# Patient Record
Sex: Female | Born: 1975 | Race: Black or African American | Hispanic: No | Marital: Married | Smoking: Never smoker
Health system: Southern US, Community
[De-identification: ages and names within clinical notes are randomized; demographics above are authoritative.]

## PROBLEM LIST (undated history)

## (undated) ENCOUNTER — Inpatient Hospital Stay (HOSPITAL_COMMUNITY): Payer: Self-pay

## (undated) DIAGNOSIS — G8929 Other chronic pain: Secondary | ICD-10-CM

## (undated) DIAGNOSIS — A6000 Herpesviral infection of urogenital system, unspecified: Secondary | ICD-10-CM

## (undated) DIAGNOSIS — G709 Myoneural disorder, unspecified: Secondary | ICD-10-CM

## (undated) DIAGNOSIS — D649 Anemia, unspecified: Secondary | ICD-10-CM

## (undated) DIAGNOSIS — O24419 Gestational diabetes mellitus in pregnancy, unspecified control: Secondary | ICD-10-CM

## (undated) DIAGNOSIS — E119 Type 2 diabetes mellitus without complications: Secondary | ICD-10-CM

## (undated) DIAGNOSIS — M1990 Unspecified osteoarthritis, unspecified site: Secondary | ICD-10-CM

## (undated) DIAGNOSIS — O1490 Unspecified pre-eclampsia, unspecified trimester: Secondary | ICD-10-CM

## (undated) DIAGNOSIS — G971 Other reaction to spinal and lumbar puncture: Secondary | ICD-10-CM

## (undated) DIAGNOSIS — G629 Polyneuropathy, unspecified: Secondary | ICD-10-CM

## (undated) DIAGNOSIS — O139 Gestational [pregnancy-induced] hypertension without significant proteinuria, unspecified trimester: Secondary | ICD-10-CM

## (undated) DIAGNOSIS — T7840XA Allergy, unspecified, initial encounter: Secondary | ICD-10-CM

## (undated) HISTORY — DX: Type 2 diabetes mellitus without complications: E11.9

## (undated) HISTORY — DX: Myoneural disorder, unspecified: G70.9

## (undated) HISTORY — DX: Unspecified osteoarthritis, unspecified site: M19.90

## (undated) HISTORY — DX: Other reaction to spinal and lumbar puncture: G97.1

## (undated) HISTORY — DX: Allergy, unspecified, initial encounter: T78.40XA

## (undated) HISTORY — PX: DILATION AND CURETTAGE OF UTERUS: SHX78

## (undated) HISTORY — DX: Unspecified pre-eclampsia, unspecified trimester: O14.90

---
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
## 1998-10-20 ENCOUNTER — Emergency Department (HOSPITAL_COMMUNITY): Admission: EM | Admit: 1998-10-20 | Discharge: 1998-10-21 | Payer: Self-pay

## 1999-09-27 ENCOUNTER — Emergency Department (HOSPITAL_COMMUNITY): Admission: EM | Admit: 1999-09-27 | Discharge: 1999-09-27 | Payer: Self-pay | Admitting: Emergency Medicine

## 1999-09-28 ENCOUNTER — Emergency Department (HOSPITAL_COMMUNITY): Admission: EM | Admit: 1999-09-28 | Discharge: 1999-09-28 | Payer: Self-pay | Admitting: Emergency Medicine

## 2005-02-19 ENCOUNTER — Other Ambulatory Visit: Admission: RE | Admit: 2005-02-19 | Discharge: 2005-02-19 | Payer: Self-pay | Admitting: Family Medicine

## 2007-09-06 ENCOUNTER — Emergency Department (HOSPITAL_COMMUNITY): Admission: EM | Admit: 2007-09-06 | Discharge: 2007-09-06 | Payer: Self-pay | Admitting: Emergency Medicine

## 2009-08-12 ENCOUNTER — Emergency Department (HOSPITAL_COMMUNITY): Admission: EM | Admit: 2009-08-12 | Discharge: 2009-08-12 | Payer: Self-pay | Admitting: Emergency Medicine

## 2009-08-31 ENCOUNTER — Emergency Department (HOSPITAL_COMMUNITY): Admission: EM | Admit: 2009-08-31 | Discharge: 2009-08-31 | Payer: Self-pay | Admitting: Emergency Medicine

## 2011-03-09 LAB — URINALYSIS, ROUTINE W REFLEX MICROSCOPIC
Bilirubin Urine: NEGATIVE
Ketones, ur: NEGATIVE mg/dL
Protein, ur: NEGATIVE mg/dL
Urobilinogen, UA: 0.2 mg/dL (ref 0.0–1.0)

## 2011-03-09 LAB — GC/CHLAMYDIA PROBE AMP, GENITAL: GC Probe Amp, Genital: NEGATIVE

## 2011-03-09 LAB — WET PREP, GENITAL
WBC, Wet Prep HPF POC: NONE SEEN
Yeast Wet Prep HPF POC: NONE SEEN

## 2011-09-05 ENCOUNTER — Emergency Department (HOSPITAL_BASED_OUTPATIENT_CLINIC_OR_DEPARTMENT_OTHER)
Admission: EM | Admit: 2011-09-05 | Discharge: 2011-09-05 | Disposition: A | Discharge status: Home / Self Care | Payer: Self-pay | Attending: Emergency Medicine | Admitting: Emergency Medicine

## 2011-09-05 ENCOUNTER — Encounter: Payer: Self-pay | Admitting: *Deleted

## 2011-09-05 DIAGNOSIS — T63481A Toxic effect of venom of other arthropod, accidental (unintentional), initial encounter: Secondary | ICD-10-CM

## 2011-09-05 DIAGNOSIS — T63461A Toxic effect of venom of wasps, accidental (unintentional), initial encounter: Secondary | ICD-10-CM | POA: Insufficient documentation

## 2011-09-05 DIAGNOSIS — T6391XA Toxic effect of contact with unspecified venomous animal, accidental (unintentional), initial encounter: Secondary | ICD-10-CM | POA: Insufficient documentation

## 2011-09-05 MED ORDER — PREDNISONE 10 MG PO TABS: ORAL_TABLET | ORAL | Status: DC

## 2011-09-05 MED ORDER — DIPHENHYDRAMINE HCL 50 MG PO CAPS
50.0000 mg | ORAL_CAPSULE | Freq: Once | ORAL | Status: AC
  Administered 2011-09-05: 50 mg via ORAL
  Filled 2011-09-05: qty 1

## 2011-09-05 MED ORDER — PREDNISONE 50 MG PO TABS
60.0000 mg | ORAL_TABLET | Freq: Every day | ORAL | Status: DC
  Administered 2011-09-05: 60 mg via ORAL

## 2011-09-05 NOTE — ED Provider Notes (Signed)
Medical screening examination/treatment/procedure(s) were performed by non-physician practitioner and as supervising physician I was immediately available for consultation/collaboration.   Glynn Octave, MD 09/05/11 2224

## 2011-09-05 NOTE — ED Provider Notes (Signed)
History     CSN: [id number] Arrival date & time: 09/05/2011  5:56 PM  Chief Complaint  Patient presents with  . Insect Bite    (Consider location/radiation/quality/duration/timing/severity/associated sxs/prior treatment) Patient is a 35 y.o. female presenting with neck injury. The history is provided by the patient. No language interpreter was used.  Neck Injury This is a new problem. The current episode started today. The problem occurs constantly. The problem has been unchanged. Associated symptoms include a rash. The symptoms are aggravated by nothing. She has tried nothing for the symptoms. The treatment provided no relief.  Pt was stung by a bee.  Pt has a knot on her neck.  Pt complains of a swollen area to right neck,  Pt reports she was stung by a bee in the past and had swelling to left knee.  History reviewed. No pertinent past medical history.  History reviewed. No pertinent past surgical history.  History reviewed. No pertinent family history.  History  Substance Use Topics  . Smoking status: Never Smoker   . Smokeless tobacco: Not on file  . Alcohol Use: No    OB History    Grav Para Term Preterm Abortions TAB SAB Ect Mult Living                  Review of Systems  Skin: Positive for rash.  All other systems reviewed and are negative.    Allergies  Penicillins  Home Medications   Current Outpatient Rx  Name Route Sig Dispense Refill  . BIOTIN 5000 MCG PO CAPS Oral Take 1 capsule by mouth daily.        BP 137/82  Temp 98.5 F (36.9 C)  Resp 16  Ht 5\' 7"  (1.702 m)  Wt 223 lb (101.152 kg)  BMI 34.93 kg/m2  SpO2 100%  LMP 09/05/2011  Physical Exam  Nursing note and vitals reviewed. Constitutional: She is oriented to person, place, and time. She appears well-developed and well-nourished.  HENT:  Head: Normocephalic.  Eyes: Pupils are equal, round, and reactive to light.  Neck: Normal range of motion.  Cardiovascular: Normal rate.     Pulmonary/Chest: Effort normal.  Abdominal: Soft.  Musculoskeletal: Normal range of motion.  Neurological: She is alert and oriented to person, place, and time.  Skin:       1 x2 cm area of erythema to right neck,   Psychiatric: She has a normal mood and affect.    ED Course  Procedures (including critical care time)  Labs Reviewed - No data to display No results found.   No diagnosis found.    MDM  Pt given benadryl and prednisone,  Pt observed x 1 hour.  Pt feels well,  No rash, no shortness of breath.  No throat swelling.        Langston Masker, [place] 09/05/11 1925  Langston Masker, [place] 09/05/11 [phone]

## 2011-09-05 NOTE — ED Notes (Signed)
Pt c/o bee sting to neck x 10 mins ago

## 2011-09-13 LAB — URINE MICROSCOPIC-ADD ON

## 2011-09-13 LAB — GC/CHLAMYDIA PROBE AMP, GENITAL
Chlamydia, DNA Probe: POSITIVE — AB
GC Probe Amp, Genital: NEGATIVE

## 2011-09-13 LAB — URINALYSIS, ROUTINE W REFLEX MICROSCOPIC
Glucose, UA: NEGATIVE
Specific Gravity, Urine: 1.029
pH: 6

## 2011-09-13 LAB — WET PREP, GENITAL
Clue Cells Wet Prep HPF POC: NONE SEEN
Trich, Wet Prep: NONE SEEN
Yeast Wet Prep HPF POC: NONE SEEN

## 2011-09-13 LAB — RPR: RPR Ser Ql: NONREACTIVE

## 2012-01-23 ENCOUNTER — Encounter (HOSPITAL_BASED_OUTPATIENT_CLINIC_OR_DEPARTMENT_OTHER): Payer: Self-pay | Admitting: Emergency Medicine

## 2012-01-23 ENCOUNTER — Emergency Department (HOSPITAL_BASED_OUTPATIENT_CLINIC_OR_DEPARTMENT_OTHER)
Admission: EM | Admit: 2012-01-23 | Discharge: 2012-01-23 | Disposition: A | Discharge status: Home Health | Payer: Self-pay | Attending: Emergency Medicine | Admitting: Emergency Medicine

## 2012-01-23 DIAGNOSIS — B9789 Other viral agents as the cause of diseases classified elsewhere: Secondary | ICD-10-CM | POA: Insufficient documentation

## 2012-01-23 DIAGNOSIS — B349 Viral infection, unspecified: Secondary | ICD-10-CM

## 2012-01-23 DIAGNOSIS — R112 Nausea with vomiting, unspecified: Secondary | ICD-10-CM | POA: Insufficient documentation

## 2012-01-23 LAB — RAPID STREP SCREEN (MED CTR MEBANE ONLY): Streptococcus, Group A Screen (Direct): NEGATIVE

## 2012-01-23 MED ORDER — ONDANSETRON 8 MG PO TBDP: 8.0000 mg | ORAL_TABLET | Freq: Three times a day (TID) | ORAL | Status: AC | PRN

## 2012-01-23 MED ORDER — ONDANSETRON 8 MG PO TBDP
8.0000 mg | ORAL_TABLET | Freq: Once | ORAL | Status: AC
  Administered 2012-01-23: 8 mg via ORAL
  Filled 2012-01-23: qty 1

## 2012-01-23 NOTE — ED Notes (Signed)
Generalized body aches, sore throat, lightheadedness, fever, N/V/D.  Nasal congestion, dry cough.  Poor appetite, can keep most fluids down.

## 2012-01-23 NOTE — ED Provider Notes (Signed)
History     CSN: [id number]  Arrival date & time 01/23/12  1241   First MD Initiated Contact with Patient 01/23/12 1301      Chief Complaint  Patient presents with  . Influenza    (Consider location/radiation/quality/duration/timing/severity/associated sxs/prior treatment) HPI Patient states that she has had generalized body aches, sore throat, lightheadedness, fever, nausea, vomiting, diarrhea, nasal congestion, and cough for 2 days. She has had difficulty keeping down solid foods but has been able to keep down fluids. She has not had any known fever but has had some chills.     History reviewed. No pertinent past medical history.  History reviewed. No pertinent past surgical history.  History reviewed. No pertinent family history.  History  Substance Use Topics  . Smoking status: Never Smoker   . Smokeless tobacco: Not on file  . Alcohol Use: No    OB History    Grav Para Term Preterm Abortions TAB SAB Ect Mult Living                  Review of Systems  All other systems reviewed and are negative.    Allergies  Penicillins  Home Medications   Current Outpatient Rx  Name Route Sig Dispense Refill  . BIOTIN 5000 MCG PO CAPS Oral Take 1 capsule by mouth daily.      Marland Kitchen PREDNISONE 10 MG PO TABS  6 tablets a day x 3 days. 15 tablet 0    BP 124/84  Pulse 73  Temp(Src) 98.3 F (36.8 C) (Oral)  Resp 16  Ht 5\' 7"  (1.702 m)  Wt 230 lb (104.327 kg)  BMI 36.02 kg/m2  SpO2 99%  LMP 01/19/2012  Physical Exam  Nursing note and vitals reviewed. Constitutional: She is oriented to person, place, and time. She appears well-developed and well-nourished.  HENT:  Head: Normocephalic and atraumatic.  Right Ear: External ear normal.  Left Ear: External ear normal.  Nose: Nose normal.       Bilateral pleural tonsillar swelling with some erythema  Eyes: Conjunctivae and EOM are normal. Pupils are equal, round, and reactive to light.  Neck: Normal range of motion.  Neck supple.  Cardiovascular: Normal rate and regular rhythm.   Pulmonary/Chest: Effort normal and breath sounds normal.  Abdominal: Soft.  Musculoskeletal: Normal range of motion.  Neurological: She is alert and oriented to person, place, and time.  Skin: Skin is warm and dry.  Psychiatric: She has a normal mood and affect.    ED Course  Procedures (including critical care time)   Labs Reviewed  RAPID STREP SCREEN   No results found.   No diagnosis found.    MDM  Patient was negative strep. The be treated for viral syndrome with symptomatic treatment.        Hilario Quarry, MD 01/23/12 1350

## 2012-01-23 NOTE — Discharge Instructions (Signed)
Antibiotic Nonuse  Your caregiver felt that the infection or problem was not one that would be helped with an antibiotic. Infections may be caused by viruses or bacteria. Only a caregiver can tell which one of these is the likely cause of an illness. A cold is the most common cause of infection in both adults and children. A cold is a virus. Antibiotic treatment will have no effect on a viral infection. Viruses can lead to many lost days of work caring for sick children and many missed days of school. Children may catch as many as 10 "colds" or "flus" per year during which they can be tearful, cranky, and uncomfortable. The goal of treating a virus is aimed at keeping the ill person comfortable. Antibiotics are medications used to help the body fight bacterial infections. There are relatively few types of bacteria that cause infections but there are hundreds of viruses. While both viruses and bacteria cause infection they are very different types of germs. A viral infection will typically go away by itself within 7 to 10 days. Bacterial infections may spread or get worse without antibiotic treatment. Examples of bacterial infections are:  Sore throats (like strep throat or tonsillitis).   Infection in the lung (pneumonia).   Ear and skin infections.  Examples of viral infections are:  Colds or flus.   Most coughs and bronchitis.   Sore throats not caused by Strep.   Runny noses.  It is often best not to take an antibiotic when a viral infection is the cause of the problem. Antibiotics can kill off the helpful bacteria that we have inside our body and allow harmful bacteria to start growing. Antibiotics can cause side effects such as allergies, nausea, and diarrhea without helping to improve the symptoms of the viral infection. Additionally, repeated uses of antibiotics can cause bacteria inside of our body to become resistant. That resistance can be passed onto harmful bacterial. The next time  you have an infection it may be harder to treat if antibiotics are used when they are not needed. Not treating with antibiotics allows our own immune system to develop and take care of infections more efficiently. Also, antibiotics will work better for us when they are prescribed for bacterial infections. Treatments for a child that is ill may include:  Give extra fluids throughout the day to stay hydrated.   Get plenty of rest.   Only give your child over-the-counter or prescription medicines for pain, discomfort, or fever as directed by your caregiver.   The use of a cool mist humidifier may help stuffy noses.   Cold medications if suggested by your caregiver.  Your caregiver may decide to start you on an antibiotic if:  The problem you were seen for today continues for a longer length of time than expected.   You develop a secondary bacterial infection.  SEEK MEDICAL CARE IF:  Fever lasts longer than 5 days.   Symptoms continue to get worse after 5 to 7 days or become severe.   Difficulty in breathing develops.   Signs of dehydration develop (poor drinking, rare urinating, dark colored urine).   Changes in behavior or worsening tiredness (listlessness or lethargy).  Document Released: 01/28/2002 Document Revised: 08/01/2011 Document Reviewed: 07/27/2009 [employer] Patient Information 2012 [employer].Viral Infections A viral infection can be caused by different types of viruses.Most viral infections are not serious and resolve on their own. However, some infections may cause severe symptoms and may lead to further complications. SYMPTOMS Viruses   can frequently cause:  Minor sore throat.   Aches and pains.   Headaches.   Runny nose.   Different types of rashes.   Watery eyes.   Tiredness.   Cough.   Loss of appetite.   Gastrointestinal infections, resulting in nausea, vomiting, and diarrhea.  These symptoms do not respond to antibiotics because the infection  is not caused by bacteria. However, you might catch a bacterial infection following the viral infection. This is sometimes called a "superinfection." Symptoms of such a bacterial infection may include:  Worsening sore throat with pus and difficulty swallowing.   Swollen neck glands.   Chills and a high or persistent fever.   Severe headache.   Tenderness over the sinuses.   Persistent overall ill feeling (malaise), muscle aches, and tiredness (fatigue).   Persistent cough.   Yellow, green, or brown mucus production with coughing.  HOME CARE INSTRUCTIONS   Only take over-the-counter or prescription medicines for pain, discomfort, diarrhea, or fever as directed by your caregiver.   Drink enough water and fluids to keep your urine clear or pale yellow. Sports drinks can provide valuable electrolytes, sugars, and hydration.   Get plenty of rest and maintain proper nutrition. Soups and broths with crackers or rice are fine.  SEEK IMMEDIATE MEDICAL CARE IF:   You have severe headaches, shortness of breath, chest pain, neck pain, or an unusual rash.   You have uncontrolled vomiting, diarrhea, or you are unable to keep down fluids.   You or your child has an oral temperature above 102 F (38.9 C), not controlled by medicine.   Your baby is older than 3 months with a rectal temperature of 102 F (38.9 C) or higher.   Your baby is 3 months old or younger with a rectal temperature of 100.4 F (38 C) or higher.  MAKE SURE YOU:   Understand these instructions.   Will watch your condition.   Will get help right away if you are not doing well or get worse.  Document Released: 08/29/2005 Document Revised: 08/01/2011 Document Reviewed: 03/26/2011 [employer] Patient Information 2012 [employer]. 

## 2012-03-06 ENCOUNTER — Emergency Department (HOSPITAL_BASED_OUTPATIENT_CLINIC_OR_DEPARTMENT_OTHER)
Admission: EM | Admit: 2012-03-06 | Discharge: 2012-03-06 | Disposition: A | Discharge status: Home / Self Care | Payer: Self-pay | Attending: Emergency Medicine | Admitting: Emergency Medicine

## 2012-03-06 ENCOUNTER — Encounter (HOSPITAL_BASED_OUTPATIENT_CLINIC_OR_DEPARTMENT_OTHER): Payer: Self-pay

## 2012-03-06 DIAGNOSIS — J329 Chronic sinusitis, unspecified: Secondary | ICD-10-CM | POA: Insufficient documentation

## 2012-03-06 MED ORDER — SULFAMETHOXAZOLE-TRIMETHOPRIM 800-160 MG PO TABS: 1.0000 | ORAL_TABLET | Freq: Two times a day (BID) | ORAL | Status: AC

## 2012-03-06 NOTE — Discharge Instructions (Signed)

## 2012-03-06 NOTE — ED Notes (Signed)
Patient states that she has had head congestion with sinus pressure, runny nose, headache. Nasal discharge orange in color with streaks of blood. Patient has tried tussin with mucus and cough relief with no relief. Patient states that she has shortness of breath all the time.

## 2012-03-06 NOTE — ED Provider Notes (Signed)
History     CSN: [id number]  Arrival date & time 03/06/12  1055   First MD Initiated Contact with Patient 03/06/12 1158      Chief Complaint  Patient presents with  . Nasal Congestion    (Consider location/radiation/quality/duration/timing/severity/associated sxs/prior treatment) HPI Comments: Pt states that she feels sob but it is in her throat:pt states that has tried otc medications without relief  Patient is a 36 y.o. female presenting with URI. The history is provided by the patient and the spouse. No language interpreter was used.  URI The primary symptoms include headaches and cough. Primary symptoms do not include fever. The current episode started 6 to 7 days ago. This is a new problem. The problem has not changed since onset. Symptoms associated with the illness include facial pain, sinus pressure and congestion.    History reviewed. No pertinent past medical history.  History reviewed. No pertinent past surgical history.  History reviewed. No pertinent family history.  History  Substance Use Topics  . Smoking status: Never Smoker   . Smokeless tobacco: Not on file  . Alcohol Use: Yes     socially    OB History    Grav Para Term Preterm Abortions TAB SAB Ect Mult Living                  Review of Systems  Constitutional: Negative.  Negative for fever.  HENT: Positive for congestion and sinus pressure.   Eyes: Negative.   Respiratory: Positive for cough.   Cardiovascular: Negative.   Neurological: Positive for headaches.    Allergies  Penicillins  Home Medications   Current Outpatient Rx  Name Route Sig Dispense Refill  . BIOTIN 5000 MCG PO CAPS Oral Take 1 capsule by mouth daily.      Marland Kitchen PREDNISONE 10 MG PO TABS  6 tablets a day x 3 days. 15 tablet 0  . SULFAMETHOXAZOLE-TRIMETHOPRIM 800-160 MG PO TABS Oral Take 1 tablet by mouth every 12 (twelve) hours. 20 tablet 0    BP 116/81  Pulse 78  Temp(Src) 97.5 F (36.4 C) (Oral)  Resp 20  Ht 5\' 8"   (1.727 m)  Wt 235 lb (106.595 kg)  BMI 35.73 kg/m2  SpO2 100%  LMP 03/04/2012  Physical Exam  Nursing note and vitals reviewed. Constitutional: She appears well-developed and well-nourished.  HENT:  Right Ear: External ear normal.  Left Ear: External ear normal.  Nose: Mucosal edema present. Left sinus exhibits frontal sinus tenderness.  Mouth/Throat: Posterior oropharyngeal erythema present.  Eyes: EOM are normal.  Neck: Neck supple.  Cardiovascular: Normal rate and regular rhythm.   Pulmonary/Chest: Effort normal and breath sounds normal.    ED Course  Procedures (including critical care time)  Labs Reviewed - No data to display No results found.   1. Sinusitis       MDM  Will treat for sinusitis       Teressa Lower, NP 03/06/12 1230

## 2012-03-06 NOTE — ED Provider Notes (Signed)
Medical screening examination/treatment/procedure(s) were performed by non-physician practitioner and as supervising physician I was immediately available for consultation/collaboration.   Arneta Mahmood, MD 03/06/12 1626 

## 2012-06-19 ENCOUNTER — Emergency Department (HOSPITAL_BASED_OUTPATIENT_CLINIC_OR_DEPARTMENT_OTHER)
Admission: EM | Admit: 2012-06-19 | Discharge: 2012-06-19 | Disposition: A | Discharge status: Home / Self Care | Payer: Self-pay | Attending: Emergency Medicine | Admitting: Emergency Medicine

## 2012-06-19 ENCOUNTER — Encounter (HOSPITAL_BASED_OUTPATIENT_CLINIC_OR_DEPARTMENT_OTHER): Payer: Self-pay | Admitting: Emergency Medicine

## 2012-06-19 DIAGNOSIS — M545 Low back pain, unspecified: Secondary | ICD-10-CM | POA: Insufficient documentation

## 2012-06-19 DIAGNOSIS — N390 Urinary tract infection, site not specified: Secondary | ICD-10-CM

## 2012-06-19 DIAGNOSIS — M549 Dorsalgia, unspecified: Secondary | ICD-10-CM

## 2012-06-19 LAB — URINALYSIS, ROUTINE W REFLEX MICROSCOPIC
Bilirubin Urine: NEGATIVE
Nitrite: NEGATIVE
Protein, ur: NEGATIVE mg/dL
Specific Gravity, Urine: 1.026 (ref 1.005–1.030)
Urobilinogen, UA: 0.2 mg/dL (ref 0.0–1.0)

## 2012-06-19 LAB — PREGNANCY, URINE: Preg Test, Ur: NEGATIVE

## 2012-06-19 LAB — URINE MICROSCOPIC-ADD ON

## 2012-06-19 MED ORDER — OXYCODONE-ACETAMINOPHEN 5-325 MG PO TABS: 1.0000 | ORAL_TABLET | ORAL | Status: AC | PRN

## 2012-06-19 MED ORDER — CEPHALEXIN 500 MG PO CAPS: 500.0000 mg | ORAL_CAPSULE | Freq: Four times a day (QID) | ORAL | Status: AC

## 2012-06-19 NOTE — ED Provider Notes (Signed)
History     CSN: [id number]  Arrival date & time 06/19/12  1545   First MD Initiated Contact with Patient 06/19/12 1633    room 6 [facility]  Chief Complaint  Patient presents with  . Back Pain    (Consider location/radiation/quality/duration/timing/severity/associated sxs/prior treatment) HPI  Patient with sharp low back pain bilateral back for two days.  Occurs daily, last hours,  Sharp with some radiation.  No change with movement, no injury.  Denies uti symptoms, no fever, chills, or vomiting.  Patient had normal menstrual period last week.   History reviewed. No pertinent past medical history.  History reviewed. No pertinent past surgical history.  No family history on file.  History  Substance Use Topics  . Smoking status: Never Smoker   . Smokeless tobacco: Not on file  . Alcohol Use: Yes     socially    OB History    Grav Para Term Preterm Abortions TAB SAB Ect Mult Living                  Review of Systems  All other systems reviewed and are negative.    Allergies  Penicillins  Home Medications   Current Outpatient Rx  Name Route Sig Dispense Refill  . BIOTIN 5000 MCG PO CAPS Oral Take 1 capsule by mouth daily.      Marland Kitchen PREDNISONE 10 MG PO TABS  6 tablets a day x 3 days. 15 tablet 0    BP 119/68  Pulse 83  Temp 97.8 F (36.6 C) (Oral)  Resp 16  SpO2 100%  LMP 06/11/2012  Physical Exam  Nursing note and vitals reviewed. Constitutional: She appears well-developed and well-nourished.  HENT:  Head: Normocephalic and atraumatic.  Eyes: Conjunctivae and EOM are normal. Pupils are equal, round, and reactive to light.  Neck: Normal range of motion. Neck supple.  Cardiovascular: Normal rate, regular rhythm, normal heart sounds and intact distal pulses.   Pulmonary/Chest: Effort normal and breath sounds normal.  Abdominal: Soft. Bowel sounds are normal.  Musculoskeletal: Normal range of motion.  Neurological: She is alert.  Skin: Skin is warm and dry.   Psychiatric: She has a normal mood and affect. Thought content normal.    ED Course  Procedures (including critical care time)  Labs Reviewed  URINALYSIS, ROUTINE W REFLEX MICROSCOPIC - Abnormal; Notable for the following:    APPearance CLOUDY (*)     Hgb urine dipstick SMALL (*)     Leukocytes, UA MODERATE (*)     All other components within normal limits  URINE MICROSCOPIC-ADD ON - Abnormal; Notable for the following:    Squamous Epithelial / LPF FEW (*)     Bacteria, UA MANY (*)     All other components within normal limits  PREGNANCY, URINE   No results found.   No diagnosis found.    MDM  Patient with possible uti, plan keflex and pain meds prn.         Hilario Quarry, MD 06/19/12 [phone]

## 2012-06-19 NOTE — ED Notes (Signed)
Pt c/o sharp shooting pain from lower back, radiating to neck at times since last pm

## 2012-10-11 ENCOUNTER — Encounter (HOSPITAL_BASED_OUTPATIENT_CLINIC_OR_DEPARTMENT_OTHER): Payer: Self-pay | Admitting: *Deleted

## 2012-10-11 ENCOUNTER — Emergency Department (HOSPITAL_BASED_OUTPATIENT_CLINIC_OR_DEPARTMENT_OTHER)
Admission: EM | Admit: 2012-10-11 | Discharge: 2012-10-11 | Disposition: A | Discharge status: Home / Self Care | Payer: Self-pay | Attending: Emergency Medicine | Admitting: Emergency Medicine

## 2012-10-11 DIAGNOSIS — M25549 Pain in joints of unspecified hand: Secondary | ICD-10-CM | POA: Insufficient documentation

## 2012-10-11 DIAGNOSIS — M79643 Pain in unspecified hand: Secondary | ICD-10-CM

## 2012-10-11 DIAGNOSIS — Z79899 Other long term (current) drug therapy: Secondary | ICD-10-CM | POA: Insufficient documentation

## 2012-10-11 LAB — GLUCOSE, CAPILLARY: Glucose-Capillary: 91 mg/dL (ref 70–99)

## 2012-10-11 MED ORDER — IBUPROFEN 800 MG PO TABS
800.0000 mg | ORAL_TABLET | Freq: Once | ORAL | Status: AC
  Administered 2012-10-11: 800 mg via ORAL
  Filled 2012-10-11: qty 1

## 2012-10-11 MED ORDER — IBUPROFEN 800 MG PO TABS: 800.0000 mg | ORAL_TABLET | Freq: Three times a day (TID) | ORAL | Status: AC

## 2012-10-11 NOTE — ED Provider Notes (Signed)
History     CSN: [id number]  Arrival date & time 10/11/12  8295   First MD Initiated Contact with Patient 10/11/12 0914      Chief Complaint  Patient presents with  . Hand Pain    (Consider location/radiation/quality/duration/timing/severity/associated sxs/prior treatment) HPI The patient presents with concerns of bilateral thumb dysesthesia.  She states that her symptoms began several days ago, insidiously.  Since onset symptoms of the more pronounced on the right than the left.  She notes that on the palmar aspect of the thumb as she feels off.  The sensation radiates proximally more on the right than the left.  There is no pain sensation and there is no inability to graft or other range of motion limitation.  There is no concurrent fever, chills, other notable complaints. 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  She has had no relief with home OTC medication use.  She states that apply a wrist wrap made her symptoms worse. History reviewed. No pertinent past medical history.  History reviewed. No pertinent past surgical history.  History reviewed. No pertinent family history.  History  Substance Use Topics  . Smoking status: Never Smoker   . Smokeless tobacco: Not on file  . Alcohol Use: Yes     Comment: socially    OB History    Grav Para Term Preterm Abortions TAB SAB Ect Mult Living                  Review of Systems  All other systems reviewed and are negative.    Allergies  Penicillins  Home Medications   Current Outpatient Rx  Name  Route  Sig  Dispense  Refill  . BIOTIN 5000 MCG PO CAPS   Oral   Take 1 capsule by mouth daily.           Marland Kitchen PREDNISONE 10 MG PO TABS      6 tablets a day x 3 days.   15 tablet   0     BP 116/77  Pulse 60  Temp 98.4 F (36.9 C)  Resp 18  SpO2 100%  Physical Exam  Nursing note and vitals reviewed. Constitutional: She appears well-developed and well-nourished. No distress.    HENT:  Head: Normocephalic and atraumatic.  Eyes: Conjunctivae normal are normal. Right eye exhibits no discharge. Left eye exhibits no discharge.  Neck: No tracheal deviation present.  Cardiovascular: Normal rate, regular rhythm and intact distal pulses.   No murmur heard. Pulmonary/Chest: Effort normal and breath sounds normal. No stridor. No respiratory distress.  Musculoskeletal:       Right wrist: She exhibits tenderness.       Arms:      Feet:       Range of motion and strength is appropriate and both wrists and all digits bilaterally.  There is no appreciable deformity, erythema, edema throughout.  Skin: She is not diaphoretic.    ED Course  Procedures (including critical care time)  Labs Reviewed - No data to display No results found.   No diagnosis found.    MDM  This generally well-appearing female presents with days of bilateral thumb and wrist discomfort.  On exam she is in no distress.  The patient's vital signs are unremarkable.  Given some suspicion of neuropathy, a point-of-care glucose test was performed, this was unremarkable.  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  I discussed the findings with the patient.  She was discharged in stable condition on a course of anti-inflammatories, instructions to use cryotherapy.  She was provided resources to assist with primary care followup, for further evaluation and management of her thumb and wrist discomfort.    Gerhard Munch, MD 10/11/12 [phone]

## 2012-10-11 NOTE — ED Notes (Signed)
The patient's CBG was 91.

## 2012-10-11 NOTE — ED Notes (Signed)
Pt presents to ED today with left thumb and hand pain and numbness extending into wrist.  Pt also c/o decreasaed ROM with right wrist  Pt reports no injury bilat

## 2012-11-18 ENCOUNTER — Encounter (HOSPITAL_BASED_OUTPATIENT_CLINIC_OR_DEPARTMENT_OTHER): Payer: Self-pay | Admitting: *Deleted

## 2012-11-18 ENCOUNTER — Emergency Department (HOSPITAL_BASED_OUTPATIENT_CLINIC_OR_DEPARTMENT_OTHER): Payer: Self-pay

## 2012-11-18 ENCOUNTER — Emergency Department (HOSPITAL_BASED_OUTPATIENT_CLINIC_OR_DEPARTMENT_OTHER)
Admission: EM | Admit: 2012-11-18 | Discharge: 2012-11-18 | Disposition: A | Discharge status: Home / Self Care | Payer: Self-pay | Attending: Emergency Medicine | Admitting: Emergency Medicine

## 2012-11-18 DIAGNOSIS — R6889 Other general symptoms and signs: Secondary | ICD-10-CM | POA: Insufficient documentation

## 2012-11-18 DIAGNOSIS — R509 Fever, unspecified: Secondary | ICD-10-CM | POA: Insufficient documentation

## 2012-11-18 DIAGNOSIS — J029 Acute pharyngitis, unspecified: Secondary | ICD-10-CM | POA: Insufficient documentation

## 2012-11-18 DIAGNOSIS — J111 Influenza due to unidentified influenza virus with other respiratory manifestations: Secondary | ICD-10-CM | POA: Insufficient documentation

## 2012-11-18 DIAGNOSIS — A599 Trichomoniasis, unspecified: Secondary | ICD-10-CM | POA: Insufficient documentation

## 2012-11-18 DIAGNOSIS — R110 Nausea: Secondary | ICD-10-CM | POA: Insufficient documentation

## 2012-11-18 LAB — URINALYSIS, ROUTINE W REFLEX MICROSCOPIC
Glucose, UA: NEGATIVE mg/dL
Nitrite: NEGATIVE
Protein, ur: 30 mg/dL — AB

## 2012-11-18 LAB — URINE MICROSCOPIC-ADD ON

## 2012-11-18 MED ORDER — ACETAMINOPHEN 325 MG PO TABS
650.0000 mg | ORAL_TABLET | Freq: Once | ORAL | Status: AC
  Administered 2012-11-18: 650 mg via ORAL

## 2012-11-18 MED ORDER — OSELTAMIVIR PHOSPHATE 75 MG PO CAPS: 75.0000 mg | ORAL_CAPSULE | Freq: Two times a day (BID) | ORAL | Status: DC

## 2012-11-18 MED ORDER — ACETAMINOPHEN 325 MG PO TABS
ORAL_TABLET | ORAL | Status: AC
  Administered 2012-11-18: 650 mg via ORAL
  Filled 2012-11-18: qty 2

## 2012-11-18 MED ORDER — METRONIDAZOLE 500 MG PO TABS: 500.0000 mg | ORAL_TABLET | Freq: Two times a day (BID) | ORAL | Status: DC

## 2012-11-18 NOTE — ED Notes (Signed)
Body aches with naus

## 2012-11-18 NOTE — ED Notes (Signed)
MD at bedside. 

## 2012-11-18 NOTE — ED Notes (Signed)
Patient transported to X-ray 

## 2012-11-18 NOTE — ED Provider Notes (Signed)
History     CSN: [id number]  Arrival date & time 11/18/12  1435   First MD Initiated Contact with Patient 11/18/12 1526      Chief Complaint  Patient presents with  . Generalized Body Aches    (Consider location/radiation/quality/duration/timing/severity/associated sxs/prior treatment) Patient is a 36 y.o. female presenting with flu symptoms. The history is provided by the patient.  Influenza This is a new problem. The current episode started 6 to 12 hours ago. The problem occurs constantly. The problem has been gradually worsening. Pertinent negatives include no chest pain, no abdominal pain and no shortness of breath. Associated symptoms comments: Fever, cough, sore throat, nausea.  No SOB, HA, rash, chest pain or abd pain. Nothing aggravates the symptoms. Nothing relieves the symptoms. She has tried nothing for the symptoms. The treatment provided no relief.    History reviewed. No pertinent past medical history.  History reviewed. No pertinent past surgical history.  History reviewed. No pertinent family history.  History  Substance Use Topics  . Smoking status: Never Smoker   . Smokeless tobacco: Not on file  . Alcohol Use: Yes     Comment: socially    OB History    Grav Para Term Preterm Abortions TAB SAB Ect Mult Living                  Review of Systems  Constitutional: Positive for fever, chills and appetite change.  HENT: Positive for sore throat and sneezing. Negative for congestion, rhinorrhea and trouble swallowing.   Respiratory: Negative for shortness of breath.   Cardiovascular: Negative for chest pain.  Gastrointestinal: Positive for nausea. Negative for vomiting, abdominal pain and diarrhea.  Genitourinary: Negative for dysuria and hematuria.  Skin: Negative for rash.  All other systems reviewed and are negative.    Allergies  Penicillins  Home Medications   Current Outpatient Rx  Name  Route  Sig  Dispense  Refill  . BIOTIN 5000 MCG PO  CAPS   Oral   Take 1 capsule by mouth daily.             BP 112/73  Pulse 117  Temp 102.4 F (39.1 C) (Oral)  Resp 24  Ht 5\' 7"  (1.702 m)  Wt 237 lb (107.502 kg)  BMI 37.12 kg/m2  SpO2 99%  Physical Exam  Nursing note and vitals reviewed. Constitutional: She is oriented to person, place, and time. She appears well-developed and well-nourished. No distress.  HENT:  Head: Normocephalic and atraumatic.  Mouth/Throat: Oropharynx is clear and moist.  Eyes: Conjunctivae normal and EOM are normal. Pupils are equal, round, and reactive to light.  Neck: Normal range of motion. Neck supple.  Cardiovascular: Regular rhythm and intact distal pulses.  Tachycardia present.   No murmur heard. Pulmonary/Chest: Effort normal and breath sounds normal. No respiratory distress. She has no wheezes. She has no rales.  Abdominal: Soft. She exhibits no distension. There is no tenderness. There is no rebound and no guarding.  Musculoskeletal: Normal range of motion. She exhibits no edema and no tenderness.  Neurological: She is alert and oriented to person, place, and time.  Skin: Skin is warm and dry. No rash noted. No erythema.  Psychiatric: She has a normal mood and affect. Her behavior is normal.    ED Course  Procedures (including critical care time)  Labs Reviewed  URINALYSIS, ROUTINE W REFLEX MICROSCOPIC - Abnormal; Notable for the following:    Color, Urine AMBER (*)  BIOCHEMICALS MAY BE AFFECTED  BY COLOR   APPearance CLOUDY (*)     Hgb urine dipstick MODERATE (*)     Bilirubin Urine SMALL (*)     Ketones, ur 15 (*)     Protein, ur 30 (*)     Leukocytes, UA MODERATE (*)     All other components within normal limits  URINE MICROSCOPIC-ADD ON - Abnormal; Notable for the following:    Squamous Epithelial / LPF FEW (*)     Bacteria, UA FEW (*)     All other components within normal limits  URINE CULTURE   Dg Chest 2 View  11/18/2012  *RADIOLOGY REPORT*  Clinical Data: Cough and  fever.  Runny nose.  CHEST - 2 VIEW  Comparison: None.  Findings: Cardiomediastinal silhouette is within normal limits. The lungs are free of focal consolidations and pleural effusions. No edema. Visualized osseous structures have a normal appearance.  IMPRESSION: Negative exam.   Original Report Authenticated By: Norva Pavlov, M.D.      No diagnosis found.    MDM   Pt with symptoms consistent with influenza.  Normal exam here but is febrile.  No signs of breathing difficulty  No signs of strep pharyngitis, otitis or abnormal abdominal findings.   CXR wnl.  Will continue antipyretica and rest and fluids and return for any further problems.  4:08 PM UA also shows trich.  Denies dysuria.  Will treat with flagyl       Gwyneth Sprout, MD 11/18/12 [phone]

## 2012-11-19 LAB — URINE CULTURE: Colony Count: 80000

## 2014-01-18 ENCOUNTER — Emergency Department (HOSPITAL_BASED_OUTPATIENT_CLINIC_OR_DEPARTMENT_OTHER)
Admission: EM | Admit: 2014-01-18 | Discharge: 2014-01-18 | Disposition: A | Discharge status: Home / Self Care | Payer: Self-pay | Attending: Emergency Medicine | Admitting: Emergency Medicine

## 2014-01-18 ENCOUNTER — Emergency Department (HOSPITAL_BASED_OUTPATIENT_CLINIC_OR_DEPARTMENT_OTHER): Payer: Self-pay

## 2014-01-18 ENCOUNTER — Encounter (HOSPITAL_BASED_OUTPATIENT_CLINIC_OR_DEPARTMENT_OTHER): Payer: Self-pay | Admitting: Emergency Medicine

## 2014-01-18 DIAGNOSIS — Z880 Allergy status to penicillin: Secondary | ICD-10-CM | POA: Insufficient documentation

## 2014-01-18 DIAGNOSIS — Z862 Personal history of diseases of the blood and blood-forming organs and certain disorders involving the immune mechanism: Secondary | ICD-10-CM | POA: Insufficient documentation

## 2014-01-18 DIAGNOSIS — Z79899 Other long term (current) drug therapy: Secondary | ICD-10-CM | POA: Insufficient documentation

## 2014-01-18 DIAGNOSIS — R0789 Other chest pain: Secondary | ICD-10-CM | POA: Insufficient documentation

## 2014-01-18 HISTORY — DX: Anemia, unspecified: D64.9

## 2014-01-18 LAB — CBC WITH DIFFERENTIAL/PLATELET
Basophils Absolute: 0 10*3/uL (ref 0.0–0.1)
Basophils Relative: 0 % (ref 0–1)
EOS ABS: 0.2 10*3/uL (ref 0.0–0.7)
EOS PCT: 3 % (ref 0–5)
HEMATOCRIT: 36.7 % (ref 36.0–46.0)
Hemoglobin: 12.1 g/dL (ref 12.0–15.0)
LYMPHS ABS: 1.8 10*3/uL (ref 0.7–4.0)
LYMPHS PCT: 27 % (ref 12–46)
MCH: 30.4 pg (ref 26.0–34.0)
MCHC: 33 g/dL (ref 30.0–36.0)
MCV: 92.2 fL (ref 78.0–100.0)
MONO ABS: 0.8 10*3/uL (ref 0.1–1.0)
Monocytes Relative: 12 % (ref 3–12)
Neutro Abs: 3.9 10*3/uL (ref 1.7–7.7)
Neutrophils Relative %: 58 % (ref 43–77)
Platelets: 342 10*3/uL (ref 150–400)
RBC: 3.98 MIL/uL (ref 3.87–5.11)
RDW: 13.2 % (ref 11.5–15.5)
WBC: 6.6 10*3/uL (ref 4.0–10.5)

## 2014-01-18 LAB — COMPREHENSIVE METABOLIC PANEL
ALT: 18 U/L (ref 0–35)
AST: 20 U/L (ref 0–37)
Albumin: 3.9 g/dL (ref 3.5–5.2)
Alkaline Phosphatase: 62 U/L (ref 39–117)
BUN: 10 mg/dL (ref 6–23)
CALCIUM: 9.8 mg/dL (ref 8.4–10.5)
CO2: 25 meq/L (ref 19–32)
CREATININE: 0.7 mg/dL (ref 0.50–1.10)
Chloride: 102 mEq/L (ref 96–112)
GLUCOSE: 108 mg/dL — AB (ref 70–99)
Potassium: 4.3 mEq/L (ref 3.7–5.3)
Sodium: 139 mEq/L (ref 137–147)
Total Bilirubin: 0.5 mg/dL (ref 0.3–1.2)
Total Protein: 8.5 g/dL — ABNORMAL HIGH (ref 6.0–8.3)

## 2014-01-18 LAB — POCT I-STAT TROPONIN I: TROPONIN I, POC: 0.02 ng/mL (ref 0.00–0.08)

## 2014-01-18 NOTE — ED Provider Notes (Signed)
CSN: [id number]     Arrival date & time 01/18/14  0831 History   First MD Initiated Contact with Patient 01/18/14 [phone]     Chief Complaint  Patient presents with  . Shortness of Breath     (Consider location/radiation/quality/duration/timing/severity/associated sxs/prior Treatment) HPI Comments: Patient is a 38 year-old female otherwise healthy who presents with complaints of intermittent chest discomfort and shortness of breath for the past several months. She denies any injury or trauma. She denies any nausea, diaphoresis, radiation to the arm or jaw. She denies any exertional symptoms and has no cardiac risk factors. She does not smoke. She denies any aggravating or alleviating factors.  Patient is a 38 y.o. female presenting with shortness of breath. The history is provided by the patient.  Shortness of Breath Severity:  Moderate Onset quality:  Gradual Duration:  2 months Timing:  Intermittent Progression:  Worsening Chronicity:  New Context: not activity, not occupational exposure and not URI   Relieved by:  Nothing Worsened by:  Nothing tried Ineffective treatments:  None tried Associated symptoms: no abdominal pain, no cough, no fever and no sputum production   Risk factors: no hx of PE/DVT     Past Medical History  Diagnosis Date  . Anemia    History reviewed. No pertinent past surgical history. No family history on file. History  Substance Use Topics  . Smoking status: Never Smoker   . Smokeless tobacco: Not on file  . Alcohol Use: Yes     Comment: socially   OB History   Grav Para Term Preterm Abortions TAB SAB Ect Mult Living                 Review of Systems  Constitutional: Negative for fever.  Respiratory: Positive for shortness of breath. Negative for cough and sputum production.   Gastrointestinal: Negative for abdominal pain.  All other systems reviewed and are negative.      Allergies  Penicillins  Home Medications   Current Outpatient  Rx  Name  Route  Sig  Dispense  Refill  . calcium carbonate (OS-CAL) 600 MG TABS tablet   Oral   Take 600 mg by mouth 2 (two) times daily with a meal.         . Multiple Vitamins-Minerals (WOMENS MULTIVITAMIN PLUS PO)   Oral   Take by mouth.         . Biotin 5000 MCG CAPS   Oral   Take 1 capsule by mouth daily.           . metroNIDAZOLE (FLAGYL) 500 MG tablet   Oral   Take 1 tablet (500 mg total) by mouth 2 (two) times daily.   14 tablet   0   . oseltamivir (TAMIFLU) 75 MG capsule   Oral   Take 1 capsule (75 mg total) by mouth every 12 (twelve) hours.   10 capsule   0    BP 151/95  Pulse 98  Temp(Src) 97.9 F (36.6 C) (Oral)  Resp 16  Ht 5\' 7"  (1.702 m)  Wt 220 lb (99.791 kg)  BMI 34.45 kg/m2  SpO2 100%  LMP 01/17/2014 Physical Exam  Nursing note and vitals reviewed. Constitutional: She is oriented to person, place, and time. She appears well-developed and well-nourished. No distress.  HENT:  Head: Normocephalic and atraumatic.  Mouth/Throat: Oropharynx is clear and moist.  Neck: Normal range of motion. Neck supple.  Cardiovascular: Normal rate and regular rhythm.  Exam reveals no gallop and no  friction rub.   No murmur heard. Pulmonary/Chest: Effort normal and breath sounds normal. No respiratory distress. She has no wheezes. She has no rales. She exhibits tenderness.  There is mild tenderness to palpation of the anterior chest wall and sternum. This seems to reproduce her symptoms.  Abdominal: Soft. Bowel sounds are normal. She exhibits no distension and no mass. There is no tenderness. There is no rebound and no guarding.  Musculoskeletal: Normal range of motion. She exhibits no edema.  Lymphadenopathy:    She has no cervical adenopathy.  Neurological: She is alert and oriented to person, place, and time.  Skin: Skin is warm and dry. She is not diaphoretic.    ED Course  Procedures (including critical care time) Labs Review Labs Reviewed  CBC WITH  DIFFERENTIAL  COMPREHENSIVE METABOLIC PANEL   Imaging Review No results found.  EKG Interpretation    Date/Time:  Monday January 18 2014 08:59:42 EST Ventricular Rate:  63 PR Interval:  150 QRS Duration: 84 QT Interval:  394 QTC Calculation: 403 R Axis:   24 Text Interpretation:  Normal sinus rhythm with sinus arrhythmia Normal ECG Confirmed by DELOS  MD, Benedicta Sultan ([id number]) on 01/18/2014 9:18:11 AM            MDM   Final diagnoses:  None    Patient is a 38 year old female who presents with complaints of intermittent chest pains and shortness of breath for the past several months. Her symptoms come and go. Workup today reveals a normal EKG, laboratory studies, and chest x-ray. Her symptoms sound musculoskeletal in nature and I doubt a cardiac etiology. There is no hypoxia, tachycardia, or tachypnea to suggest pulmonary embolism. At this point I do not feel as though there is an emergent disease process and feel as though she is stable for discharge with ibuprofen and when necessary followup.    Geoffery Lyonsouglas Lynnann Knudsen, MD 01/18/14 1006

## 2014-01-18 NOTE — ED Notes (Signed)
Pt c/o intermittent shortness of breath and chest pain since "October at least". Pt reports shob is worse in the morning but gets better during the day. Pt speaking in complete sentences, nad noted. Pt denies chest pain at present. sts pain usually lasts about 15 mins when present.

## 2014-01-18 NOTE — Discharge Instructions (Signed)
Ibuprofen 600 mg every 6 hours as needed for pain.  Followup with your primary Dr. if not improving in the next week.   Chest Pain (Nonspecific) It is often hard to give a specific diagnosis for the cause of chest pain. There is always a chance that your pain could be related to something serious, such as a heart attack or a blood clot in the lungs. You need to follow up with your caregiver for further evaluation. CAUSES   Heartburn.  Pneumonia or bronchitis.  Anxiety or stress.  Inflammation around your heart (pericarditis) or lung (pleuritis or pleurisy).  A blood clot in the lung.  A collapsed lung (pneumothorax). It can develop suddenly on its own (spontaneous pneumothorax) or from injury (trauma) to the chest.  Shingles infection (herpes zoster virus). The chest wall is composed of bones, muscles, and cartilage. Any of these can be the source of the pain.  The bones can be bruised by injury.  The muscles or cartilage can be strained by coughing or overwork.  The cartilage can be affected by inflammation and become sore (costochondritis). DIAGNOSIS  Lab tests or other studies, such as X-rays, electrocardiography, stress testing, or cardiac imaging, may be needed to find the cause of your pain.  TREATMENT   Treatment depends on what may be causing your chest pain. Treatment may include:  Acid blockers for heartburn.  Anti-inflammatory medicine.  Pain medicine for inflammatory conditions.  Antibiotics if an infection is present.  You may be advised to change lifestyle habits. This includes stopping smoking and avoiding alcohol, caffeine, and chocolate.  You may be advised to keep your head raised (elevated) when sleeping. This reduces the chance of acid going backward from your stomach into your esophagus.  Most of the time, nonspecific chest pain will improve within 2 to 3 days with rest and mild pain medicine. HOME CARE INSTRUCTIONS   If antibiotics were  prescribed, take your antibiotics as directed. Finish them even if you start to feel better.  For the next few days, avoid physical activities that bring on chest pain. Continue physical activities as directed.  Do not smoke.  Avoid drinking alcohol.  Only take over-the-counter or prescription medicine for pain, discomfort, or fever as directed by your caregiver.  Follow your caregiver's suggestions for further testing if your chest pain does not go away.  Keep any follow-up appointments you made. If you do not go to an appointment, you could develop lasting (chronic) problems with pain. If there is any problem keeping an appointment, you must call to reschedule. SEEK MEDICAL CARE IF:   You think you are having problems from the medicine you are taking. Read your medicine instructions carefully.  Your chest pain does not go away, even after treatment.  You develop a rash with blisters on your chest. SEEK IMMEDIATE MEDICAL CARE IF:   You have increased chest pain or pain that spreads to your arm, neck, jaw, back, or abdomen.  You develop shortness of breath, an increasing cough, or you are coughing up blood.  You have severe back or abdominal pain, feel nauseous, or vomit.  You develop severe weakness, fainting, or chills.  You have a fever. THIS IS AN EMERGENCY. Do not wait to see if the pain will go away. Get medical help at once. Call your local emergency services (911 in [place] Do not drive yourself to the hospital. MAKE SURE YOU:   Understand these instructions.  Will watch your condition.  Will get help  right away if you are not doing well or get worse. Document Released: 08/29/2005 Document Revised: 02/11/2012 Document Reviewed: 06/24/2008 [facility] Patient Information 2014 [place].

## 2014-01-20 ENCOUNTER — Encounter (HOSPITAL_BASED_OUTPATIENT_CLINIC_OR_DEPARTMENT_OTHER): Payer: Self-pay | Admitting: Emergency Medicine

## 2014-01-20 ENCOUNTER — Emergency Department (HOSPITAL_BASED_OUTPATIENT_CLINIC_OR_DEPARTMENT_OTHER)
Admission: EM | Admit: 2014-01-20 | Discharge: 2014-01-20 | Disposition: A | Discharge status: Home / Self Care | Payer: Self-pay | Attending: Emergency Medicine | Admitting: Emergency Medicine

## 2014-01-20 ENCOUNTER — Emergency Department (HOSPITAL_BASED_OUTPATIENT_CLINIC_OR_DEPARTMENT_OTHER): Payer: Self-pay

## 2014-01-20 DIAGNOSIS — Z880 Allergy status to penicillin: Secondary | ICD-10-CM | POA: Insufficient documentation

## 2014-01-20 DIAGNOSIS — D649 Anemia, unspecified: Secondary | ICD-10-CM | POA: Insufficient documentation

## 2014-01-20 DIAGNOSIS — E669 Obesity, unspecified: Secondary | ICD-10-CM | POA: Insufficient documentation

## 2014-01-20 DIAGNOSIS — R0789 Other chest pain: Secondary | ICD-10-CM

## 2014-01-20 DIAGNOSIS — Z79899 Other long term (current) drug therapy: Secondary | ICD-10-CM | POA: Insufficient documentation

## 2014-01-20 DIAGNOSIS — R072 Precordial pain: Secondary | ICD-10-CM | POA: Insufficient documentation

## 2014-01-20 LAB — TROPONIN I: Troponin I: <0.3 ng/mL

## 2014-01-20 NOTE — ED Notes (Signed)
Pt c/o chest pain and shob intermittent since October and was seen and treated here for same on Monday. Pt sts pain is now in lower back and left side.

## 2014-01-20 NOTE — ED Notes (Signed)
Pt speaking in complete sentences, nad noted.

## 2014-01-20 NOTE — ED Provider Notes (Signed)
CSN: [id number]     Arrival date & time 01/20/14  1246 History   First MD Initiated Contact with Patient 01/20/14 1503     Chief Complaint  Patient presents with  . Shortness of Breath     (Consider location/radiation/quality/duration/timing/severity/associated sxs/prior Treatment) HPI  This a 38 yo female with no significant past medical history who presents with chest pain shortness of breath. Patient was seen and evaluated on Monday for the same. She reports intermittent chest pain and shortness of breath since October.  On Monday, patient's workup is reassuring and she was instructed to use ibuprofen 600 every 6 hours which she states she's been doing. She states that she's not had improvement of pain. She reports the pain is pressure-like. It starts when she gets up the morning. It is not exertional. It starts in her sternum and goes to under her right breast. It is not associated with food. She denies any pleuritic nature to the pain. Patient also reports shortness of breath mostly in the morning. She has no history of blood clots, birth control use, lower extremity swelling or hospitalization or surgery.  She denies any fevers, congestion.  Past Medical History  Diagnosis Date  . Anemia    History reviewed. No pertinent past surgical history. No family history on file. History  Substance Use Topics  . Smoking status: Never Smoker   . Smokeless tobacco: Not on file  . Alcohol Use: Yes     Comment: socially   OB History   Grav Para Term Preterm Abortions TAB SAB Ect Mult Living                 Review of Systems  Constitutional: Negative for fever.  Respiratory: Positive for chest tightness and shortness of breath. Negative for cough.   Cardiovascular: Positive for chest pain. Negative for palpitations and leg swelling.  Gastrointestinal: Negative for nausea, vomiting and abdominal pain.  Genitourinary: Negative for dysuria.  Musculoskeletal: Negative for back pain.  Skin:  Negative for rash.  Neurological: Negative for headaches.  Psychiatric/Behavioral: Negative for confusion.  All other systems reviewed and are negative.      Allergies  Penicillins  Home Medications   Current Outpatient Rx  Name  Route  Sig  Dispense  Refill  . Biotin 5000 MCG CAPS   Oral   Take 1 capsule by mouth daily.           . calcium carbonate (OS-CAL) 600 MG TABS tablet   Oral   Take 600 mg by mouth 2 (two) times daily with a meal.         . metroNIDAZOLE (FLAGYL) 500 MG tablet   Oral   Take 1 tablet (500 mg total) by mouth 2 (two) times daily.   14 tablet   0   . Multiple Vitamins-Minerals (WOMENS MULTIVITAMIN PLUS PO)   Oral   Take by mouth.         . oseltamivir (TAMIFLU) 75 MG capsule   Oral   Take 1 capsule (75 mg total) by mouth every 12 (twelve) hours.   10 capsule   0    BP 122/102  Pulse 94  Temp(Src) 98.3 F (36.8 C) (Oral)  Resp 18  SpO2 100%  LMP 01/18/2014 Physical Exam  Nursing note and vitals reviewed. Constitutional: She is oriented to person, place, and time. No distress.  Obese  HENT:  Head: Normocephalic and atraumatic.  Mouth/Throat: Oropharynx is clear and moist.  Neck: Neck supple.  Cardiovascular:  Normal rate, regular rhythm and normal heart sounds.   Pulmonary/Chest: Effort normal and breath sounds normal. No respiratory distress. She has no wheezes. She exhibits tenderness.  Tenderness to palpation over the sternum and right breast, no mass  Abdominal: Soft. Bowel sounds are normal. There is no tenderness.  Musculoskeletal: She exhibits edema.  Neurological: She is alert and oriented to person, place, and time.  Skin: Skin is warm and dry.  Psychiatric: She has a normal mood and affect.    ED Course  Procedures (including critical care time) Labs Review Labs Reviewed  TROPONIN I   Imaging Review Dg Chest 2 View  01/20/2014   CLINICAL DATA:  Shortness of Breath  EXAM: CHEST  2 VIEW  COMPARISON:  January 18, 2014  FINDINGS: Lungs are clear. Heart size and pulmonary vascularity are normal. No adenopathy. No bone lesions.  IMPRESSION: No abnormality noted.   Electronically Signed   By: Bretta BangWilliam  Woodruff M.D.   On: 01/20/2014 14:28    EKG Interpretation    Date/Time:  Wednesday January 20 2014 13:07:28 EST Ventricular Rate:  85 PR Interval:  144 QRS Duration: 88 QT Interval:  370 QTC Calculation: 440 R Axis:   29 Text Interpretation:  Normal sinus rhythm Normal ECG No significant change was found Confirmed by Manus GunningANCOUR  MD, STEPHEN ([id number]) on 01/20/2014 1:23:47 PM            MDM   Final diagnoses:  Chest wall pain   Patient presents with shortness of breath and chest pain that has been intermittent since October. It is nonexertional and is reproducible on exam. She has no risk factors for heart disease in this young. Low suspicion for ACS. EKG is normal, chest x-ray and troponin are negative. She is PERC negative.  Patient was encouraged to continue ibuprofen for anti-inflammatory effects. She is only taking ibuprofen for 2 days. [facility] as [facility] if symptoms persist.  After history, exam, and medical workup I feel the patient has been appropriately medically screened and is safe for discharge home. Pertinent diagnoses were discussed with the patient. Patient was given return precautions.     Shon Batonourtney F Logan Vegh, MD 01/20/14 [phone]

## 2014-01-20 NOTE — Discharge Instructions (Signed)
Chest Wall Pain Chest wall pain is pain in or around the bones and muscles of your chest. It may take up to 6 weeks to get better. It may take longer if you must stay physically active in your work and activities.  You were seen today for chest pain. Your pain appears to be musculoskeletal. He need to continue to take ibuprofen for the next 1-2 weeks for full anti-inflammatory effect. Your relatively low risk for heart disease will be referred to cardiology for symptoms persist. See discharge instructions. He should return if he had any new or worsening symptoms.  CAUSES  Chest wall pain may happen on its own. However, it may be caused by:  A viral illness like the flu.  Injury.  Coughing.  Exercise.  Arthritis.  Fibromyalgia.  Shingles. HOME CARE INSTRUCTIONS   Avoid overtiring physical activity. Try not to strain or perform activities that cause pain. This includes any activities using your chest or your abdominal and side muscles, especially if heavy weights are used.  Put ice on the sore area.  Put ice in a plastic bag.  Place a towel between your skin and the bag.  Leave the ice on for 15-20 minutes per hour while awake for the first 2 days.  Only take over-the-counter or prescription medicines for pain, discomfort, or fever as directed by your caregiver. SEEK IMMEDIATE MEDICAL CARE IF:   Your pain increases, or you are very uncomfortable.  You have a fever.  Your chest pain becomes worse.  You have new, unexplained symptoms.  You have nausea or vomiting.  You feel sweaty or lightheaded.  You have a cough with phlegm (sputum), or you cough up blood. MAKE SURE YOU:   Understand these instructions.  Will watch your condition.  Will get help right away if you are not doing well or get worse. Document Released: 11/19/2005 Document Revised: 02/11/2012 Document Reviewed: 07/16/2011 [facility] Patient Information 2014 [place].

## 2014-05-10 ENCOUNTER — Emergency Department (HOSPITAL_BASED_OUTPATIENT_CLINIC_OR_DEPARTMENT_OTHER)
Admission: EM | Admit: 2014-05-10 | Discharge: 2014-05-10 | Disposition: A | Discharge status: Home / Self Care | Payer: Self-pay | Attending: Emergency Medicine | Admitting: Emergency Medicine

## 2014-05-10 ENCOUNTER — Encounter (HOSPITAL_BASED_OUTPATIENT_CLINIC_OR_DEPARTMENT_OTHER): Payer: Self-pay | Admitting: Emergency Medicine

## 2014-05-10 DIAGNOSIS — L509 Urticaria, unspecified: Secondary | ICD-10-CM

## 2014-05-10 DIAGNOSIS — Z79899 Other long term (current) drug therapy: Secondary | ICD-10-CM | POA: Insufficient documentation

## 2014-05-10 DIAGNOSIS — Z792 Long term (current) use of antibiotics: Secondary | ICD-10-CM | POA: Insufficient documentation

## 2014-05-10 DIAGNOSIS — L500 Allergic urticaria: Secondary | ICD-10-CM | POA: Insufficient documentation

## 2014-05-10 DIAGNOSIS — R1310 Dysphagia, unspecified: Secondary | ICD-10-CM | POA: Insufficient documentation

## 2014-05-10 DIAGNOSIS — Z862 Personal history of diseases of the blood and blood-forming organs and certain disorders involving the immune mechanism: Secondary | ICD-10-CM | POA: Insufficient documentation

## 2014-05-10 DIAGNOSIS — Z880 Allergy status to penicillin: Secondary | ICD-10-CM | POA: Insufficient documentation

## 2014-05-10 MED ORDER — PREDNISONE 20 MG PO TABS: 60.0000 mg | ORAL_TABLET | Freq: Every day | ORAL | Status: DC

## 2014-05-10 MED ORDER — FAMOTIDINE 20 MG PO TABS
40.0000 mg | ORAL_TABLET | Freq: Once | ORAL | Status: AC
  Administered 2014-05-10: 40 mg via ORAL
  Filled 2014-05-10: qty 2

## 2014-05-10 MED ORDER — DIPHENHYDRAMINE HCL 25 MG PO CAPS
50.0000 mg | ORAL_CAPSULE | Freq: Once | ORAL | Status: AC
  Administered 2014-05-10: 50 mg via ORAL
  Filled 2014-05-10: qty 2

## 2014-05-10 MED ORDER — EPINEPHRINE 0.3 MG/0.3ML IJ SOAJ: 0.3000 mg | Freq: Once | INTRAMUSCULAR | Status: DC

## 2014-05-10 MED ORDER — PREDNISONE 50 MG PO TABS
60.0000 mg | ORAL_TABLET | Freq: Once | ORAL | Status: AC
  Administered 2014-05-10: 60 mg via ORAL
  Filled 2014-05-10 (×2): qty 1

## 2014-05-10 NOTE — ED Notes (Signed)
Pt. On cell phone talking and watching talk show on cell phone.

## 2014-05-10 NOTE — ED Notes (Signed)
Pt. Has no cough and no resp. Distress noted.  No closing of the tonsil area and no swelling of the epiglottis.  Pt. Is speaking full clear sentences with no drooling noted.  Pt. Has no swelling in the face and no swelling in the lips or the arms or the neck.

## 2014-05-10 NOTE — ED Provider Notes (Signed)
This chart was scribed for Tiffany Number, DO by Tiffany Reid, ED Scribe. The patient was seen in room MH12/MH12. Patient's care was started at 7:51 PM.  TIME SEEN: 7:51 PM   CHIEF COMPLAINT: allergic reaction  HPI:  Tiffany Reid is a 38 y.o. female, with a h/o anemia, who presents to the Emergency Department complaining of hives noted on bilateral arms last night. Pt states that she was awakened from her sleep from itching and "needles" sensation in her arms. Pt took Benadryl and went back to sleep. She reports associated hives on the back of her neck onset today at the end of the work day. She also states that her throat is tight and "feels like someone is choking her" since been triaged in the emergency department. She reports difficulty swallowing but denies difficulty speaking or breathing. Pt also denies fever, insect bite, change in medication, detergent or soap. Her hives have resolved. No wheezing. No lip or tongue swelling.  ROS: See HPI Constitutional: no fever  Eyes: no drainage  ENT: difficulty swallowing, no runny nose   Cardiovascular:  no chest pain  Resp: no SOB  GI: no vomiting GU: no dysuria Integumentary: no rash  Allergy: no hives  Musculoskeletal: no leg swelling  Neurological: no slurred speech Skin: hives ROS otherwise negative  PAST MEDICAL HISTORY/PAST SURGICAL HISTORY:  Past Medical History  Diagnosis Date  . Anemia     MEDICATIONS:  Prior to Admission medications   Medication Sig Start Date End Date Taking? Authorizing Provider  Biotin 5000 MCG CAPS Take 1 capsule by mouth daily.      Historical Provider, MD  calcium carbonate (OS-CAL) 600 MG TABS tablet Take 600 mg by mouth 2 (two) times daily with a meal.    Historical Provider, MD  metroNIDAZOLE (FLAGYL) 500 MG tablet Take 1 tablet (500 mg total) by mouth 2 (two) times daily. 11/18/12   Gwyneth Sprout, MD  Multiple Vitamins-Minerals (WOMENS MULTIVITAMIN PLUS PO) Take by mouth.    Historical  Provider, MD  oseltamivir (TAMIFLU) 75 MG capsule Take 1 capsule (75 mg total) by mouth every 12 (twelve) hours. 11/18/12   Gwyneth Sprout, MD    ALLERGIES:  Allergies  Allergen Reactions  . Penicillins     Throat itches and hand and neck swell    SOCIAL HISTORY:  History  Substance Use Topics  . Smoking status: Never Smoker   . Smokeless tobacco: Not on file  . Alcohol Use: Yes     Comment: socially    FAMILY HISTORY: No family history on file.  EXAM: Triage Vitals: BP 121/73  Pulse 91  Temp(Src) 97.4 F (36.3 C) (Oral)  Resp 20  Ht 5\' 9"  (1.753 m)  Wt 220 lb (99.791 kg)  BMI 32.47 kg/m2  SpO2 100%  LMP 04/12/2014 CONSTITUTIONAL: Alert and oriented and responds appropriately to questions. Well-appearing; well-nourished HEAD: Normocephalic EYES: Conjunctivae clear, PERRL ENT: normal nose; no rhinorrhea; moist mucous membranes; pharynx without lesions noted, no trismus or drooling, no voice changes, airway patent NECK: Supple, no meningismus, no LAD  CARD: RRR; S1 and S2 appreciated; no murmurs, no clicks, no rubs, no gallops RESP: Normal chest excursion without splinting or tachypnea; breath sounds clear and equal bilaterally; no wheezes, no rhonchi, no rales, no respiratory distress or hypoxia, airway is patent ABD/GI: Normal bowel sounds; non-distended; soft, non-tender, no rebound, no guarding BACK:  The back appears normal and is non-tender to palpation, there is no CVA tenderness EXT: Normal ROM in  all joints; non-tender to palpation; no edema; normal capillary refill; no cyanosis    SKIN: Normal color for age and race; warm, no hives, no rash appreciated, no mucosal involvement, no rash on her palms or soles NEURO: Moves all extremities equally PSYCH: The patient's mood and manner are appropriate. Grooming and personal hygiene are appropriate.  DIAGNOSTIC STUDIES: Oxygen Saturation is 100% on RA, normal by my interpretation.    COORDINATION OF CARE: 7:54 PM  Discussed treatment plan with pt at bedside and pt agreed to plan.  MEDICAL DECISION MAKING: Patient here with possible allergic reaction. She states she's noticed hives which have now resolved. She states that while she was being triaged she felt that she was having some throat tightening. No chest pain shortness of breath. No difficulty breathing or swallowing. No hypoxia or respiratory distress. We'll give Benadryl, Pepcid and prednisone and reassess. I do not feel she needs an EpiPen at this time. I have some suspicion that patient may have embellished her symptoms and triaged in order to be seen faster as the waiting room was quite full. She is very well-appearing and in no apparent distress. Anticipate that she will be able to be discharged home.  ED PROGRESS: Patient reports she is feeling much better. We'll discharge with prescription for EpiPen and prednisone. Discussed strict return precautions and supportive care instructions. Patient verbalizes understanding is comfortable plan. Discussed with patient that she will need to follow up with PCP for referral to allergy/immunology.  I personally performed the services described in this documentation, which was scribed in my presence. The recorded information has been reviewed and is accurate.    Tiffany MawKristen N Deitrick Ferreri, DO 05/10/14 2057

## 2014-05-10 NOTE — Discharge Instructions (Signed)

## 2014-05-10 NOTE — ED Notes (Signed)
Itching last night to her arms and the back of her neck. Today at work she had hives and felt like her throat was tight.

## 2014-11-20 ENCOUNTER — Emergency Department (HOSPITAL_BASED_OUTPATIENT_CLINIC_OR_DEPARTMENT_OTHER)
Admission: EM | Admit: 2014-11-20 | Discharge: 2014-11-20 | Disposition: A | Discharge status: Home / Self Care | Payer: Medicaid Other | Attending: Emergency Medicine | Admitting: Emergency Medicine

## 2014-11-20 ENCOUNTER — Encounter (HOSPITAL_BASED_OUTPATIENT_CLINIC_OR_DEPARTMENT_OTHER): Payer: Self-pay | Admitting: *Deleted

## 2014-11-20 ENCOUNTER — Emergency Department (HOSPITAL_BASED_OUTPATIENT_CLINIC_OR_DEPARTMENT_OTHER): Payer: Medicaid Other

## 2014-11-20 DIAGNOSIS — O2341 Unspecified infection of urinary tract in pregnancy, first trimester: Secondary | ICD-10-CM | POA: Diagnosis not present

## 2014-11-20 DIAGNOSIS — O9989 Other specified diseases and conditions complicating pregnancy, childbirth and the puerperium: Secondary | ICD-10-CM | POA: Diagnosis not present

## 2014-11-20 DIAGNOSIS — Z862 Personal history of diseases of the blood and blood-forming organs and certain disorders involving the immune mechanism: Secondary | ICD-10-CM | POA: Diagnosis not present

## 2014-11-20 DIAGNOSIS — E669 Obesity, unspecified: Secondary | ICD-10-CM | POA: Insufficient documentation

## 2014-11-20 DIAGNOSIS — R109 Unspecified abdominal pain: Secondary | ICD-10-CM

## 2014-11-20 DIAGNOSIS — Z3490 Encounter for supervision of normal pregnancy, unspecified, unspecified trimester: Secondary | ICD-10-CM

## 2014-11-20 DIAGNOSIS — R1084 Generalized abdominal pain: Secondary | ICD-10-CM | POA: Insufficient documentation

## 2014-11-20 DIAGNOSIS — Z3A01 Less than 8 weeks gestation of pregnancy: Secondary | ICD-10-CM | POA: Insufficient documentation

## 2014-11-20 DIAGNOSIS — Z880 Allergy status to penicillin: Secondary | ICD-10-CM | POA: Diagnosis not present

## 2014-11-20 DIAGNOSIS — Z79899 Other long term (current) drug therapy: Secondary | ICD-10-CM | POA: Diagnosis not present

## 2014-11-20 LAB — WET PREP, GENITAL
TRICH WET PREP: NONE SEEN
Yeast Wet Prep HPF POC: NONE SEEN

## 2014-11-20 LAB — URINALYSIS, ROUTINE W REFLEX MICROSCOPIC
BILIRUBIN URINE: NEGATIVE
Glucose, UA: NEGATIVE mg/dL
KETONES UR: NEGATIVE mg/dL
NITRITE: NEGATIVE
PH: 7 (ref 5.0–8.0)
PROTEIN: NEGATIVE mg/dL
Specific Gravity, Urine: 1.018 (ref 1.005–1.030)
UROBILINOGEN UA: 1 mg/dL (ref 0.0–1.0)

## 2014-11-20 LAB — URINE MICROSCOPIC-ADD ON

## 2014-11-20 LAB — HIV ANTIBODY (ROUTINE TESTING W REFLEX): HIV 1&2 Ab, 4th Generation: NONREACTIVE

## 2014-11-20 LAB — PREGNANCY, URINE: Preg Test, Ur: POSITIVE — AB

## 2014-11-20 LAB — ABO/RH: ABO/RH(D): A POS

## 2014-11-20 LAB — RPR

## 2014-11-20 LAB — HCG, QUANTITATIVE, PREGNANCY: HCG, BETA CHAIN, QUANT, S: 791 m[IU]/mL — AB (ref <5)

## 2014-11-20 MED ORDER — CEFTRIAXONE SODIUM 1 G IJ SOLR
INTRAMUSCULAR | Status: AC
  Filled 2014-11-20: qty 10

## 2014-11-20 MED ORDER — SODIUM CHLORIDE 0.9 % IV BOLUS (SEPSIS)
1000.0000 mL | Freq: Once | INTRAVENOUS | Status: AC
  Administered 2014-11-20: 1000 mL via INTRAVENOUS

## 2014-11-20 MED ORDER — DEXTROSE 5 % IV SOLN
1.0000 g | Freq: Once | INTRAVENOUS | Status: AC
  Administered 2014-11-20: 1 g via INTRAVENOUS

## 2014-11-20 MED ORDER — AZITHROMYCIN 250 MG PO TABS: 1000.0000 mg | ORAL_TABLET | Freq: Once | ORAL | Status: DC

## 2014-11-20 MED ORDER — NITROFURANTOIN MONOHYD MACRO 100 MG PO CAPS: 100.0000 mg | ORAL_CAPSULE | Freq: Two times a day (BID) | ORAL | Status: DC

## 2014-11-20 NOTE — ED Notes (Signed)
Pt reports lower abdominal cramping and urinary frequency x 2 days.  Denies vaginal discharge.

## 2014-11-20 NOTE — ED Provider Notes (Signed)
CSN: [id number]     Arrival date & time 11/20/14  1221 History   First MD Initiated Contact with Patient 11/20/14 1400     Chief Complaint  Patient presents with  . Abdominal Cramping     (Consider location/radiation/quality/duration/timing/severity/associated sxs/prior Treatment) HPI  38 year old female presents with complaints of low abdominal cramping and dysuria. Patient reports for the past 2 days she has had abdominal cramping with associated urinary frequency, urgency but no significant burning on urination. Abdominal cramping is intermittent and mild in intensity. She has no associated fever, headache, chest pain, shortness of breath, productive cough, nausea vomiting diarrhea, hematuria, vaginal bleeding, vaginal discharge, hematochezia or melena. She has normal appetite. She is a G4 P2 and her last menstrual period was 10-20-2014. She is sexually active with her husband, not using protection. Denies any prior history of STD. She has no other complaint.  Past Medical History  Diagnosis Date  . Anemia    History reviewed. No pertinent past surgical history. History reviewed. No pertinent family history. History  Substance Use Topics  . Smoking status: Never Smoker   . Smokeless tobacco: Not on file  . Alcohol Use: Yes     Comment: socially   OB History    No data available     Review of Systems  All other systems reviewed and are negative.     Allergies  Penicillins  Home Medications   Prior to Admission medications   Medication Sig Start Date End Date Taking? Authorizing Provider  Biotin 5000 MCG CAPS Take 1 capsule by mouth daily.      Historical Provider, MD  calcium carbonate (OS-CAL) 600 MG TABS tablet Take 600 mg by mouth 2 (two) times daily with a meal.    Historical Provider, MD  Multiple Vitamins-Minerals (WOMENS MULTIVITAMIN PLUS PO) Take by mouth.    Historical Provider, MD   BP 136/79 mmHg  Pulse 64  Temp(Src) 97.8 F (36.6 C) (Oral)  Ht 5\' 8"   (1.727 m)  Wt 230 lb (104.327 kg)  BMI 34.98 kg/m2  SpO2 100%  LMP 10/20/2014 Physical Exam  Constitutional: She appears well-developed and well-nourished. No distress.  Obese African-American female patient in no acute distress, nontoxic in appearance.  HENT:  Head: Atraumatic.  Eyes: Conjunctivae are normal.  Neck: Neck supple.  Cardiovascular: Normal rate and regular rhythm.   Pulmonary/Chest: Effort normal and breath sounds normal.  Abdominal: Soft. Bowel sounds are normal. There is tenderness (Mild epigastric tenderness without guarding rebound tenderness. Mild suprapubic tenderness without guarding or rebound tenderness.).  Genitourinary:  Chaperone present: No inguinal lymphadenopathy, normal external genitalia, no significant discomfort with speculum insertion. Normal vaginal vault with mild functional white discharge. Cervical os is visualized and closed without any significant rash or lesion. On bimanual examination, and no adnexal tenderness and no cervical motion tenderness.  Neurological: She is alert.  Skin: No rash noted.  Psychiatric: She has a normal mood and affect.  Nursing note and vitals reviewed.   ED Course  Procedures (including critical care time)  3:31 PM Patient presents with complaints of dysuria and lower abdominal cramping. Urination demonstrate evidence of a urinary tract infection. Pregnancy test is positive. Urine culture sent, patient will be given Rocephin via IV. A pelvic examination was performed without evidence to suggest PID. A transvaginal ultrasound was performed demonstrating a probably early intrauterine gestational sac without yolk sac, fetal pole, or cardiac activity. It is recommended for patient to have a follow-up ultrasound within the next 2 weeks,  patient aware and agrees.  4:54 PM Wet prep with few clue cells and may WBC. This could be a contaminant from a urinary tract infection but because patient is pregnant she has received  Rocephin 1 g and I will also give Zithromax 1000 mg as coverage for possible STD. Culture sent. Patient made aware to follow-up with [facility] in 1-2 weeks for a repeat transvaginal ultrasound for further management of her pregnancy. Her pregnancy is estimated at 5 weeks and 1 day.  Labs Review Labs Reviewed  WET PREP, GENITAL - Abnormal; Notable for the following:    Clue Cells Wet Prep HPF POC FEW (*)    WBC, Wet Prep HPF POC MANY (*)    All other components within normal limits  URINALYSIS, ROUTINE W REFLEX MICROSCOPIC - Abnormal; Notable for the following:    APPearance CLOUDY (*)    Hgb urine dipstick SMALL (*)    Leukocytes, UA LARGE (*)    All other components within normal limits  PREGNANCY, URINE - Abnormal; Notable for the following:    Preg Test, Ur POSITIVE (*)    All other components within normal limits  URINE MICROSCOPIC-ADD ON - Abnormal; Notable for the following:    Squamous Epithelial / LPF MANY (*)    Bacteria, UA MANY (*)    All other components within normal limits  HCG, QUANTITATIVE, PREGNANCY - Abnormal; Notable for the following:    hCG, Beta Chain, Quant, S 791 (*)    All other components within normal limits  GC/CHLAMYDIA PROBE AMP  URINE CULTURE  HIV ANTIBODY (ROUTINE TESTING)  RPR  ABO/RH    Imaging Review US Ob Comp Less 14 Wks  11/20/2014   CLINICAL DATA:  Lower or midline pelvic pain for 1 day.  UTI.  EXAM: OBSTETRIC <14 WK [place] AND TRANSVAGINAL OB US  TECHNIQUE: Both transabdominal and transvaginal ultrasound examinations were performed for complete evaluation of the gestation as well as the maternal uterus, adnexal regions, and pelvic cul-de-sac. Transvaginal technique was performed to assess early pregnancy.  COMPARISON:  None.  FINDINGS: Intrauterine gestational sac: Visualized/normal in shape.  Yolk sac:  Not visualize  Embryo:  Not visualize  MSD: 3.8 mm   5 w   1 d    [place] EDC: 07/22/2015  Maternal uterus/adnexae: No subchorionic hemorrhage.  Left ovary is normal in size and echogenicity. Right ovary is normal in size. There is a small heterogeneously hypoechoic area in the right ovary measuring 1.9 x 1.7 x 1.1 cm with peripheral flow likely representing a corpus luteum cyst. There is no pelvic free fluid.  IMPRESSION: Probable early intrauterine gestational sac, but no yolk sac, fetal pole, or cardiac activity yet visualized. Recommend follow-up quantitative B-HCG levels and follow-up US in 14 days to confirm and assess viability. This recommendation follows SRU consensus guidelines: Diagnostic Criteria for Nonviable Pregnancy Early in the First Trimester. Malva Limes Med 2013; 161:0960-45.   Electronically Signed   By: Elige Ko   On: 11/20/2014 14:57   US Ob Transvaginal  11/20/2014   CLINICAL DATA:  Lower or midline pelvic pain for 1 day.  UTI.  EXAM: OBSTETRIC <14 WK [place] AND TRANSVAGINAL OB US  TECHNIQUE: Both transabdominal and transvaginal ultrasound examinations were performed for complete evaluation of the gestation as well as the maternal uterus, adnexal regions, and pelvic cul-de-sac. Transvaginal technique was performed to assess early pregnancy.  COMPARISON:  None.  FINDINGS: Intrauterine gestational sac: Visualized/normal in shape.  Yolk sac:  Not visualize  Embryo:  Not visualize  MSD: 3.8 mm   5 w   1 d    [place] EDC: 07/22/2015  Maternal uterus/adnexae: No subchorionic hemorrhage. Left ovary is normal in size and echogenicity. Right ovary is normal in size. There is a small heterogeneously hypoechoic area in the right ovary measuring 1.9 x 1.7 x 1.1 cm with peripheral flow likely representing a corpus luteum cyst. There is no pelvic free fluid.  IMPRESSION: Probable early intrauterine gestational sac, but no yolk sac, fetal pole, or cardiac activity yet visualized. Recommend follow-up quantitative B-HCG levels and follow-up US in 14 days to confirm and assess viability. This recommendation follows SRU consensus guidelines: Diagnostic  Criteria for Nonviable Pregnancy Early in the First Trimester. Malva Limes Engl J Med 2013; 960:4540-98; 369:1443-51.   Electronically Signed   By: Elige KoHetal  Patel   On: 11/20/2014 14:57     EKG Interpretation None      MDM   Final diagnoses:  Pregnancy  Abdominal pain  UTI (urinary tract infection) during pregnancy, first trimester    BP 126/76 mmHg  Pulse 79  Temp(Src) 97.8 F (36.6 C) (Oral)  Resp 18  Ht 5\' 8"  (1.727 m)  Wt 230 lb (104.327 kg)  BMI 34.98 kg/m2  SpO2 100%  LMP 10/20/2014  I have reviewed nursing notes and vital signs. I personally reviewed the imaging tests through PACS system  I reviewed available ER/hospitalization records thought the EMR      Fayrene HelperBowie Numa Schroeter, PA-C 11/20/14 1702  Hilario Quarryanielle S Ray, MD 11/21/14 [phone]

## 2014-11-20 NOTE — Discharge Instructions (Signed)
You are pregnant, estimated to be 5 weeks and 1 days.  You are also diagnosed with a urinary tract infection.  Take antibiotic for the full duration.  Follow up with [facility] in the next 1-2 weeks for a recheck of your pregnancy with ultrasound.  Avoid alcohol, smoking, or any medications that can be harmful to pregnancy.  Return if your condition worsen or if you have other concerns.     First Trimester of Pregnancy The first trimester of pregnancy is from week 1 until the end of week 12 (months 1 through 3). During this time, your baby will begin to develop inside you. At 6-8 weeks, the eyes and face are formed, and the heartbeat can be seen on ultrasound. At the end of 12 weeks, all the baby's organs are formed. Prenatal care is all the medical care you receive before the birth of your baby. Make sure you get good prenatal care and follow all of your doctor's instructions. HOME CARE  Medicines  Take medicine only as told by your doctor. Some medicines are safe and some are not during pregnancy.  Take your prenatal vitamins as told by your doctor.  Take medicine that helps you poop (stool softener) as needed if your doctor says it is okay. Diet  Eat regular, healthy meals.  Your doctor will tell you the amount of weight gain that is right for you.  Avoid raw meat and uncooked cheese.  If you feel sick to your stomach (nauseous) or throw up (vomit):  Eat 4 or 5 small meals a day instead of 3 large meals.  Try eating a few soda crackers.  Drink liquids between meals instead of during meals.  If you have a hard time pooping (constipation):  Eat high-fiber foods like fresh vegetables, fruit, and whole grains.  Drink enough fluids to keep your pee (urine) clear or pale yellow. Activity and Exercise  Exercise only as told by your doctor. Stop exercising if you have cramps or pain in your lower belly (abdomen) or low back.  Try to avoid standing for long periods of time. Move  your legs often if you must stand in one place for a long time.  Avoid heavy lifting.  Wear low-heeled shoes. Sit and stand up straight.  You can have sex unless your doctor tells you not to. Relief of Pain or Discomfort  Wear a good support bra if your breasts are sore.  Take warm water baths (sitz baths) to soothe pain or discomfort caused by hemorrhoids. Use hemorrhoid cream if your doctor says it is okay.  Rest with your legs raised if you have leg cramps or low back pain.  Wear support hose if you have puffy, bulging veins (varicose veins) in your legs. Raise (elevate) your feet for 15 minutes, 3-4 times a day. Limit salt in your diet. Prenatal Care  Schedule your prenatal visits by the twelfth week of pregnancy.  Write down your questions. Take them to your prenatal visits.  Keep all your prenatal visits as told by your doctor. Safety  Wear your seat belt at all times when driving.  Make a list of emergency phone numbers. The list should include numbers for family, friends, the hospital, and police and fire departments. General Tips  Ask your doctor for a referral to a local prenatal class. Begin classes no later than at the start of month 6 of your pregnancy.  Ask for help if you need counseling or help with nutrition. Your doctor  can give you advice or tell you where to go for help.  Do not use hot tubs, steam rooms, or saunas.  Do not douche or use tampons or scented sanitary pads.  Do not cross your legs for long periods of time.  Avoid litter boxes and soil used by cats.  Avoid all smoking, herbs, and alcohol. Avoid drugs not approved by your doctor.  Visit your dentist. At home, brush your teeth with a soft toothbrush. Be gentle when you floss. GET HELP IF:  You are dizzy.  You have mild cramps or pressure in your lower belly.  You have a nagging pain in your belly area.  You continue to feel sick to your stomach, throw up, or have watery poop  (diarrhea).  You have a bad smelling fluid coming from your vagina.  You have pain with peeing (urination).  You have increased puffiness (swelling) in your face, hands, legs, or ankles. GET HELP RIGHT AWAY IF:   You have a fever.  You are leaking fluid from your vagina.  You have spotting or bleeding from your vagina.  You have very bad belly cramping or pain.  You gain or lose weight rapidly.  You throw up blood. It may look like coffee grounds.  You are around people who have [place] measles, fifth disease, or chickenpox.  You have a very bad headache.  You have shortness of breath.  You have any kind of trauma, such as from a fall or a car accident. Document Released: 05/07/2008 Document Revised: 04/05/2014 Document Reviewed: 09/29/2013 [facility] Patient Information 2015 [place], [place]. This information is not intended to replace advice given to you by your health care provider. Make sure you discuss any questions you have with your health care provider.  Pregnancy and Urinary Tract Infection A urinary tract infection (UTI) is a bacterial infection of the urinary tract. Infection of the urinary tract can include the ureters, kidneys (pyelonephritis), bladder (cystitis), and urethra (urethritis). All pregnant women should be screened for bacteria in the urinary tract. Identifying and treating a UTI will decrease the risk of preterm labor and developing more serious infections in both the mother and baby. CAUSES Bacteria germs cause almost all UTIs.  RISK FACTORS Many factors can increase your chances of getting a UTI during pregnancy. These include:  Having a short urethra.  Poor toilet and hygiene habits.  Sexual intercourse.  Blockage of urine along the urinary tract.  Problems with the pelvic muscles or nerves.  Diabetes.  Obesity.  Bladder problems after having several children.  Previous history of UTI. SIGNS AND SYMPTOMS   Pain, burning, or a stinging  feeling when urinating.  Suddenly feeling the need to urinate right away (urgency).  Loss of bladder control (urinary incontinence).  Frequent urination, more than is common with pregnancy.  Lower abdominal or back discomfort.  Cloudy urine.  Blood in the urine (hematuria).  Fever. When the kidneys are infected, the symptoms may be:  Back pain.  Flank pain on the right side more so than the left.  Fever.  Chills.  Nausea.  Vomiting. DIAGNOSIS  A urinary tract infection is usually diagnosed through urine tests. Additional tests and procedures are sometimes done. These may include:  Ultrasound exam of the kidneys, ureters, bladder, and urethra.  Looking in the bladder with a lighted tube (cystoscopy). TREATMENT Typically, UTIs can be treated with antibiotic medicines.  HOME CARE INSTRUCTIONS   Only take over-the-counter or prescription medicines as directed by your health care  provider. If you were prescribed antibiotics, take them as directed. Finish them even if you start to feel better.  Drink enough fluids to keep your urine clear or pale yellow.  Do not have sexual intercourse until the infection is gone and your health care provider says it is okay.  Make sure you are tested for UTIs throughout your pregnancy. These infections often come back. Preventing a UTI in the Future  Practice good toilet habits. Always wipe from front to back. Use the tissue only once.  Do not hold your urine. Empty your bladder as soon as possible when the urge comes.  Do not douche or use deodorant sprays.  Wash with soap and warm water around the genital area and the anus.  Empty your bladder before and after sexual intercourse.  Wear underwear with a cotton crotch.  Avoid caffeine and carbonated drinks. They can irritate the bladder.  Drink cranberry juice or take cranberry pills. This may decrease the risk of getting a UTI.  Do not drink alcohol.  Keep all your  appointments and tests as scheduled. SEEK MEDICAL CARE IF:   Your symptoms get worse.  You are still having fevers 2 or more days after treatment begins.  You have a rash.  You feel that you are having problems with medicines prescribed.  You have abnormal vaginal discharge. SEEK IMMEDIATE MEDICAL CARE IF:   You have back or flank pain.  You have chills.  You have blood in your urine.  You have nausea and vomiting.  You have contractions of your uterus.  You have a gush of fluid from the vagina. MAKE SURE YOU:  Understand these instructions.   Will watch your condition.   Will get help right away if you are not doing well or get worse.  Document Released: 03/16/2011 Document Revised: 09/09/2013 Document Reviewed: 06/18/2013 [facility] CenterExit[facility] Patient Information 2015 [place]Exit[place], [place]LL[place]. This information is not intended to replace advice given to you by your health care provider. Make sure you discuss any questions you have with your health care provider.

## 2014-11-20 NOTE — ED Notes (Signed)
Patient in Ultrasound.

## 2014-11-20 NOTE — ED Notes (Signed)
Pt left prior to receiving zithromax doseGreta Doom- Reid, EDPA notified- states okay to wait for cultures to result and re-order at that time if necessary

## 2014-11-21 LAB — URINE CULTURE: Special Requests: NORMAL

## 2014-11-22 LAB — GC/CHLAMYDIA PROBE AMP
CT Probe RNA: NEGATIVE
GC PROBE AMP APTIMA: NEGATIVE

## 2014-12-01 ENCOUNTER — Emergency Department (HOSPITAL_BASED_OUTPATIENT_CLINIC_OR_DEPARTMENT_OTHER): Payer: Medicaid Other

## 2014-12-01 ENCOUNTER — Emergency Department (HOSPITAL_BASED_OUTPATIENT_CLINIC_OR_DEPARTMENT_OTHER)
Admission: EM | Admit: 2014-12-01 | Discharge: 2014-12-01 | Disposition: A | Discharge status: Home / Self Care | Payer: Medicaid Other | Attending: Emergency Medicine | Admitting: Emergency Medicine

## 2014-12-01 ENCOUNTER — Encounter (HOSPITAL_BASED_OUTPATIENT_CLINIC_OR_DEPARTMENT_OTHER): Payer: Self-pay | Admitting: *Deleted

## 2014-12-01 DIAGNOSIS — O368910 Maternal care for other specified fetal problems, first trimester, not applicable or unspecified: Secondary | ICD-10-CM | POA: Insufficient documentation

## 2014-12-01 DIAGNOSIS — O2341 Unspecified infection of urinary tract in pregnancy, first trimester: Secondary | ICD-10-CM | POA: Insufficient documentation

## 2014-12-01 DIAGNOSIS — Z3490 Encounter for supervision of normal pregnancy, unspecified, unspecified trimester: Secondary | ICD-10-CM

## 2014-12-01 DIAGNOSIS — Z79899 Other long term (current) drug therapy: Secondary | ICD-10-CM | POA: Diagnosis not present

## 2014-12-01 DIAGNOSIS — Z880 Allergy status to penicillin: Secondary | ICD-10-CM | POA: Diagnosis not present

## 2014-12-01 DIAGNOSIS — O9989 Other specified diseases and conditions complicating pregnancy, childbirth and the puerperium: Secondary | ICD-10-CM | POA: Diagnosis present

## 2014-12-01 DIAGNOSIS — Z3A01 Less than 8 weeks gestation of pregnancy: Secondary | ICD-10-CM | POA: Insufficient documentation

## 2014-12-01 DIAGNOSIS — O23511 Infections of cervix in pregnancy, first trimester: Secondary | ICD-10-CM | POA: Diagnosis not present

## 2014-12-01 DIAGNOSIS — O209 Hemorrhage in early pregnancy, unspecified: Secondary | ICD-10-CM | POA: Insufficient documentation

## 2014-12-01 DIAGNOSIS — O418X10 Other specified disorders of amniotic fluid and membranes, first trimester, not applicable or unspecified: Secondary | ICD-10-CM

## 2014-12-01 DIAGNOSIS — N72 Inflammatory disease of cervix uteri: Secondary | ICD-10-CM

## 2014-12-01 DIAGNOSIS — N939 Abnormal uterine and vaginal bleeding, unspecified: Secondary | ICD-10-CM

## 2014-12-01 DIAGNOSIS — O99011 Anemia complicating pregnancy, first trimester: Secondary | ICD-10-CM | POA: Insufficient documentation

## 2014-12-01 DIAGNOSIS — N390 Urinary tract infection, site not specified: Secondary | ICD-10-CM

## 2014-12-01 DIAGNOSIS — D649 Anemia, unspecified: Secondary | ICD-10-CM | POA: Insufficient documentation

## 2014-12-01 DIAGNOSIS — O468X1 Other antepartum hemorrhage, first trimester: Secondary | ICD-10-CM

## 2014-12-01 LAB — WET PREP, GENITAL
Clue Cells Wet Prep HPF POC: NONE SEEN
TRICH WET PREP: NONE SEEN
Yeast Wet Prep HPF POC: NONE SEEN

## 2014-12-01 LAB — URINALYSIS, ROUTINE W REFLEX MICROSCOPIC
BILIRUBIN URINE: NEGATIVE
Glucose, UA: NEGATIVE mg/dL
Ketones, ur: NEGATIVE mg/dL
Nitrite: NEGATIVE
PH: 6.5 (ref 5.0–8.0)
Protein, ur: NEGATIVE mg/dL
SPECIFIC GRAVITY, URINE: 1.023 (ref 1.005–1.030)
UROBILINOGEN UA: 1 mg/dL (ref 0.0–1.0)

## 2014-12-01 LAB — URINE MICROSCOPIC-ADD ON

## 2014-12-01 LAB — PREGNANCY, URINE: Preg Test, Ur: POSITIVE — AB

## 2014-12-01 LAB — ABO/RH: ABO/RH(D): A POS

## 2014-12-01 LAB — HCG, QUANTITATIVE, PREGNANCY: hCG, Beta Chain, Quant, S: 3224 m[IU]/mL — ABNORMAL HIGH (ref <5)

## 2014-12-01 MED ORDER — CEPHALEXIN 500 MG PO CAPS: 500.0000 mg | ORAL_CAPSULE | Freq: Two times a day (BID) | ORAL | Status: DC

## 2014-12-01 MED ORDER — CEFTRIAXONE SODIUM 250 MG IJ SOLR
250.0000 mg | Freq: Once | INTRAMUSCULAR | Status: AC
  Administered 2014-12-01: 250 mg via INTRAMUSCULAR
  Filled 2014-12-01: qty 250

## 2014-12-01 MED ORDER — AZITHROMYCIN 250 MG PO TABS
1000.0000 mg | ORAL_TABLET | Freq: Once | ORAL | Status: AC
  Administered 2014-12-01: 1000 mg via ORAL
  Filled 2014-12-01: qty 4

## 2014-12-01 NOTE — ED Notes (Signed)
Lower abd pain and "spotting" last evening- pt reports she is 6wk preg

## 2014-12-01 NOTE — Discharge Instructions (Signed)
Your tests for gonorrhea, chlamydia, syphilis and HIV on 11/20/14 were negative.  Cervicitis Cervicitis is a soreness and swelling (inflammation) of the cervix. Your cervix is located at the bottom of your uterus. It opens up to the vagina. CAUSES   Sexually transmitted infections (STIs).   Allergic reaction.   Medicines or birth control devices that are put in the vagina.   Injury to the cervix.   Bacterial infections.  RISK FACTORS You are at greater risk if you:  Have unprotected sexual intercourse.  Have sexual intercourse with many partners.  Began sexual intercourse at an early age.  Have a history of STIs. SYMPTOMS  There may be no symptoms. If symptoms occur, they may include:   Gray, white, yellow, or bad-smelling vaginal discharge.   Pain or itching of the area outside the vagina.   Painful sexual intercourse.   Lower abdominal or lower back pain, especially during intercourse.   Frequent urination.   Abnormal vaginal bleeding between periods, after sexual intercourse, or after menopause.   Pressure or a heavy feeling in the pelvis.  DIAGNOSIS  Diagnosis is made after a pelvic exam. Other tests may include:   Examination of any discharge under a microscope (wet prep).   A Pap test.  TREATMENT  Treatment will depend on the cause of cervicitis. If it is caused by an STI, both you and your partner will need to be treated. Antibiotic medicines will be given.  HOME CARE INSTRUCTIONS   Do not have sexual intercourse until your health care provider says it is okay.   Do not have sexual intercourse until your partner has been treated, if your cervicitis is caused by an STI.   Take your antibiotics as directed. Finish them even if you start to feel better.  SEEK MEDICAL CARE IF:  Your symptoms come back.   You have a fever.  MAKE SURE YOU:   Understand these instructions.  Will watch your condition.  Will get help right away if  you are not doing well or get worse. Document Released: 11/19/2005 Document Revised: 11/24/2013 Document Reviewed: 05/13/2013 [facility] Patient Information 2015 [place], [place]. This information is not intended to replace advice given to you by your health care provider. Make sure you discuss any questions you have with your health care provider.  First Trimester of Pregnancy The first trimester of pregnancy is from week 1 until the end of week 12 (months 1 through 3). A week after a sperm fertilizes an egg, the egg will implant on the wall of the uterus. This embryo will begin to develop into a baby. Genes from you and your partner are forming the baby. The female genes determine whether the baby is a boy or a girl. At 6-8 weeks, the eyes and face are formed, and the heartbeat can be seen on ultrasound. At the end of 12 weeks, all the baby's organs are formed.  Now that you are pregnant, you will want to do everything you can to have a healthy baby. Two of the most important things are to get good prenatal care and to follow your health care provider's instructions. Prenatal care is all the medical care you receive before the baby's birth. This care will help prevent, find, and treat any problems during the pregnancy and childbirth. BODY CHANGES Your body goes through many changes during pregnancy. The changes vary from woman to woman.   You may gain or lose a couple of pounds at first.  You may feel  sick to your stomach (nauseous) and throw up (vomit). If the vomiting is uncontrollable, call your health care provider.  You may tire easily.  You may develop headaches that can be relieved by medicines approved by your health care provider.  You may urinate more often. Painful urination may mean you have a bladder infection.  You may develop heartburn as a result of your pregnancy.  You may develop constipation because certain hormones are causing the muscles that push waste through your intestines  to slow down.  You may develop hemorrhoids or swollen, bulging veins (varicose veins).  Your breasts may begin to grow larger and become tender. Your nipples may stick out more, and the tissue that surrounds them (areola) may become darker.  Your gums may bleed and may be sensitive to brushing and flossing.  Dark spots or blotches (chloasma, mask of pregnancy) may develop on your face. This will likely fade after the baby is born.  Your menstrual periods will stop.  You may have a loss of appetite.  You may develop cravings for certain kinds of food.  You may have changes in your emotions from day to day, such as being excited to be pregnant or being concerned that something may go wrong with the pregnancy and baby.  You may have more vivid and strange dreams.  You may have changes in your hair. These can include thickening of your hair, rapid growth, and changes in texture. Some women also have hair loss during or after pregnancy, or hair that feels dry or thin. Your hair will most likely return to normal after your baby is born. WHAT TO EXPECT AT YOUR PRENATAL VISITS During a routine prenatal visit:  You will be weighed to make sure you and the baby are growing normally.  Your blood pressure will be taken.  Your abdomen will be measured to track your baby's growth.  The fetal heartbeat will be listened to starting around week 10 or 12 of your pregnancy.  Test results from any previous visits will be discussed. Your health care provider may ask you:  How you are feeling.  If you are feeling the baby move.  If you have had any abnormal symptoms, such as leaking fluid, bleeding, severe headaches, or abdominal cramping.  If you have any questions. Other tests that may be performed during your first trimester include:  Blood tests to find your blood type and to check for the presence of any previous infections. They will also be used to check for low iron levels (anemia) and  Rh antibodies. Later in the pregnancy, blood tests for diabetes will be done along with other tests if problems develop.  Urine tests to check for infections, diabetes, or protein in the urine.  An ultrasound to confirm the proper growth and development of the baby.  An amniocentesis to check for possible genetic problems.  Fetal screens for spina bifida and Down syndrome.  You may need other tests to make sure you and the baby are doing well. HOME CARE INSTRUCTIONS  Medicines  Follow your health care provider's instructions regarding medicine use. Specific medicines may be either safe or unsafe to take during pregnancy.  Take your prenatal vitamins as directed.  If you develop constipation, try taking a stool softener if your health care provider approves. Diet  Eat regular, well-balanced meals. Choose a variety of foods, such as meat or vegetable-based protein, fish, milk and low-fat dairy products, vegetables, fruits, and whole grain breads and cereals. Your  health care provider will help you determine the amount of weight gain that is right for you.  Avoid raw meat and uncooked cheese. These carry germs that can cause birth defects in the baby.  Eating four or five small meals rather than three large meals a day may help relieve nausea and vomiting. If you start to feel nauseous, eating a few soda crackers can be helpful. Drinking liquids between meals instead of during meals also seems to help nausea and vomiting.  If you develop constipation, eat more high-fiber foods, such as fresh vegetables or fruit and whole grains. Drink enough fluids to keep your urine clear or pale yellow. Activity and Exercise  Exercise only as directed by your health care provider. Exercising will help you:  Control your weight.  Stay in shape.  Be prepared for labor and delivery.  Experiencing pain or cramping in the lower abdomen or low back is a good sign that you should stop exercising. Check  with your health care provider before continuing normal exercises.  Try to avoid standing for long periods of time. Move your legs often if you must stand in one place for a long time.  Avoid heavy lifting.  Wear low-heeled shoes, and practice good posture.  You may continue to have sex unless your health care provider directs you otherwise. Relief of Pain or Discomfort  Wear a good support bra for breast tenderness.   Take warm sitz baths to soothe any pain or discomfort caused by hemorrhoids. Use hemorrhoid cream if your health care provider approves.   Rest with your legs elevated if you have leg cramps or low back pain.  If you develop varicose veins in your legs, wear support hose. Elevate your feet for 15 minutes, 3-4 times a day. Limit salt in your diet. Prenatal Care  Schedule your prenatal visits by the twelfth week of pregnancy. They are usually scheduled monthly at first, then more often in the last 2 months before delivery.  Write down your questions. Take them to your prenatal visits.  Keep all your prenatal visits as directed by your health care provider. Safety  Wear your seat belt at all times when driving.  Make a list of emergency phone numbers, including numbers for family, friends, the hospital, and police and fire departments. General Tips  Ask your health care provider for a referral to a local prenatal education class. Begin classes no later than at the beginning of month 6 of your pregnancy.  Ask for help if you have counseling or nutritional needs during pregnancy. Your health care provider can offer advice or refer you to specialists for help with various needs.  Do not use hot tubs, steam rooms, or saunas.  Do not douche or use tampons or scented sanitary pads.  Do not cross your legs for long periods of time.  Avoid cat litter boxes and soil used by cats. These carry germs that can cause birth defects in the baby and possibly loss of the fetus  by miscarriage or stillbirth.  Avoid all smoking, herbs, alcohol, and medicines not prescribed by your health care provider. Chemicals in these affect the formation and growth of the baby.  Schedule a dentist appointment. At home, brush your teeth with a soft toothbrush and be gentle when you floss. SEEK MEDICAL CARE IF:   You have dizziness.  You have mild pelvic cramps, pelvic pressure, or nagging pain in the abdominal area.  You have persistent nausea, vomiting, or diarrhea.  You  have a bad smelling vaginal discharge.  You have pain with urination.  You notice increased swelling in your face, hands, legs, or ankles. SEEK IMMEDIATE MEDICAL CARE IF:   You have a fever.  You are leaking fluid from your vagina.  You have spotting or bleeding from your vagina.  You have severe abdominal cramping or pain.  You have rapid weight gain or loss.  You vomit blood or material that looks like coffee grounds.  You are exposed to [place] measles and have never had them.  You are exposed to fifth disease or chickenpox.  You develop a severe headache.  You have shortness of breath.  You have any kind of trauma, such as from a fall or a car accident. Document Released: 11/13/2001 Document Revised: 04/05/2014 Document Reviewed: 09/29/2013 [facility] Patient Information 2015 [place]. This information is not intended to replace advice given to you by your health care provider. Make sure you discuss any questions you have with your health care provider.  Urinary Tract Infection Urinary tract infections (UTIs) can develop anywhere along your urinary tract. Your urinary tract is your body's drainage system for removing wastes and extra water. Your urinary tract includes two kidneys, two ureters, a bladder, and a urethra. Your kidneys are a pair of bean-shaped organs. Each kidney is about the size of your fist. They are located below your ribs, one on each side of your  spine. CAUSES Infections are caused by microbes, which are microscopic organisms, including fungi, viruses, and bacteria. These organisms are so small that they can only be seen through a microscope. Bacteria are the microbes that most commonly cause UTIs. SYMPTOMS  Symptoms of UTIs may vary by age and gender of the patient and by the location of the infection. Symptoms in young women typically include a frequent and intense urge to urinate and a painful, burning feeling in the bladder or urethra during urination. Older women and men are more likely to be tired, shaky, and weak and have muscle aches and abdominal pain. A fever may mean the infection is in your kidneys. Other symptoms of a kidney infection include pain in your back or sides below the ribs, nausea, and vomiting. DIAGNOSIS To diagnose a UTI, your caregiver will ask you about your symptoms. Your caregiver also will ask to provide a urine sample. The urine sample will be tested for bacteria and white blood cells. White blood cells are made by your body to help fight infection. TREATMENT  Typically, UTIs can be treated with medication. Because most UTIs are caused by a bacterial infection, they usually can be treated with the use of antibiotics. The choice of antibiotic and length of treatment depend on your symptoms and the type of bacteria causing your infection. HOME CARE INSTRUCTIONS  If you were prescribed antibiotics, take them exactly as your caregiver instructs you. Finish the medication even if you feel better after you have only taken some of the medication.  Drink enough water and fluids to keep your urine clear or pale yellow.  Avoid caffeine, tea, and carbonated beverages. They tend to irritate your bladder.  Empty your bladder often. Avoid holding urine for long periods of time.  Empty your bladder before and after sexual intercourse.  After a bowel movement, women should cleanse from front to back. Use each tissue only  once. SEEK MEDICAL CARE IF:   You have back pain.  You develop a fever.  Your symptoms do not begin to resolve within 3 days.  SEEK IMMEDIATE MEDICAL CARE IF:   You have severe back pain or lower abdominal pain.  You develop chills.  You have nausea or vomiting.  You have continued burning or discomfort with urination. MAKE SURE YOU:   Understand these instructions.  Will watch your condition.  Will get help right away if you are not doing well or get worse. Document Released: 08/29/2005 Document Revised: 05/20/2012 Document Reviewed: 12/28/2011 [facility] Patient Information 2015 [place], [place]. This information is not intended to replace advice given to you by your health care provider. Make sure you discuss any questions you have with your health care provider.  Vaginal Bleeding During Pregnancy, First Trimester A small amount of bleeding (spotting) from the vagina is relatively common in early pregnancy. It usually stops on its own. Various things may cause bleeding or spotting in early pregnancy. Some bleeding may be related to the pregnancy, and some may not. In most cases, the bleeding is normal and is not a problem. However, bleeding can also be a sign of something serious. Be sure to tell your health care provider about any vaginal bleeding right away. Some possible causes of vaginal bleeding during the first trimester include:  Infection or inflammation of the cervix.  Growths (polyps) on the cervix.  Miscarriage or threatened miscarriage.  Pregnancy tissue has developed outside of the uterus and in a fallopian tube (tubal pregnancy).  Tiny cysts have developed in the uterus instead of pregnancy tissue (molar pregnancy). HOME CARE INSTRUCTIONS  Watch your condition for any changes. The following actions may help to lessen any discomfort you are feeling:  Follow your health care provider's instructions for limiting your activity. If your health care provider orders  bed rest, you may need to stay in bed and only get up to use the bathroom. However, your health care provider may allow you to continue light activity.  If needed, make plans for someone to help with your regular activities and responsibilities while you are on bed rest.  Keep track of the number of pads you use each day, how often you change pads, and how soaked (saturated) they are. Write this down.  Do not use tampons. Do not douche.  Do not have sexual intercourse or orgasms until approved by your health care provider.  If you pass any tissue from your vagina, save the tissue so you can show it to your health care provider.  Only take over-the-counter or prescription medicines as directed by your health care provider.  Do not take aspirin because it can make you bleed.  Keep all follow-up appointments as directed by your health care provider. SEEK MEDICAL CARE IF:  You have any vaginal bleeding during any part of your pregnancy.  You have cramps or labor pains.  You have a fever, not controlled by medicine. SEEK IMMEDIATE MEDICAL CARE IF:   You have severe cramps in your back or belly (abdomen).  You pass large clots or tissue from your vagina.  Your bleeding increases.  You feel light-headed or weak, or you have fainting episodes.  You have chills.  You are leaking fluid or have a gush of fluid from your vagina.  You pass out while having a bowel movement. MAKE SURE YOU:  Understand these instructions.  Will watch your condition.  Will get help right away if you are not doing well or get worse. Document Released: 08/29/2005 Document Revised: 11/24/2013 Document Reviewed: 07/27/2013 [facility] Patient Information 2015 [place], [place]. This information is not intended to replace  advice given to you by your health care provider. Make sure you discuss any questions you have with your health care provider.    [facility]  [email or web address] [address] [place], [place] [phone]    [facility] [email or web address] [address] [place], [place] [phone]    [facility] [address] [place], [place] [phone]   [email or web address] [address] [place], [place] [phone]   [facility] [email or web address] [address] [place], [place] [phone]   [email or web address] [address] [place], [place] [phone]

## 2014-12-01 NOTE — ED Provider Notes (Signed)
TIME SEEN: 1:50 PM  CHIEF COMPLAINT: Abdominal pain, vaginal bleeding  HPI: Patient is a 38 year old G5 P2 A2 whose last menstrual period was 10/20/14 who is currently pregnant who presents emergency department with lower abdominal pressure, spotting that started last night. She is not sure where the bleeding is coming from she has only noticed it with wiping and in her underwear. No dysuria. No vaginal discharge. Patient had a transvaginal ultrasound on 11/20/14 which showed a possible intrauterine gestational sac. At that time showed an hCG of 791 and was advised to follow-up with an OB/GYN to have this rechecked but states she has not done that yet. Denies any fevers, chills, nausea, vomiting or diarrhea. Has had Chlamydia in the past.  ROS: See HPI Constitutional: no fever  Eyes: no drainage  ENT: no runny nose   Cardiovascular:  no chest pain  Resp: no SOB  GI: no vomiting GU: no dysuria Integumentary: no rash  Allergy: no hives  Musculoskeletal: no leg swelling  Neurological: no slurred speech ROS otherwise negative  PAST MEDICAL HISTORY/PAST SURGICAL HISTORY:  Past Medical History  Diagnosis Date  . Anemia   . Pregnancy     MEDICATIONS:  Prior to Admission medications   Medication Sig Start Date End Date Taking? Authorizing Provider  Prenatal Vit-Fe Fumarate-FA (PRENATAL MULTIVITAMIN) TABS tablet Take 1 tablet by mouth daily at 12 noon.   Yes Historical Provider, MD  Biotin 5000 MCG CAPS Take 1 capsule by mouth daily.      Historical Provider, MD  calcium carbonate (OS-CAL) 600 MG TABS tablet Take 600 mg by mouth 2 (two) times daily with a meal.    Historical Provider, MD  Multiple Vitamins-Minerals (WOMENS MULTIVITAMIN PLUS PO) Take by mouth.    Historical Provider, MD  nitrofurantoin, macrocrystal-monohydrate, (MACROBID) 100 MG capsule Take 1 capsule (100 mg total) by mouth 2 (two) times daily. 11/20/14   Fayrene HelperBowie Tran, PA-C    ALLERGIES:  Allergies  Allergen Reactions   . Penicillins     Throat itches and hand and neck swell    SOCIAL HISTORY:  History  Substance Use Topics  . Smoking status: Never Smoker   . Smokeless tobacco: Not on file  . Alcohol Use: Yes     Comment: socially    FAMILY HISTORY: No family history on file.  EXAM: BP 121/76 mmHg  Pulse 85  Temp(Src) 99.3 F (37.4 C) (Oral)  Resp 18  Ht 5\' 8"  (1.727 m)  Wt 225 lb (102.059 kg)  BMI 34.22 kg/m2  SpO2 100%  LMP 10/20/2014 CONSTITUTIONAL: Alert and oriented and responds appropriately to questions. Well-appearing; well-nourished HEAD: Normocephalic EYES: Conjunctivae clear, PERRL ENT: normal nose; no rhinorrhea; moist mucous membranes; pharynx without lesions noted NECK: Supple, no meningismus, no LAD  CARD: RRR; S1 and S2 appreciated; no murmurs, no clicks, no rubs, no gallops RESP: Normal chest excursion without splinting or tachypnea; breath sounds clear and equal bilaterally; no wheezes, no rhonchi, no rales,  ABD/GI: Normal bowel sounds; non-distended; soft, non-tender, no rebound, no guarding, obese, no peritoneal signs GU:  Normal external genitalia; patient has a moderate amount of yellow vaginal discharge with some blood, cervical os is thick and closed and high, her cervix is mildly friable and slightly tender, no adnexal tenderness or fullness BACK:  The back appears normal and is non-tender to palpation, there is no CVA tenderness EXT: Normal ROM in all joints; non-tender to palpation; no edema; normal capillary refill; no cyanosis    SKIN: Normal  color for age and race; warm NEURO: Moves all extremities equally PSYCH: The patient's mood and manner are appropriate. Grooming and personal hygiene are appropriate.  MEDICAL DECISION MAKING: Patient here with vaginal bleeding and pregnancy. We'll obtain Rh status, hCG, urinalysis, pelvic cultures, transvaginal ultrasound.  ED PROGRESS: Patient's hCG has risen appropriately and is now 3224. She is A positive and does  not need RhoGAM. Wet prep shows too numerous to count WBCs but no other sign of infection. Given her copious amounts of vaginal discharge and minimal cervical motion tenderness treated her for possible cervicitis with azithromycin and ceftriaxone.  Urine does show small hemoglobin, leukocytes and many bacteria. Will treat with Keflex for possible UTI. Culture pending.  Transvaginal ultrasound shows a 6 week intrauterine pregnancy with fetal heart rate between 96-100 bpm. She has a small subchorionic hemorrhage. Will give her outpatient follow-up information with local OB/GYN's.. Have discussed bleeding return precautions. Patient verbalizes understanding and is comfortable with plan.     Layla MawKristen N Tatem Holsonback, DO 12/01/14 [phone]

## 2014-12-02 LAB — URINE CULTURE: Colony Count: 50000

## 2014-12-02 LAB — GC/CHLAMYDIA PROBE AMP
CT Probe RNA: NEGATIVE
GC Probe RNA: NEGATIVE

## 2014-12-03 NOTE — ED Notes (Signed)
Patient called to state that she did not get her prescription when she was discharged or was her prescription send to the [facility] on her chart.  Explained that the prescription was probably in our OP pharmacy.  Prescription info left on provider call list at [facility].

## 2014-12-07 ENCOUNTER — Inpatient Hospital Stay (HOSPITAL_COMMUNITY)
Admission: AD | Admit: 2014-12-07 | Discharge: 2014-12-08 | Disposition: A | Discharge status: Home / Self Care | Payer: Medicaid Other | Source: Ambulatory Visit | Attending: Obstetrics & Gynecology | Admitting: Obstetrics & Gynecology

## 2014-12-07 ENCOUNTER — Inpatient Hospital Stay (HOSPITAL_COMMUNITY): Payer: Medicaid Other

## 2014-12-07 ENCOUNTER — Encounter (HOSPITAL_COMMUNITY): Payer: Self-pay | Admitting: *Deleted

## 2014-12-07 DIAGNOSIS — Z3A01 Less than 8 weeks gestation of pregnancy: Secondary | ICD-10-CM | POA: Insufficient documentation

## 2014-12-07 DIAGNOSIS — O209 Hemorrhage in early pregnancy, unspecified: Secondary | ICD-10-CM | POA: Diagnosis present

## 2014-12-07 DIAGNOSIS — O039 Complete or unspecified spontaneous abortion without complication: Secondary | ICD-10-CM | POA: Diagnosis not present

## 2014-12-07 LAB — CBC
HEMATOCRIT: 33.2 % — AB (ref 36.0–46.0)
Hemoglobin: 11.1 g/dL — ABNORMAL LOW (ref 12.0–15.0)
MCH: 30.8 pg (ref 26.0–34.0)
MCHC: 33.4 g/dL (ref 30.0–36.0)
MCV: 92.2 fL (ref 78.0–100.0)
PLATELETS: 299 10*3/uL (ref 150–400)
RBC: 3.6 MIL/uL — AB (ref 3.87–5.11)
RDW: 13.8 % (ref 11.5–15.5)
WBC: 9.5 10*3/uL (ref 4.0–10.5)

## 2014-12-07 NOTE — MAU Provider Note (Signed)
History     CSN: [id number]  Arrival date and time: 12/07/14 7829   First Provider Initiated Contact with Patient 12/07/14 2218      No chief complaint on file.  HPI  Tiffany Reid is a 39 y.o. G5P2020 at [redacted]w[redacted]d who presents today with vaginal bleeding. She has had spotting, but today it was like a period. She has had some clots about the size of a pea. She does not feel she has passed any tissue at this time. She denies any pain today. She has not started prenatal care at this time.  Past Medical History  Diagnosis Date  . Anemia   . Pregnancy     No past surgical history on file.  No family history on file.  History  Substance Use Topics  . Smoking status: Never Smoker   . Smokeless tobacco: Not on file  . Alcohol Use: Yes     Comment: socially    Allergies:  Allergies  Allergen Reactions  . Penicillins Itching and Swelling    Throat itches and hand and neck swell    Prescriptions prior to admission  Medication Sig Dispense Refill Last Dose  . cephALEXin (KEFLEX) 500 MG capsule Take 1 capsule (500 mg total) by mouth 2 (two) times daily. 14 capsule 0 12/07/2014 at Unknown time  . Prenatal Vit-Fe Fumarate-FA (PRENATAL MULTIVITAMIN) TABS tablet Take 1 tablet by mouth daily at 12 noon.   12/07/2014 at Unknown time  . nitrofurantoin, macrocrystal-monohydrate, (MACROBID) 100 MG capsule Take 1 capsule (100 mg total) by mouth 2 (two) times daily. (Patient not taking: Reported on 12/07/2014) 10 capsule 0 Completed Course at Unknown time    ROS Physical Exam   Blood pressure 127/76, pulse 77, temperature 98.6 F (37 C), temperature source Oral, resp. rate 20, height  (1.727 m), weight 103.08 kg (227 lb 4 oz), last menstrual period 10/20/2014.  Physical Exam  Nursing note and vitals reviewed. Constitutional: She is oriented to person, place, and time. She appears well-developed and well-nourished. No distress.  Cardiovascular: Normal rate.   Respiratory: Effort  normal.  GI: Soft. There is no tenderness. There is no rebound.  Neurological: She is alert and oriented to person, place, and time.  Skin: Skin is warm and dry.  Psychiatric: She has a normal mood and affect.    MAU Course  Procedures  Results for orders placed or performed during the hospital encounter of 12/07/14 (from the past 24 hour(s))  CBC     Status: Abnormal   Collection Time: 12/07/14 11:18 PM  Result Value Ref Range   WBC 9.5 4.0 - 10.5 K/uL   RBC 3.60 (L) 3.87 - 5.11 MIL/uL   Hemoglobin 11.1 (L) 12.0 - 15.0 g/dL   HCT 56.2 (L) 13.0 - 86.5 %   MCV 92.2 78.0 - 100.0 fL   MCH 30.8 26.0 - 34.0 pg   MCHC 33.4 30.0 - 36.0 g/dL   RDW 78.4 69.6 - 29.5 %   Platelets 299 150 - 400 K/uL   US Ob Transvaginal  12/07/2014   CLINICAL DATA:  Acute onset of vaginal bleeding.  Initial encounter.  EXAM: TRANSVAGINAL OB ULTRASOUND  TECHNIQUE: Transvaginal ultrasound was performed for complete evaluation of the gestation as well as the maternal uterus, adnexal regions, and pelvic cul-de-sac.  COMPARISON:  Pelvic ultrasound performed 12/01/2014  FINDINGS: Intrauterine gestational sac: None seen.  Yolk sac:  N/A  Embryo:  N/A  Maternal uterus/adnexae: The uterus is unremarkable in appearance.  The  ovaries are within normal limits. The right ovary measures 2.9 x 1.5 x 1.1 cm, while the left ovary measures 3.1 x 1.8 x 1.7 cm. No suspicious adnexal masses are seen; there is no evidence for ovarian torsion.  No free fluid is seen within the pelvic cul-de-sac.  IMPRESSION: No intrauterine gestational sac is now seen. This is compatible with missed spontaneous abortion.   Electronically Signed   By: Roanna RaiderJeffery  Chang M.D.   On: 12/07/2014 23:18     Assessment and Plan   1. SAB (spontaneous abortion)   2. Vaginal bleeding in pregnancy, first trimester    Bleeding precautions  Return to MAU as needed  Follow-up Information    [facility]   Specialty:  Obstetrics and  Gynecology   Why:  December 23 2014 between 8am-11am    Contact information:   [address] [place] [id number] [phone]       Tawnya CrookHogan, Heather Donovan 12/07/2014, 10:19 PM

## 2014-12-07 NOTE — MAU Note (Signed)
PT SAYS SHE WENT  [facility] ON 70  ON 11-20-2014-  TOLD  HER PREG  AND   UTI-   GAVE MEDS   AND DID U/S.     THEN WENT  BACK  ON 12-30-  FOR LOWER ABD PAIN AND LIGHT BLEEDING  WHEN  WIPING.  SO  DID PELVIC-  TOLD CERVIX  INFLAMMED  - STILL  HAVE UTI-  SO GAVE  MORE MEDS AND DID  ANOTHER U/S-  NO SAB.          WHEN SHE STARTED ANTX -  BLEEDING BECAME HEAVIER. X1 WEEK WHEN  WIPED-        THEN TODAY   HAD  TO PUT ON PAD    . NO CRAMPING.    IN REIAGE -   SMALL  AMT LIGHT  PINK- RED.     PLANS TO GO TO [place]   FOR PNC.

## 2014-12-08 ENCOUNTER — Encounter (HOSPITAL_COMMUNITY): Payer: Self-pay | Admitting: *Deleted

## 2014-12-08 DIAGNOSIS — O039 Complete or unspecified spontaneous abortion without complication: Secondary | ICD-10-CM

## 2014-12-08 NOTE — Discharge Instructions (Signed)
Miscarriage A miscarriage is the sudden loss of an unborn baby (fetus) before the 20th week of pregnancy. Most miscarriages happen in the first 3 months of pregnancy. Sometimes, it happens before a woman even knows she is pregnant. A miscarriage is also called a "spontaneous miscarriage" or "early pregnancy loss." Having a miscarriage can be an emotional experience. Talk with your caregiver about any questions you may have about miscarrying, the grieving process, and your future pregnancy plans. CAUSES   Problems with the fetal chromosomes that make it impossible for the baby to develop normally. Problems with the baby's genes or chromosomes are most often the result of errors that occur, by chance, as the embryo divides and grows. The problems are not inherited from the parents.  Infection of the cervix or uterus.   Hormone problems.   Problems with the cervix, such as having an incompetent cervix. This is when the tissue in the cervix is not strong enough to hold the pregnancy.   Problems with the uterus, such as an abnormally shaped uterus, uterine fibroids, or congenital abnormalities.   Certain medical conditions.   Smoking, drinking alcohol, or taking illegal drugs.   Trauma.  Often, the cause of a miscarriage is unknown.  SYMPTOMS   Vaginal bleeding or spotting, with or without cramps or pain.  Pain or cramping in the abdomen or lower back.  Passing fluid, tissue, or blood clots from the vagina. DIAGNOSIS  Your caregiver will perform a physical exam. You may also have an ultrasound to confirm the miscarriage. Blood or urine tests may also be ordered. TREATMENT   Sometimes, treatment is not necessary if you naturally pass all the fetal tissue that was in the uterus. If some of the fetus or placenta remains in the body (incomplete miscarriage), tissue left behind may become infected and must be removed. Usually, a dilation and curettage (D and C) procedure is performed.  During a D and C procedure, the cervix is widened (dilated) and any remaining fetal or placental tissue is gently removed from the uterus.  Antibiotic medicines are prescribed if there is an infection. Other medicines may be given to reduce the size of the uterus (contract) if there is a lot of bleeding.  If you have Rh negative blood and your baby was Rh positive, you will need a Rh immunoglobulin shot. This shot will protect any future baby from having Rh blood problems in future pregnancies. HOME CARE INSTRUCTIONS   Your caregiver may order bed rest or may allow you to continue light activity. Resume activity as directed by your caregiver.  Have someone help with home and family responsibilities during this time.   Keep track of the number of sanitary pads you use each day and how soaked (saturated) they are. Write down this information.   Do not use tampons. Do not douche or have sexual intercourse until approved by your caregiver.   Only take over-the-counter or prescription medicines for pain or discomfort as directed by your caregiver.   Do not take aspirin. Aspirin can cause bleeding.   Keep all follow-up appointments with your caregiver.   If you or your partner have problems with grieving, talk to your caregiver or seek counseling to help cope with the pregnancy loss. Allow enough time to grieve before trying to get pregnant again.  SEEK IMMEDIATE MEDICAL CARE IF:   You have severe cramps or pain in your back or abdomen.  You have a fever.  You pass large blood clots (walnut-sized   or larger) ortissue from your vagina. Save any tissue for your caregiver to inspect.   Your bleeding increases.   You have a thick, bad-smelling vaginal discharge.  You become lightheaded, weak, or you faint.   You have chills.  MAKE SURE YOU:  Understand these instructions.  Will watch your condition.  Will get help right away if you are not doing well or get  worse. Document Released: 05/15/2001 Document Revised: 03/16/2013 Document Reviewed: 01/08/2012 ExitCare Patient Information 2015 [employer]. This information is not intended to replace advice given to you by your health care provider. Make sure you discuss any questions you have with your health care provider.  

## 2014-12-23 ENCOUNTER — Other Ambulatory Visit: Payer: Medicaid Other

## 2014-12-23 DIAGNOSIS — O039 Complete or unspecified spontaneous abortion without complication: Secondary | ICD-10-CM

## 2014-12-23 NOTE — Progress Notes (Signed)
Pt was put on sheet for stat lab. I reviewed chart with Dr. Loreta AveAcosta and patient does not need stat lab as we are following an SAB. Pt had iup on 12/30 ultrasound, and then on 1/5 started bleeding and had no iup on ultrasound.

## 2014-12-24 LAB — HCG, QUANTITATIVE, PREGNANCY: hCG, Beta Chain, Quant, S: <2 m[IU]/mL

## 2014-12-27 ENCOUNTER — Telehealth: Payer: Self-pay

## 2014-12-27 NOTE — Telephone Encounter (Signed)
Called patient and informed her of results and recommendations. Patient states this pregnancy was not planned and she would like to discuss options for contraception management. Informed patient she will receive a call from our front office staff with the next available appointment. Patient verbalized understanding. No questions or concerns. Message sent to admin pool to call patient with appointment for birth control discussion.

## 2014-12-27 NOTE — Telephone Encounter (Signed)
-----   Message from Catalina AntiguaPeggy Constant, MD sent at 12/27/2014  7:58 AM EST ----- Please inform patient of complete resolution of pregnancy. Patient should wait 3 normal cycles before trying to conceive again or come to our office for contraception counseling if this was an unplanned pregnancy  Thanks  Peggy

## 2015-03-05 ENCOUNTER — Emergency Department (HOSPITAL_COMMUNITY)
Admission: EM | Admit: 2015-03-05 | Discharge: 2015-03-06 | Disposition: A | Discharge status: Home / Self Care | Payer: Medicaid Other | Attending: Emergency Medicine | Admitting: Emergency Medicine

## 2015-03-05 ENCOUNTER — Encounter (HOSPITAL_COMMUNITY): Payer: Self-pay | Admitting: Emergency Medicine

## 2015-03-05 ENCOUNTER — Emergency Department (HOSPITAL_COMMUNITY): Payer: Medicaid Other

## 2015-03-05 DIAGNOSIS — Z880 Allergy status to penicillin: Secondary | ICD-10-CM | POA: Insufficient documentation

## 2015-03-05 DIAGNOSIS — R079 Chest pain, unspecified: Secondary | ICD-10-CM

## 2015-03-05 DIAGNOSIS — Z792 Long term (current) use of antibiotics: Secondary | ICD-10-CM | POA: Insufficient documentation

## 2015-03-05 DIAGNOSIS — Z862 Personal history of diseases of the blood and blood-forming organs and certain disorders involving the immune mechanism: Secondary | ICD-10-CM | POA: Diagnosis not present

## 2015-03-05 DIAGNOSIS — Z7982 Long term (current) use of aspirin: Secondary | ICD-10-CM | POA: Insufficient documentation

## 2015-03-05 LAB — CBC
HCT: 34.7 % — ABNORMAL LOW (ref 36.0–46.0)
HEMOGLOBIN: 11.3 g/dL — AB (ref 12.0–15.0)
MCH: 29.7 pg (ref 26.0–34.0)
MCHC: 32.6 g/dL (ref 30.0–36.0)
MCV: 91.3 fL (ref 78.0–100.0)
Platelets: 336 10*3/uL (ref 150–400)
RBC: 3.8 MIL/uL — ABNORMAL LOW (ref 3.87–5.11)
RDW: 13.2 % (ref 11.5–15.5)
WBC: 8.7 10*3/uL (ref 4.0–10.5)

## 2015-03-05 LAB — BASIC METABOLIC PANEL
Anion gap: 8 (ref 5–15)
BUN: 8 mg/dL (ref 6–23)
CO2: 24 mmol/L (ref 19–32)
CREATININE: 0.67 mg/dL (ref 0.50–1.10)
Calcium: 8.7 mg/dL (ref 8.4–10.5)
Chloride: 103 mmol/L (ref 96–112)
GFR calc non Af Amer: >90 mL/min (ref >90)
Glucose, Bld: 111 mg/dL — ABNORMAL HIGH (ref 70–99)
Potassium: 4 mmol/L (ref 3.5–5.1)
Sodium: 135 mmol/L (ref 135–145)

## 2015-03-05 LAB — I-STAT TROPONIN, ED: TROPONIN I, POC: 0 ng/mL (ref 0.00–0.08)

## 2015-03-05 MED ORDER — KETOROLAC TROMETHAMINE 30 MG/ML IJ SOLN
30.0000 mg | Freq: Once | INTRAMUSCULAR | Status: DC
  Filled 2015-03-05: qty 1

## 2015-03-05 NOTE — ED Provider Notes (Signed)
CSN: [id number]     Arrival date & time 03/05/15  2129 History   First MD Initiated Contact with Patient 03/05/15 2151     Chief Complaint  Patient presents with  . Chest Pain     (Consider location/radiation/quality/duration/timing/severity/associated sxs/prior Treatment) HPI Comments: Patient is a 39 year old female past medical history significant for anemia presenting to the emergency department for evaluation of central chest pain with radiation under her left breast. She states the pain began around 11 AM this morning and has been getting progressively worse. She describes it initially as a sore achy feeling in his progress to a sharp stabbing feeling. She states she began to get bilateral hand and feet tingling, lightheadedness, nauseous and began to breathe quickly. Denies any shortness of breath. She states her pain was relieved with nitroglycerin. Denies any pleuritic nature of her chest pain. PERC negative.    Past Medical History  Diagnosis Date  . Anemia   . Pregnancy    History reviewed. No pertinent past surgical history. History reviewed. No pertinent family history. History  Substance Use Topics  . Smoking status: Never Smoker   . Smokeless tobacco: Not on file  . Alcohol Use: Yes     Comment: socially   OB History    Gravida Para Term Preterm AB TAB SAB Ectopic Multiple Living   Review of Systems  Cardiovascular: Positive for chest pain.  All other systems reviewed and are negative.     Allergies  Penicillins  Home Medications   Prior to Admission medications   Medication Sig Start Date End Date Taking? Authorizing Provider  aspirin 81 MG chewable tablet Chew 324 mg by mouth once.   Yes Historical Provider, MD  nitroGLYCERIN (NITROSTAT) 0.4 MG SL tablet Place 0.4 mg under the tongue once.   Yes Historical Provider, MD  cephALEXin (KEFLEX) 500 MG capsule Take 1 capsule (500 mg total) by mouth 2 (two) times daily. 12/01/14   Kristen N  Ward, DO  meloxicam (MOBIC) 7.5 MG tablet Take 2 tablets (15 mg total) by mouth daily. 03/06/15   Maretta Overdorf, PA-C   BP 103/73 mmHg  Pulse 74  Temp(Src) 98.2 F (36.8 C) (Oral)  Resp 15  Ht  (1.753 m)  Wt 225 lb (102.059 kg)  BMI 33.21 kg/m2  SpO2 100%  LMP 10/20/2014 Physical Exam  Constitutional: She is oriented to person, place, and time. She appears well-developed and well-nourished. No distress.  HENT:  Head: Normocephalic and atraumatic.  Right Ear: External ear normal.  Left Ear: External ear normal.  Nose: Nose normal.  Mouth/Throat: Oropharynx is clear and moist.  Eyes: Conjunctivae are normal.  Neck: Normal range of motion. Neck supple.  No nuchal rigidity.   Cardiovascular: Normal rate, regular rhythm and normal heart sounds.   Pulmonary/Chest: Effort normal and breath sounds normal. No respiratory distress. She exhibits tenderness.  Abdominal: Soft. There is no tenderness.  Musculoskeletal: Normal range of motion.  Neurological: She is alert and oriented to person, place, and time.  Skin: Skin is warm and dry. No rash noted. She is not diaphoretic.  Psychiatric: She has a normal mood and affect.  Nursing note and vitals reviewed.   ED Course  Procedures (including critical care time) Medications  HYDROcodone-acetaminophen (HYCET) 7.5-325 mg/15 ml solution 10 mL (10 mLs Oral Given 03/06/15 0030)    Labs Review Labs Reviewed  CBC - Abnormal; Notable for the  following:    RBC 3.80 (*)    Hemoglobin 11.3 (*)    HCT 34.7 (*)    All other components within normal limits  BASIC METABOLIC PANEL - Abnormal; Notable for the following:    Glucose, Bld 111 (*)    All other components within normal limits  I-STAT TROPOININ, ED    Imaging Review Dg Chest 2 View  03/06/2015   CLINICAL DATA:  Acute onset of generalized chest pain and shortness of breath. Initial encounter.  EXAM: CHEST  2 VIEW  COMPARISON:  Chest radiograph performed 01/20/2014  FINDINGS:  The lungs are well-aerated and clear. There is no evidence of focal opacification, pleural effusion or pneumothorax.  The heart is normal in size; the mediastinal contour is within normal limits. No acute osseous abnormalities are seen.  IMPRESSION: No acute cardiopulmonary process seen.   Electronically Signed   By: Roanna RaiderJeffery  Chang M.D.   On: 03/06/2015 00:07     EKG Interpretation None      MDM   Final diagnoses:  Chest pain    Filed Vitals:   03/06/15 0032  BP: 103/73  Pulse: 74  Temp: 98.2 F (36.8 C)  Resp: 15   Afebrile, NAD, non-toxic appearing, AAOx4.  Patient is to be discharged with recommendation to follow up with PCP in regards to today's hospital visit. Chest pain is not likely of cardiac or pulmonary etiology d/t presentation, perc negative, VSS, no tracheal deviation, no JVD or new murmur, RRR, breath sounds equal bilaterally, EKG without acute abnormalities, negative troponin, and negative CXR. Pt has been advised to return to the ED is CP becomes exertional, associated with diaphoresis or nausea, radiates to left jaw/arm, worsens or becomes concerning in any way. Pt appears reliable for follow up and is agreeable to discharge.   Case has been discussed with Dr. Ethelda ChickJacubowitz who agrees with the above plan to discharge.      Francee PiccoloJennifer Idella Lamontagne, PA-C 03/06/15 0540  Doug SouSam Jacubowitz, MD 03/07/15 [phone]

## 2015-03-05 NOTE — ED Notes (Signed)
Per EMS, pt started having chest pressure with radiation to the left back around 1100 this AM while doing laundry. Pt reports dizziness and nausea, rating pain 5/10. Pt received 2 NTG with relief - pain 2/10. Pt also received 324 ASA. CBG - 119

## 2015-03-06 MED ORDER — MELOXICAM 7.5 MG PO TABS: 15.0000 mg | ORAL_TABLET | Freq: Every day | ORAL | Status: DC

## 2015-03-06 MED ORDER — HYDROCODONE-ACETAMINOPHEN 7.5-325 MG/15ML PO SOLN
10.0000 mL | Freq: Once | ORAL | Status: AC
  Administered 2015-03-06: 10 mL via ORAL
  Filled 2015-03-06: qty 15

## 2015-03-06 NOTE — Discharge Instructions (Signed)
Please follow up with your primary care physician in 1-2 days. If you do not have one please call the [facility] number listed above. Please read all discharge instructions and return precautions.  ° ° °Chest Wall Pain °Chest wall pain is pain in or around the bones and muscles of your chest. It may take up to 6 weeks to get better. It may take longer if you must stay physically active in your work and activities.  °CAUSES  °Chest wall pain may happen on its own. However, it may be caused by: °· A viral illness like the flu. °· Injury. °· Coughing. °· Exercise. °· Arthritis. °· Fibromyalgia. °· Shingles. °HOME CARE INSTRUCTIONS  °· Avoid overtiring physical activity. Try not to strain or perform activities that cause pain. This includes any activities using your chest or your abdominal and side muscles, especially if heavy weights are used. °· Put ice on the sore area. °¨ Put ice in a plastic bag. °¨ Place a towel between your skin and the bag. °¨ Leave the ice on for 15-20 minutes per hour while awake for the first 2 days. °· Only take over-the-counter or prescription medicines for pain, discomfort, or fever as directed by your caregiver. °SEEK IMMEDIATE MEDICAL CARE IF:  °· Your pain increases, or you are very uncomfortable. °· You have a fever. °· Your chest pain becomes worse. °· You have new, unexplained symptoms. °· You have nausea or vomiting. °· You feel sweaty or lightheaded. °· You have a cough with phlegm (sputum), or you cough up blood. °MAKE SURE YOU:  °· Understand these instructions. °· Will watch your condition. °· Will get help right away if you are not doing well or get worse. °Document Released: 11/19/2005 Document Revised: 02/11/2012 Document Reviewed: 07/16/2011 °ExitCare® Patient Information ©2015 [employer]. This information is not intended to replace advice given to you by your health care provider. Make sure you discuss any questions you have with your health care  provider. ° °

## 2015-10-12 ENCOUNTER — Encounter (HOSPITAL_COMMUNITY): Payer: Self-pay | Admitting: *Deleted

## 2015-10-24 IMAGING — US US OB TRANSVAGINAL
1 series · 14 of 28 positions shown · non-contrast
Comparison: 11/20/2014

CLINICAL DATA: Pelvic pain, spotting. Six weeks pregnant by last
menstrual period.

EXAM:
OBSTETRIC <14 WK US AND TRANSVAGINAL OB US
TECHNIQUE: Both transabdominal and transvaginal ultrasound examinations were
performed for complete evaluation of the gestation as well as the
maternal uterus, adnexal regions, and pelvic cul-de-sac.
Transvaginal technique was performed to assess early pregnancy.

[Series 1: us ob transvaginal · 0.25mm/px · 30 acquisitions, 14 frames shown]
[im 2/30]
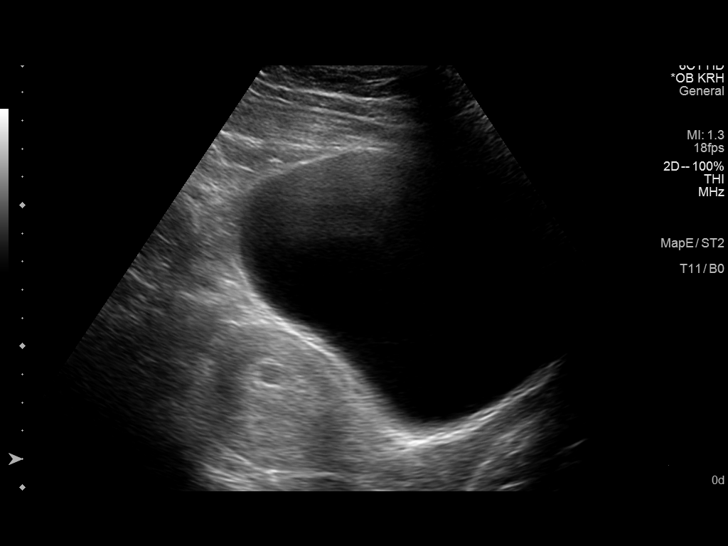
[im 4/30]
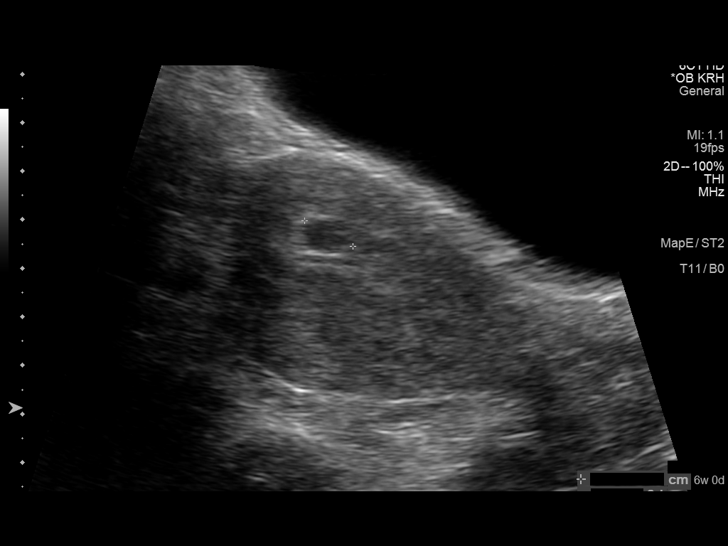
[im 6/30]
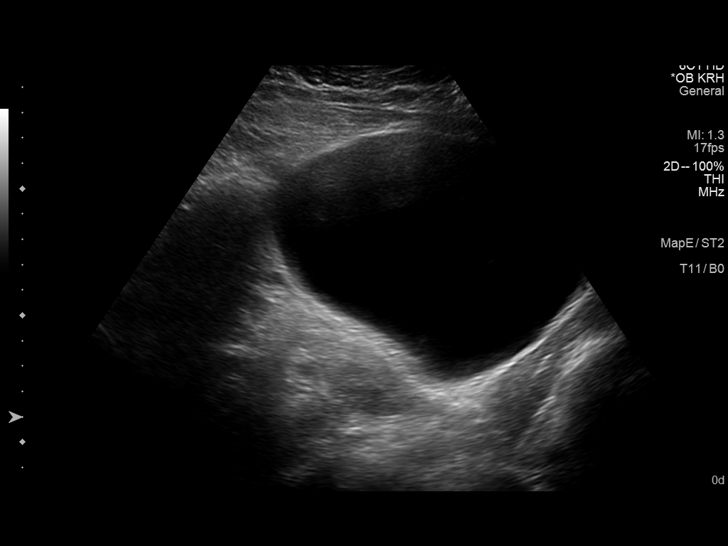
[im 8/30]
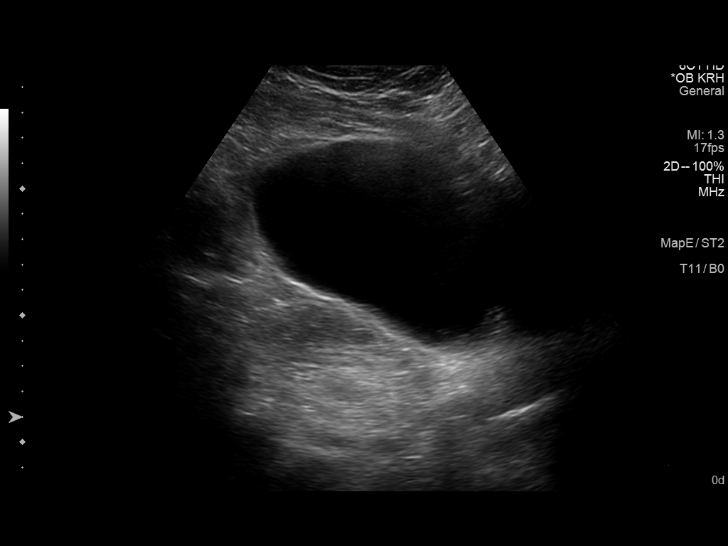
[im 10/30]
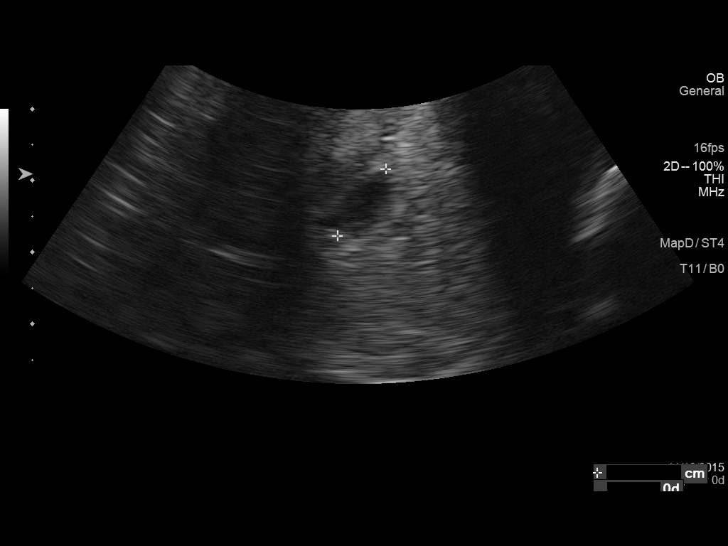
[im 12/30]
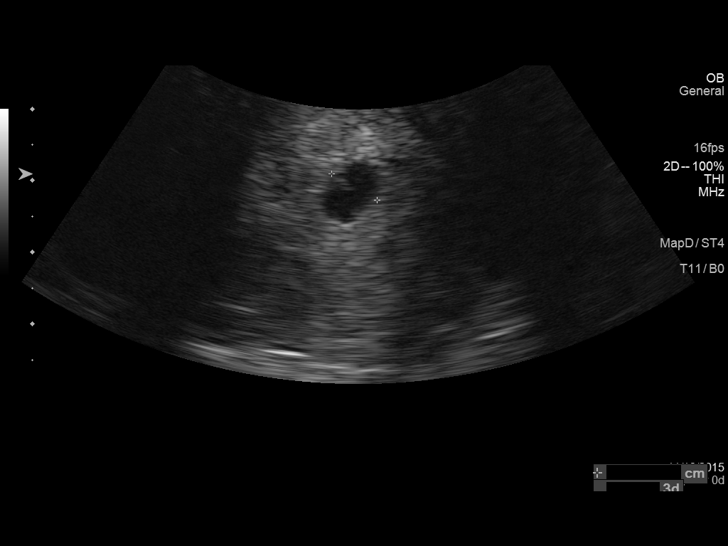
[im 14/30]
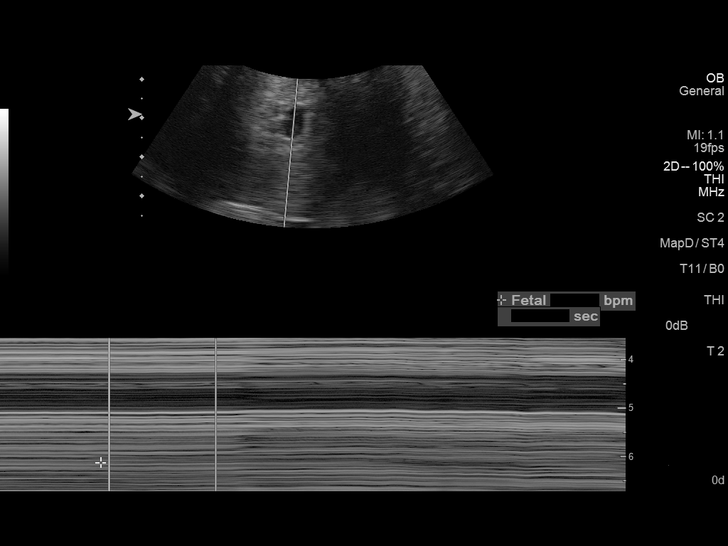
[im 17/30]
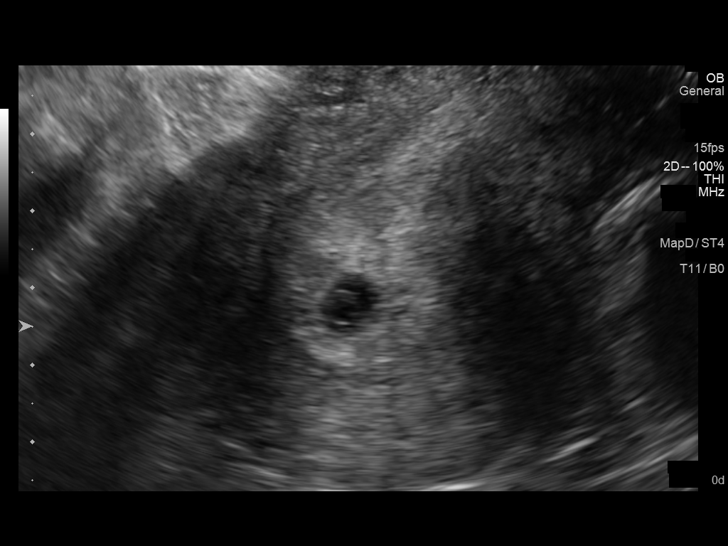
[im 19/30]
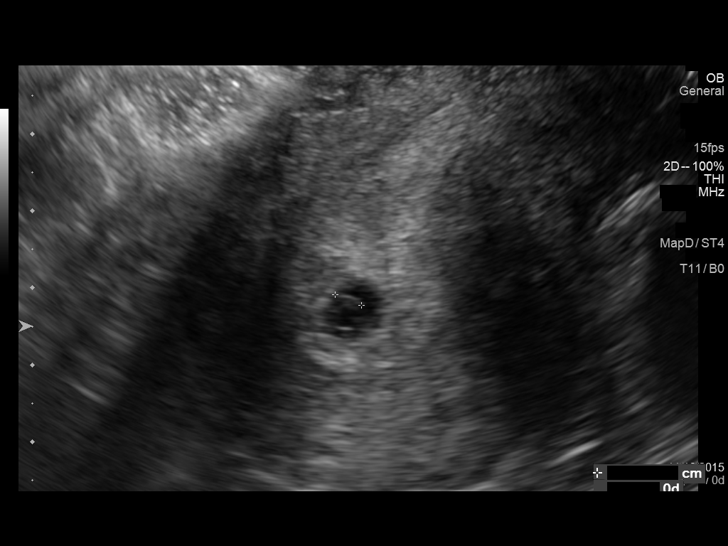
[im 21/30]
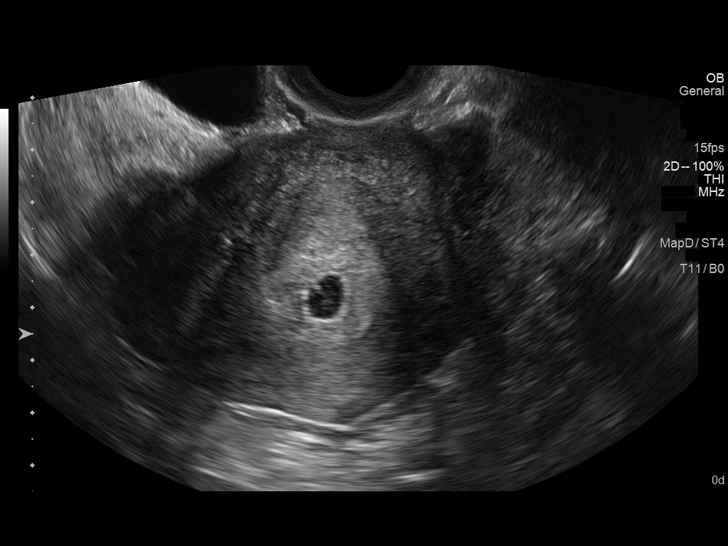
[im 23/30]
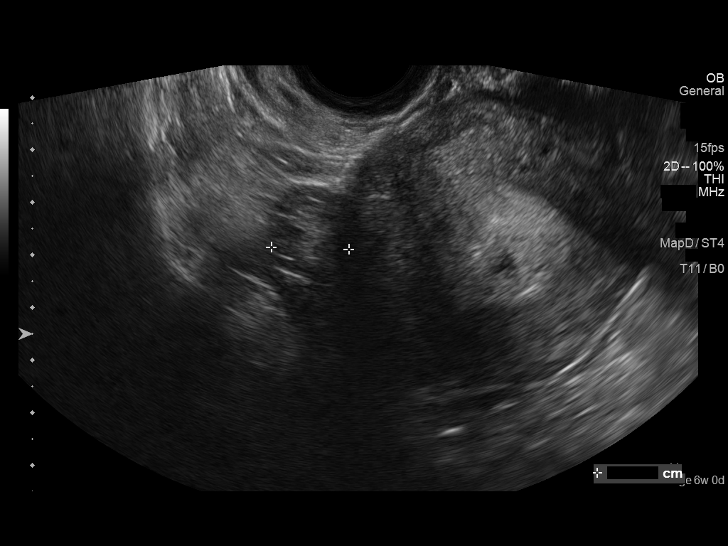
[im 25/30]
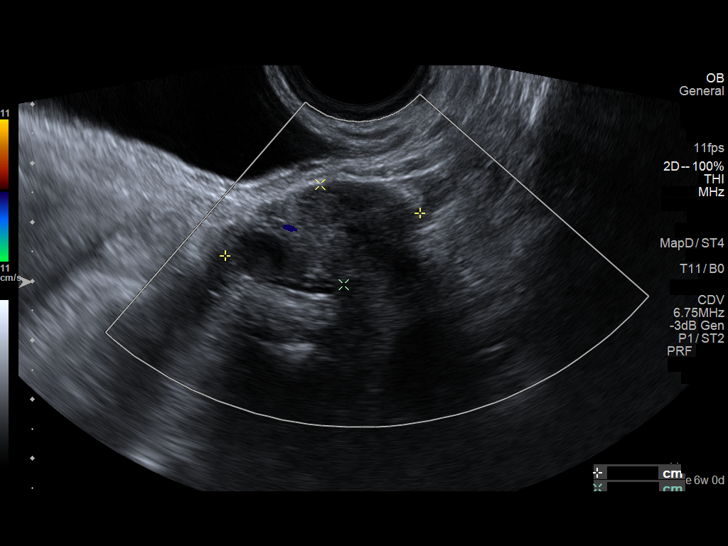
[im 27/30]
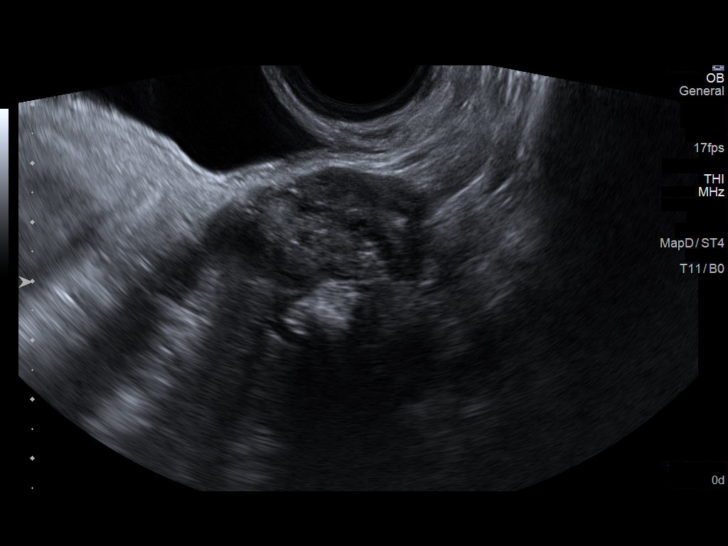
[im 30/30]
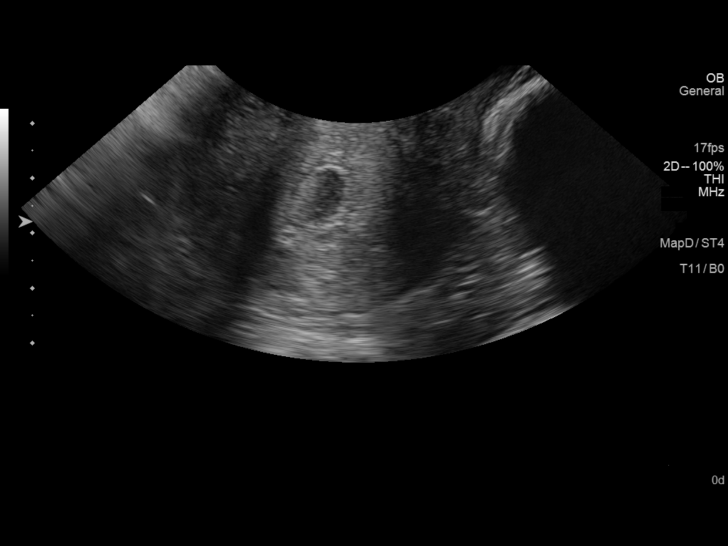

[14 of 28 positions shown; findings below may reference images not displayed]

FINDINGS: Intrauterine gestational sac: Visualized/normal in shape.

Yolk sac:  Visualized

Embryo:  Visualized

Cardiac Activity: Visualized

Heart Rate:  96-100 bpm

MSD:    mm    w     d

CRL:   3.7  mm   6 w 0 d                  US EDC: 07/26/2015

Maternal uterus/adnexae: Small subchorionic hemorrhage. No adnexal
masses or free fluid.
IMPRESSION: Six week intrauterine pregnancy. Fetal heart rate 96 to 100 beats
per min. Small subchorionic hemorrhage.

## 2015-10-30 IMAGING — US US OB TRANSVAGINAL
1 series · 14 of 23 positions shown · non-contrast
Comparison: Pelvic ultrasound performed 12/01/2014

CLINICAL DATA: Acute onset of vaginal bleeding.  Initial encounter.

EXAM:
TRANSVAGINAL OB ULTRASOUND
TECHNIQUE: Transvaginal ultrasound was performed for complete evaluation of the
gestation as well as the maternal uterus, adnexal regions, and
pelvic cul-de-sac.

[Series 1: us ob transvaginal · 14 of 23 slices shown]
[im 1/23]
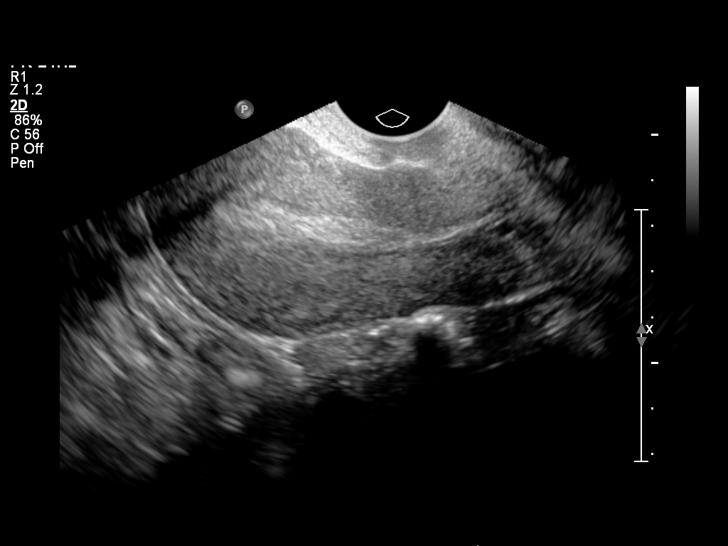
[im 3/23]
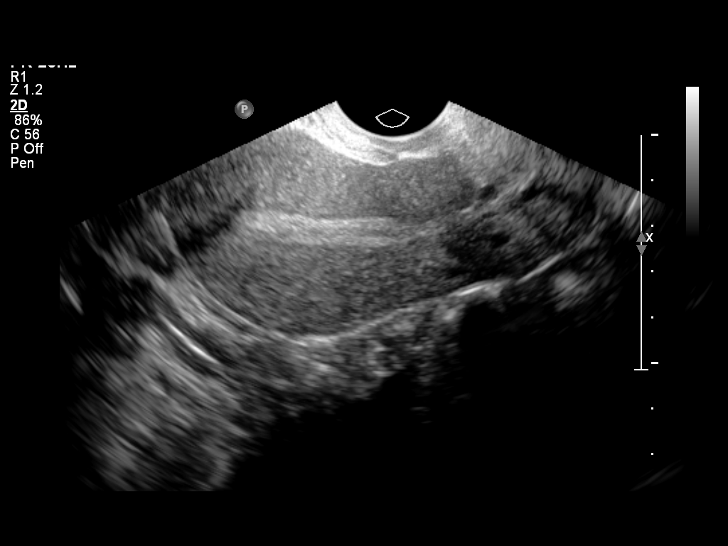
[im 5/23]
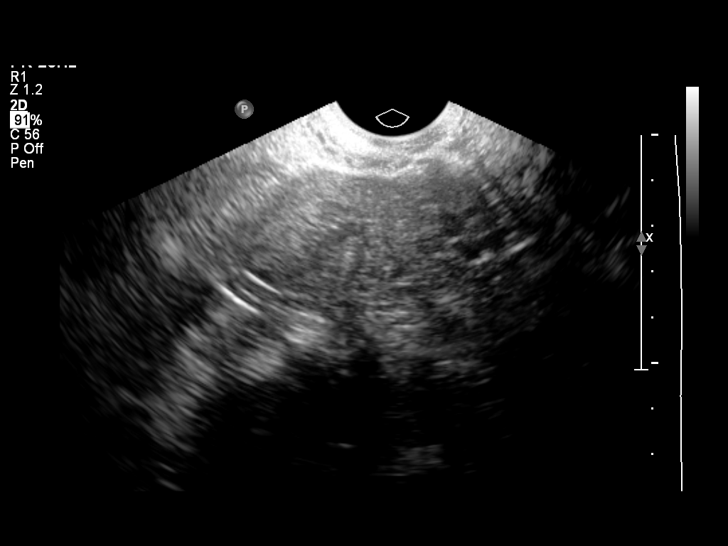
[im 6/23]
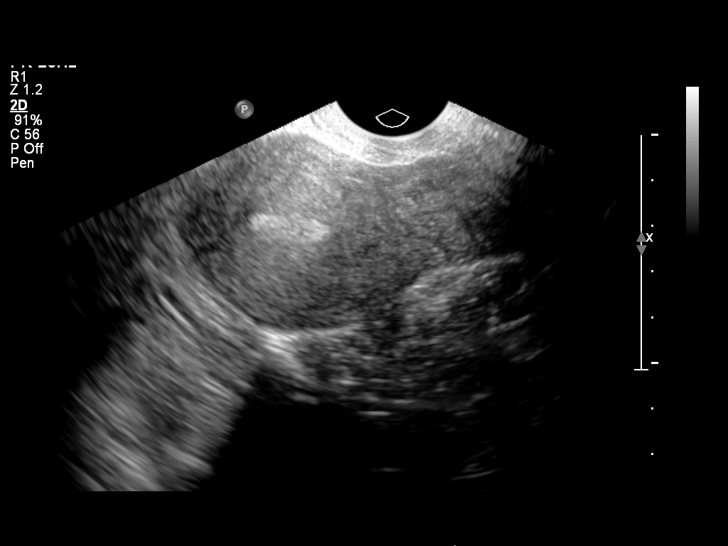
[im 8/23]
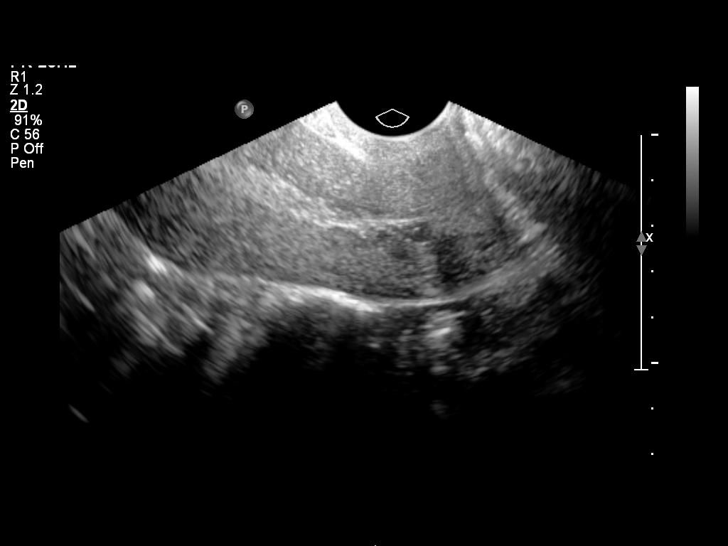
[im 10/23]
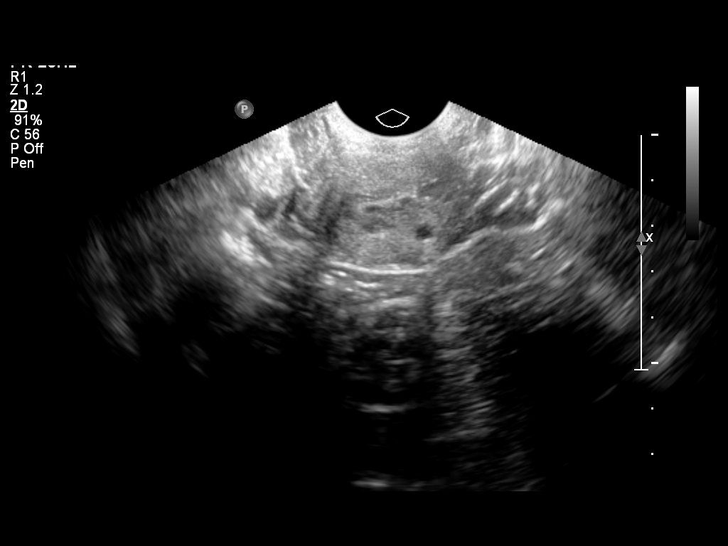
[im 11/23]
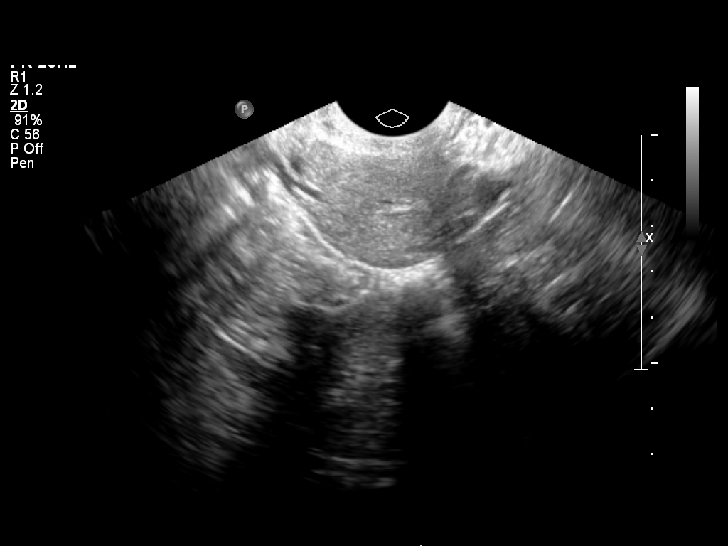
[im 13/23]
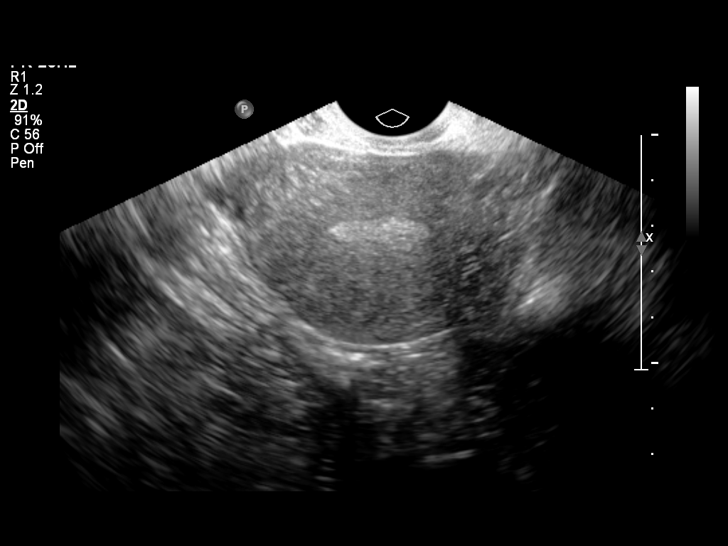
[im 14/23]
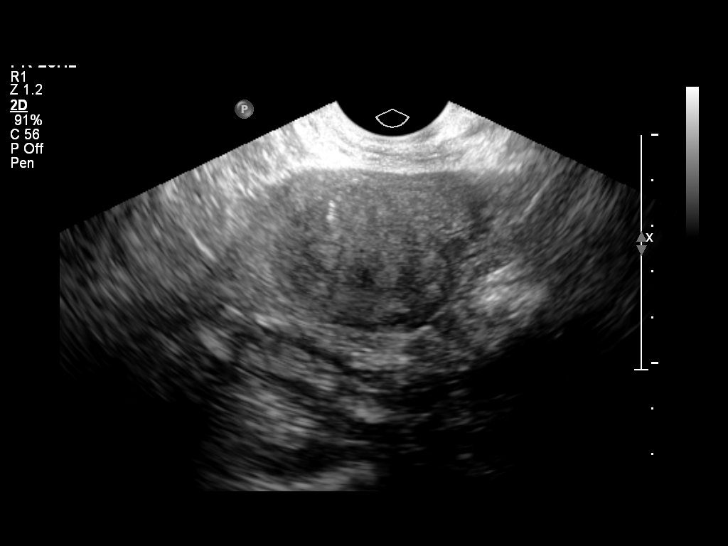
[im 16/23]
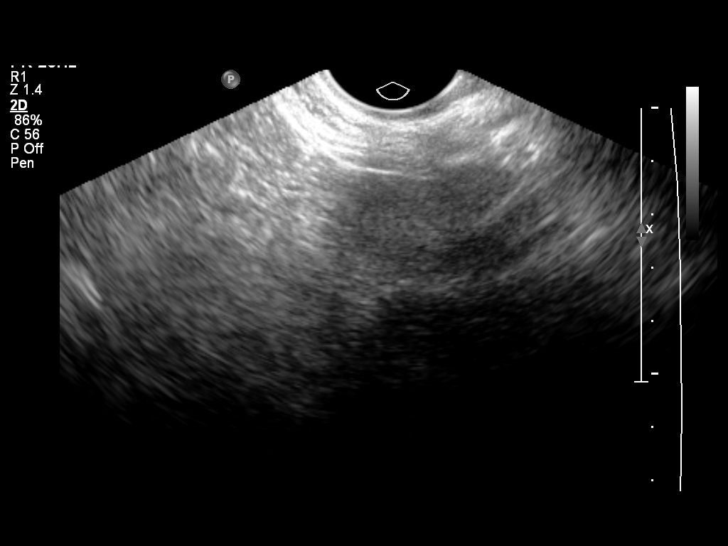
[im 18/23]
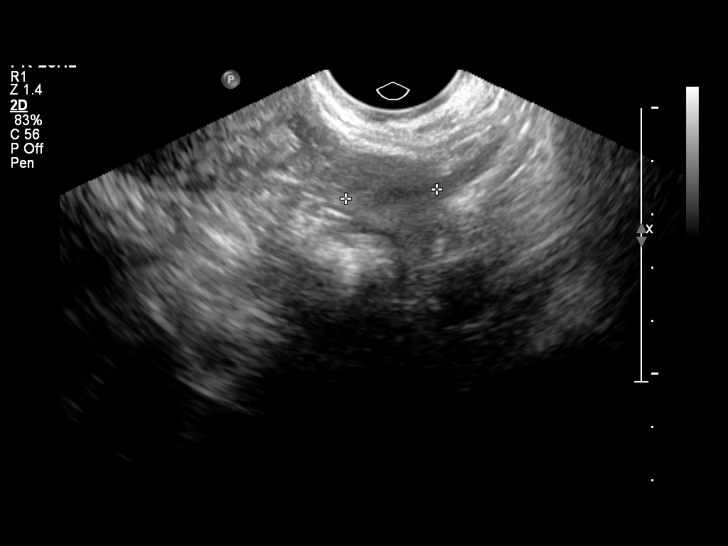
[im 19/23]
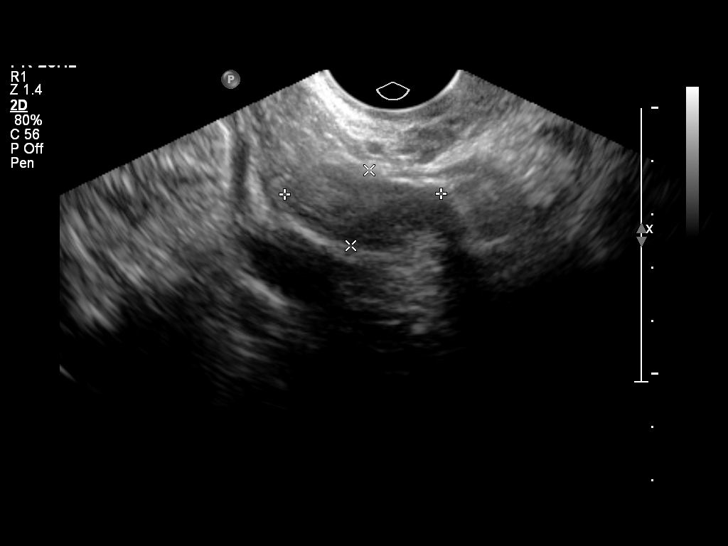
[im 21/23]
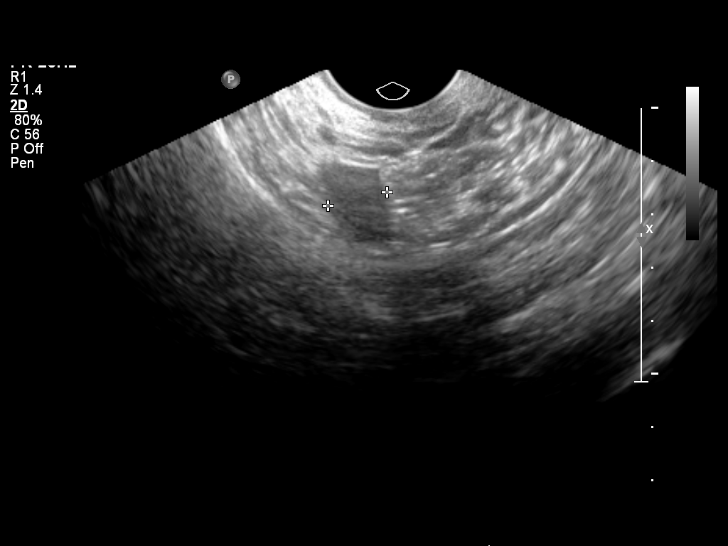
[im 23/23]
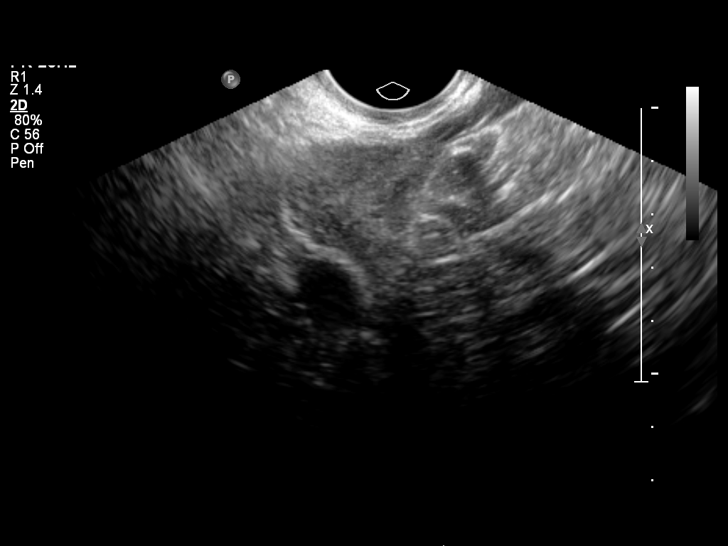

[14 of 23 positions shown; findings below may reference images not displayed]

FINDINGS: Intrauterine gestational sac: None seen.

Yolk sac:  N/A

Embryo:  N/A

Maternal uterus/adnexae: The uterus is unremarkable in appearance.

The ovaries are within normal limits. The right ovary measures 2.9 x
1.5 x 1.1 cm, while the left ovary measures 3.1 x 1.8 x 1.7 cm. No
suspicious adnexal masses are seen; there is no evidence for ovarian
torsion.

No free fluid is seen within the pelvic cul-de-sac.
IMPRESSION: No intrauterine gestational sac is now seen. This is compatible with
missed spontaneous abortion.

## 2017-08-10 ENCOUNTER — Emergency Department (HOSPITAL_BASED_OUTPATIENT_CLINIC_OR_DEPARTMENT_OTHER)
Admission: EM | Admit: 2017-08-10 | Discharge: 2017-08-10 | Disposition: A | Discharge status: Home / Self Care | Payer: 59 | Attending: Emergency Medicine | Admitting: Emergency Medicine

## 2017-08-10 ENCOUNTER — Encounter (HOSPITAL_BASED_OUTPATIENT_CLINIC_OR_DEPARTMENT_OTHER): Payer: Self-pay | Admitting: *Deleted

## 2017-08-10 DIAGNOSIS — S39012A Strain of muscle, fascia and tendon of lower back, initial encounter: Secondary | ICD-10-CM | POA: Diagnosis not present

## 2017-08-10 DIAGNOSIS — Y929 Unspecified place or not applicable: Secondary | ICD-10-CM | POA: Insufficient documentation

## 2017-08-10 DIAGNOSIS — X58XXXA Exposure to other specified factors, initial encounter: Secondary | ICD-10-CM | POA: Diagnosis not present

## 2017-08-10 DIAGNOSIS — Z79899 Other long term (current) drug therapy: Secondary | ICD-10-CM | POA: Insufficient documentation

## 2017-08-10 DIAGNOSIS — Y939 Activity, unspecified: Secondary | ICD-10-CM | POA: Insufficient documentation

## 2017-08-10 DIAGNOSIS — R1032 Left lower quadrant pain: Secondary | ICD-10-CM | POA: Diagnosis present

## 2017-08-10 DIAGNOSIS — Y999 Unspecified external cause status: Secondary | ICD-10-CM | POA: Insufficient documentation

## 2017-08-10 DIAGNOSIS — M545 Low back pain, unspecified: Secondary | ICD-10-CM

## 2017-08-10 DIAGNOSIS — Z7982 Long term (current) use of aspirin: Secondary | ICD-10-CM | POA: Diagnosis not present

## 2017-08-10 HISTORY — DX: Other chronic pain: G89.29

## 2017-08-10 LAB — URINALYSIS, ROUTINE W REFLEX MICROSCOPIC
BILIRUBIN URINE: NEGATIVE
GLUCOSE, UA: NEGATIVE mg/dL
KETONES UR: NEGATIVE mg/dL
NITRITE: NEGATIVE
PH: 6.5 (ref 5.0–8.0)
Protein, ur: NEGATIVE mg/dL
Specific Gravity, Urine: 1.02 (ref 1.005–1.030)

## 2017-08-10 LAB — URINALYSIS, MICROSCOPIC (REFLEX)

## 2017-08-10 LAB — PREGNANCY, URINE: Preg Test, Ur: NEGATIVE

## 2017-08-10 MED ORDER — DEXAMETHASONE SODIUM PHOSPHATE 10 MG/ML IJ SOLN
10.0000 mg | Freq: Once | INTRAMUSCULAR | Status: AC
  Administered 2017-08-10: 10 mg via INTRAMUSCULAR
  Filled 2017-08-10: qty 1

## 2017-08-10 MED ORDER — LIDOCAINE 5 % EX PTCH
1.0000 | MEDICATED_PATCH | CUTANEOUS | Status: DC
  Filled 2017-08-10: qty 1

## 2017-08-10 MED ORDER — LIDOCAINE 5 % EX PTCH: 1.0000 | MEDICATED_PATCH | CUTANEOUS | 0 refills | Status: DC

## 2017-08-10 NOTE — Discharge Instructions (Signed)
Please read and follow all provided instructions.  Your diagnoses today include:  1. Acute left-sided low back pain without sciatica   2. Strain of lumbar region, initial encounter     Tests performed today include: Vital signs - see below for your results today  Medications prescribed:   Take any prescribed medications only as directed.  Home care instructions:  Follow any educational materials contained in this packet Please rest, use ice or heat on your back for the next several days Do not lift, push, pull anything more than 10 pounds for the next week  Follow-up instructions: Please follow-up with your primary care provider in the next 1 week for further evaluation of your symptoms.   Return instructions:  SEEK IMMEDIATE MEDICAL ATTENTION IF YOU HAVE: New numbness, tingling, weakness, or problem with the use of your arms or legs Severe back pain not relieved with medications Loss control of your bowels or bladder Increasing pain in any areas of the body (such as chest or abdominal pain) Shortness of breath, dizziness, or fainting.  Worsening nausea (feeling sick to your stomach), vomiting, fever, or sweats Any other emergent concerns regarding your health   Additional Information:  Your vital signs today were: BP 130/86 (BP Location: Left Arm)    Pulse 88    Temp 98.4 F (36.9 C) (Oral)    Resp 20    Ht 5\' 9"  (1.753 m)    Wt 106.6 kg (235 lb)    LMP 08/02/2017 (Approximate)    SpO2 100%    BMI 34.70 kg/m  If your blood pressure (BP) was elevated above 135/85 this visit, please have this repeated by your doctor within one month. --------------

## 2017-08-10 NOTE — ED Triage Notes (Signed)
Reports flare-up of chronic back (L side) since late August. Has been evaluated x2 for same and received Vicodin -- states no effect. Denies known injury/recent fall.

## 2017-08-10 NOTE — ED Provider Notes (Signed)
[facility] Provider Note   CSN: [id number] Arrival date & time: 08/10/17  1555     History   Chief Complaint Chief Complaint  Patient presents with  . Back Pain    HPI Tiffany Reid is a 41 y.o. female.  HPI  41 y.o. female, presents to the Emergency Department today due to "flare up" of chronic low back pain on left side. This occurred x 2 weeks ago. Notes chronic back pain since August. Pt has been evaluated x 2 for same. Noted visits on 08-01-17, 07-29-17. Pt states that the pain is in the lower part of her back and radiates down to her knees. Pt has been taking narcotic pain medication that was given to her and notes the feeling of the medication working, but does not help with the pain. Pt states that it is difficult to stand up and hurts to sit, turn, and walk. Notes decrease in pain when not moving. States pain 6/10. Cramping sensation. No loss of bowel or bladder function. No saddle anesthesia. No fevers. No headaches. No CP/SOB/ABD pain. No other symptoms noted.     Past Medical History:  Diagnosis Date  . Anemia   . Chronic pain   . Pregnancy     There are no active problems to display for this patient.   History reviewed. No pertinent surgical history.  OB History    Gravida Para Term Preterm AB Living   5 2 2   2      SAB TAB Ectopic Multiple Live Births   2               Home Medications    Prior to Admission medications   Medication Sig Start Date End Date Taking? Authorizing Provider  GABAPENTIN PO Take by mouth.   Yes [provider]  HYDROcodone-acetaminophen (NORCO/VICODIN) 5-325 MG tablet Take 1 tablet by mouth every 6 (six) hours as needed for moderate pain.   Yes [provider]  UNKNOWN TO PATIENT    Yes [provider]  UNKNOWN TO PATIENT    Yes [provider]  ValACYclovir HCl (VALTREX PO) Take by mouth.   Yes [provider]  aspirin 81 MG chewable tablet Chew 324 mg by mouth  once.    [provider]  cephALEXin (KEFLEX) 500 MG capsule Take 1 capsule (500 mg total) by mouth 2 (two) times daily. 12/01/14   Ward, Layla MawKristen N, DO  meloxicam (MOBIC) 7.5 MG tablet Take 2 tablets (15 mg total) by mouth daily. 03/06/15   Piepenbrink, Victorino DikeJennifer, PA-C  nitroGLYCERIN (NITROSTAT) 0.4 MG SL tablet Place 0.4 mg under the tongue once.    [provider]    Family History No family history on file.  Social History Social History  Substance Use Topics  . Smoking status: Never Smoker  . Smokeless tobacco: Never Used  . Alcohol use Yes     Comment: socially     Allergies   Penicillins   Review of Systems Review of Systems ROS reviewed and all are negative for acute change except as noted in the HPI.  Physical Exam Updated Vital Signs BP 130/86 (BP Location: Left Arm)   Pulse 88   Temp 98.4 F (36.9 C) (Oral)   Resp 20   Ht 5\' 9"  (1.753 m)   Wt 106.6 kg (235 lb)   LMP 08/02/2017 (Approximate)   SpO2 100%   BMI 34.70 kg/m   Physical Exam  Constitutional: She is oriented to person,  place, and time. Vital signs are normal. She appears well-developed and well-nourished.  HENT:  Head: Normocephalic and atraumatic.  Right Ear: Hearing normal.  Left Ear: Hearing normal.  Eyes: Pupils are equal, round, and reactive to light. Conjunctivae and EOM are normal.  Neck: Normal range of motion. Neck supple.  Cardiovascular: Normal rate, regular rhythm, normal heart sounds and intact distal pulses.   Pulmonary/Chest: Effort normal and breath sounds normal.  Musculoskeletal: Normal range of motion.  TTP left lower lumbar musculature. No midline tenderness. No palpable or visible deformities.   Neurological: She is alert and oriented to person, place, and time. She has normal strength. No sensory deficit.  BLE NVI. Motor/sensatory exam unremarkable. Strength equal bilaterally. Able to ambulate  Skin: Skin is warm and dry.  Psychiatric: She has a normal  mood and affect. Her speech is normal and behavior is normal. Thought content normal.  Nursing note and vitals reviewed.    ED Treatments / Results  Labs (all labs ordered are listed, but only abnormal results are displayed) Labs Reviewed  URINALYSIS, ROUTINE W REFLEX MICROSCOPIC  PREGNANCY, URINE    EKG  EKG Interpretation None       Radiology No results found.  Procedures Procedures (including critical care time)  Medications Ordered in ED Medications - No data to display   Initial Impression / Assessment and Plan / ED Course  I have reviewed the triage vital signs and the nursing notes.  Pertinent labs & imaging results that were available during my care of the patient were reviewed by me and considered in my medical decision making (see chart for details).  Final Clinical Impressions(s) / ED Diagnoses     {I have reviewed the relevant previous healthcare records.  {I obtained HPI from historian.   ED Course:  Assessment: Patient is a 41 y.o. female presents to the Emergency Department today due to "flare up" of chronic low back pain on left side. This occurred x 2 weeks ago. Notes chronic back pain since August. Pt has been evaluated x 2 for same. Noted visits on 08-01-17, 07-29-17. Pt states that the pain is in the lower part of her back and radiates down to her knees. Pt has been taking narcotic pain medication that was given to her and notes the feeling of the medication working, but does not help with the pain. Pt states that it is difficult to stand up and hurts to sit, turn, and walk. Notes decrease in pain when not moving. States pain 6/10. Cramping sensation. No loss of bowel or bladder function. No saddle anesthesia. No fevers. No headaches. No CP/SOB/ABD pain.No neurological deficits appreciated. Patient is ambulatory. No warning symptoms of back pain including: fecal incontinence, urinary retention or overflow incontinence, night sweats, waking from sleep with  back pain, unexplained fevers or weight loss, h/o cancer, IVDU, recent trauma. No concern for cauda equina, epidural abscess, or other serious cause of back pain. UA obtained and contaminated. Pt without symptoms of dysuria or abdominal pain. Conservative measures such as rest, ice/heat and pain medicine indicated with PCP follow-up if no improvement with conservative management.  Disposition/Plan:  DC Home Additional Verbal discharge instructions given and discussed with patient.  Pt Instructed to f/u with PCP in the next week for evaluation and treatment of symptoms. Return precautions given Pt acknowledges and agrees with plan  Supervising Physician Arby Barrette, MD  Final diagnoses:  Acute left-sided low back pain without sciatica  Strain of lumbar region, initial encounter  New Prescriptions New Prescriptions   No medications on file       Audry Pili, Cordelia Poche 08/10/17 1803    Arby Barrette, MD 08/20/17 1058

## 2017-09-17 DIAGNOSIS — A6000 Herpesviral infection of urogenital system, unspecified: Secondary | ICD-10-CM | POA: Insufficient documentation

## 2017-09-17 DIAGNOSIS — K219 Gastro-esophageal reflux disease without esophagitis: Secondary | ICD-10-CM

## 2017-09-17 HISTORY — DX: Gastro-esophageal reflux disease without esophagitis: K21.9

## 2018-07-29 DIAGNOSIS — O09529 Supervision of elderly multigravida, unspecified trimester: Secondary | ICD-10-CM | POA: Insufficient documentation

## 2018-07-30 ENCOUNTER — Encounter: Payer: Medicaid Other | Admitting: Obstetrics and Gynecology

## 2018-09-08 ENCOUNTER — Other Ambulatory Visit (HOSPITAL_COMMUNITY)
Admission: RE | Admit: 2018-09-08 | Discharge: 2018-09-08 | Disposition: A | Discharge status: Home / Self Care | Payer: Medicaid Other | Source: Ambulatory Visit | Attending: Obstetrics | Admitting: Obstetrics

## 2018-09-08 ENCOUNTER — Ambulatory Visit (INDEPENDENT_AMBULATORY_CARE_PROVIDER_SITE_OTHER): Payer: Medicaid Other | Admitting: Obstetrics

## 2018-09-08 ENCOUNTER — Encounter: Payer: Self-pay | Admitting: Obstetrics

## 2018-09-08 VITALS — BP 121/78 | HR 102 | Wt 250.8 lb

## 2018-09-08 DIAGNOSIS — O09522 Supervision of elderly multigravida, second trimester: Secondary | ICD-10-CM | POA: Insufficient documentation

## 2018-09-08 DIAGNOSIS — O09529 Supervision of elderly multigravida, unspecified trimester: Secondary | ICD-10-CM

## 2018-09-08 NOTE — Progress Notes (Signed)
Patient is in the office for initial ob visit, planned pregnancy, living with FOB. Pt reports feeling fetal flutter movement with occasional ligament pain.

## 2018-09-08 NOTE — Progress Notes (Signed)
Subjective:  Tiffany Reid is a 42 y.o. Z6X0960 at [redacted]w[redacted]d being seen today for ongoing prenatal care.  She is currently monitored for the following issues for this high-risk pregnancy and has Encounter for supervision of high-risk pregnancy with multigravida of advanced maternal age on their problem list.  Patient reports no complaints.  Contractions: Not present. Vag. Bleeding: None.  Movement: Present. Denies leaking of fluid.   The following portions of the patient's history were reviewed and updated as appropriate: allergies, current medications, past family history, past medical history, past social history, past surgical history and problem list. Problem list updated.  Objective:   Vitals:   09/08/18 1456  BP: 121/78  Pulse: (!) 102  Weight: 250 lb 12.8 oz (113.8 kg)    Fetal Status: Fetal Heart Rate (bpm): 140   Movement: Present     General:  Alert, oriented and cooperative. Patient is in no acute distress.  Skin: Skin is warm and dry. No rash noted.   Cardiovascular: Normal heart rate noted  Respiratory: Normal respiratory effort, no problems with respiration noted  Abdomen: Soft, gravid, appropriate for gestational age. Pain/Pressure: Present     Pelvic:  Cervical exam deferred        Extremities: Normal range of motion.  Edema: Trace  Mental Status: Normal mood and affect. Normal behavior. Normal judgment and thought content.   Urinalysis:      Assessment and Plan:  Pregnancy: [id number] at [redacted]w[redacted]d  1. Encounter for supervision of high-risk pregnancy with multigravida of advanced maternal age Rx: - AFP, Serum, Open Spina Bifida - Culture, OB Urine - Genetic Screening - AMB MFM GENETICS REFERRAL - [place] MFM OB DETAIL +14 WK; Future - Cytology - PAP   Preterm labor symptoms and general obstetric precautions including but not limited to vaginal bleeding, contractions, leaking of fluid and fetal movement were reviewed in detail with the patient. Please refer to After Visit  Summary for other counseling recommendations.  Return in about 4 weeks (around 10/06/2018) for ROB.   Brock Bad, MD

## 2018-09-09 ENCOUNTER — Encounter: Payer: Self-pay | Admitting: Obstetrics

## 2018-09-10 LAB — AFP, SERUM, OPEN SPINA BIFIDA
AFP MoM: 0.86
AFP Value: 27.3 ng/mL
Gest. Age on Collection Date: 17.3 weeks
MATERNAL AGE AT EDD: 41 a
OSBR Risk 1 IN: 10000
Test Results:: NEGATIVE
Weight: 251 [lb_av]

## 2018-09-10 LAB — URINE CULTURE, OB REFLEX

## 2018-09-10 LAB — CULTURE, OB URINE

## 2018-09-11 ENCOUNTER — Encounter (HOSPITAL_COMMUNITY): Payer: Self-pay

## 2018-09-11 LAB — CYTOLOGY - PAP
Diagnosis: NEGATIVE
HPV: NOT DETECTED

## 2018-09-16 ENCOUNTER — Encounter (HOSPITAL_COMMUNITY): Payer: Self-pay

## 2018-09-18 ENCOUNTER — Encounter (HOSPITAL_COMMUNITY): Payer: Self-pay

## 2018-09-18 ENCOUNTER — Ambulatory Visit (HOSPITAL_COMMUNITY)
Admission: RE | Admit: 2018-09-18 | Discharge: 2018-09-18 | Disposition: A | Discharge status: Home / Self Care | Payer: Medicaid Other | Source: Ambulatory Visit | Attending: Obstetrics | Admitting: Obstetrics

## 2018-09-18 DIAGNOSIS — O09529 Supervision of elderly multigravida, unspecified trimester: Secondary | ICD-10-CM | POA: Diagnosis present

## 2018-09-18 DIAGNOSIS — Z363 Encounter for antenatal screening for malformations: Secondary | ICD-10-CM | POA: Diagnosis not present

## 2018-09-18 DIAGNOSIS — O09522 Supervision of elderly multigravida, second trimester: Secondary | ICD-10-CM | POA: Diagnosis not present

## 2018-09-18 DIAGNOSIS — O99213 Obesity complicating pregnancy, third trimester: Secondary | ICD-10-CM

## 2018-09-18 DIAGNOSIS — Z3A18 18 weeks gestation of pregnancy: Secondary | ICD-10-CM | POA: Diagnosis not present

## 2018-09-18 HISTORY — DX: Polyneuropathy, unspecified: G62.9

## 2018-09-19 ENCOUNTER — Other Ambulatory Visit (HOSPITAL_COMMUNITY): Payer: Self-pay | Admitting: *Deleted

## 2018-09-19 DIAGNOSIS — O09522 Supervision of elderly multigravida, second trimester: Secondary | ICD-10-CM

## 2018-10-06 ENCOUNTER — Encounter: Payer: Self-pay | Admitting: Obstetrics

## 2018-10-06 ENCOUNTER — Ambulatory Visit (INDEPENDENT_AMBULATORY_CARE_PROVIDER_SITE_OTHER): Payer: Medicaid Other | Admitting: Obstetrics

## 2018-10-06 VITALS — BP 127/79 | HR 98 | Wt 251.2 lb

## 2018-10-06 DIAGNOSIS — O98312 Other infections with a predominantly sexual mode of transmission complicating pregnancy, second trimester: Secondary | ICD-10-CM

## 2018-10-06 DIAGNOSIS — O98319 Other infections with a predominantly sexual mode of transmission complicating pregnancy, unspecified trimester: Secondary | ICD-10-CM

## 2018-10-06 DIAGNOSIS — O09522 Supervision of elderly multigravida, second trimester: Secondary | ICD-10-CM

## 2018-10-06 DIAGNOSIS — O09529 Supervision of elderly multigravida, unspecified trimester: Secondary | ICD-10-CM

## 2018-10-06 DIAGNOSIS — A6009 Herpesviral infection of other urogenital tract: Secondary | ICD-10-CM

## 2018-10-06 MED ORDER — VALACYCLOVIR HCL 500 MG PO TABS: 500.0000 mg | ORAL_TABLET | Freq: Every day | ORAL | 11 refills | Status: DC

## 2018-10-06 NOTE — Progress Notes (Signed)
Subjective:  Tiffany Reid is a 42 y.o. Z6X0960 at [redacted]w[redacted]d being seen today for ongoing prenatal care.  She is currently monitored for the following issues for this high-risk pregnancy and has Encounter for supervision of high-risk pregnancy with multigravida of advanced maternal age on their problem list.  Patient reports no complaints.  Contractions: Not present. Vag. Bleeding: None.  Movement: Present. Denies leaking of fluid.   The following portions of the patient's history were reviewed and updated as appropriate: allergies, current medications, past family history, past medical history, past social history, past surgical history and problem list. Problem list updated.  Objective:   Vitals:   10/06/18 1438  BP: 127/79  Pulse: 98  Weight: 251 lb 3.2 oz (113.9 kg)    Fetal Status:     Movement: Present     General:  Alert, oriented and cooperative. Patient is in no acute distress.  Skin: Skin is warm and dry. No rash noted.   Cardiovascular: Normal heart rate noted  Respiratory: Normal respiratory effort, no problems with respiration noted  Abdomen: Soft, gravid, appropriate for gestational age. Pain/Pressure: Present     Pelvic:  Cervical exam deferred        Extremities: Normal range of motion.  Edema: None  Mental Status: Normal mood and affect. Normal behavior. Normal judgment and thought content.   Urinalysis:      Assessment and Plan:  Pregnancy: [id number] at [redacted]w[redacted]d  1. Encounter for supervision of high-risk pregnancy with multigravida of advanced maternal age  43. Genital herpes affecting pregnancy, antepartum Rx: - valACYclovir (VALTREX) 500 MG tablet; Take 1 tablet (500 mg total) by mouth daily.  Dispense: 30 tablet; Refill: 11  Preterm labor symptoms and general obstetric precautions including but not limited to vaginal bleeding, contractions, leaking of fluid and fetal movement were reviewed in detail with the patient. Please refer to After Visit Summary for other  counseling recommendations.  Return in about 4 weeks (around 11/03/2018) for ROB.   Brock Bad, MD

## 2018-10-16 ENCOUNTER — Ambulatory Visit (HOSPITAL_COMMUNITY)
Admission: RE | Admit: 2018-10-16 | Discharge: 2018-10-16 | Disposition: A | Discharge status: Home / Self Care | Payer: Medicaid Other | Source: Ambulatory Visit | Attending: Obstetrics | Admitting: Obstetrics

## 2018-10-16 ENCOUNTER — Encounter (HOSPITAL_COMMUNITY): Payer: Self-pay

## 2018-10-16 DIAGNOSIS — O09522 Supervision of elderly multigravida, second trimester: Secondary | ICD-10-CM | POA: Diagnosis not present

## 2018-10-16 DIAGNOSIS — O99213 Obesity complicating pregnancy, third trimester: Secondary | ICD-10-CM | POA: Diagnosis not present

## 2018-10-16 DIAGNOSIS — Z362 Encounter for other antenatal screening follow-up: Secondary | ICD-10-CM | POA: Diagnosis not present

## 2018-10-16 DIAGNOSIS — Z3A22 22 weeks gestation of pregnancy: Secondary | ICD-10-CM | POA: Insufficient documentation

## 2018-10-17 ENCOUNTER — Other Ambulatory Visit (HOSPITAL_COMMUNITY): Payer: Self-pay | Admitting: *Deleted

## 2018-10-17 DIAGNOSIS — O99213 Obesity complicating pregnancy, third trimester: Secondary | ICD-10-CM

## 2018-11-03 ENCOUNTER — Encounter: Payer: Self-pay | Admitting: Obstetrics

## 2018-11-03 ENCOUNTER — Ambulatory Visit (INDEPENDENT_AMBULATORY_CARE_PROVIDER_SITE_OTHER): Payer: Medicaid Other | Admitting: Obstetrics

## 2018-11-03 VITALS — BP 104/67 | HR 87 | Wt 252.0 lb

## 2018-11-03 DIAGNOSIS — O98319 Other infections with a predominantly sexual mode of transmission complicating pregnancy, unspecified trimester: Secondary | ICD-10-CM

## 2018-11-03 DIAGNOSIS — Z23 Encounter for immunization: Secondary | ICD-10-CM

## 2018-11-03 DIAGNOSIS — O09522 Supervision of elderly multigravida, second trimester: Secondary | ICD-10-CM

## 2018-11-03 DIAGNOSIS — O98312 Other infections with a predominantly sexual mode of transmission complicating pregnancy, second trimester: Secondary | ICD-10-CM

## 2018-11-03 DIAGNOSIS — A6009 Herpesviral infection of other urogenital tract: Secondary | ICD-10-CM

## 2018-11-03 DIAGNOSIS — O09529 Supervision of elderly multigravida, unspecified trimester: Secondary | ICD-10-CM

## 2018-11-03 NOTE — Progress Notes (Signed)
Subjective:  Tiffany Reid is a 42 y.o. [id number] at 2133w3d being seen today for ongoing prenatal care.  She is currently monitored for the following issues for this high-risk pregnancy and has Encounter for supervision of high-risk pregnancy with multigravida of advanced maternal age on their problem list.  Patient reports no complaints.  Contractions: Not present. Vag. Bleeding: None.  Movement: Present. Denies leaking of fluid.   The following portions of the patient's history were reviewed and updated as appropriate: allergies, current medications, past family history, past medical history, past social history, past surgical history and problem list. Problem list updated.  Objective:   Vitals:   11/03/18 1005  BP: 104/67  Pulse: 87  Weight: 252 lb (114.3 kg)    Fetal Status: Fetal Heart Rate (bpm): 150   Movement: Present     General:  Alert, oriented and cooperative. Patient is in no acute distress.  Skin: Skin is warm and dry. No rash noted.   Cardiovascular: Normal heart rate noted  Respiratory: Normal respiratory effort, no problems with respiration noted  Abdomen: Soft, gravid, appropriate for gestational age. Pain/Pressure: Present     Pelvic:  Cervical exam deferred        Extremities: Normal range of motion.     Mental Status: Normal mood and affect. Normal behavior. Normal judgment and thought content.   Urinalysis:      Assessment and Plan:  Pregnancy: [id number] at 3533w3d  1. Encounter for supervision of high-risk pregnancy with multigravida of advanced maternal age Rx: - Flu Vaccine QUAD 36+ mos IM (Fluarix, Quad PF)  2. Genital herpes affecting pregnancy, antepartum   Preterm labor symptoms and general obstetric precautions including but not limited to vaginal bleeding, contractions, leaking of fluid and fetal movement were reviewed in detail with the patient. Please refer to After Visit Summary for other counseling recommendations.  Return in about 3 weeks (around  11/24/2018) for ROB, 2 hour OGTT.   Brock BadHarper, Kaulana Brindle A, MD

## 2018-11-21 ENCOUNTER — Ambulatory Visit (INDEPENDENT_AMBULATORY_CARE_PROVIDER_SITE_OTHER): Payer: Medicaid Other | Admitting: Obstetrics

## 2018-11-21 ENCOUNTER — Other Ambulatory Visit: Payer: Self-pay

## 2018-11-21 ENCOUNTER — Other Ambulatory Visit: Payer: Medicaid Other

## 2018-11-21 ENCOUNTER — Encounter: Payer: Self-pay | Admitting: Obstetrics

## 2018-11-21 DIAGNOSIS — O09529 Supervision of elderly multigravida, unspecified trimester: Secondary | ICD-10-CM

## 2018-11-21 DIAGNOSIS — O09523 Supervision of elderly multigravida, third trimester: Secondary | ICD-10-CM

## 2018-11-21 NOTE — Progress Notes (Signed)
Subjective:  Tiffany Reid is a 42 y.o. Z6X0960G6P2032 at 4071w0d being seen today for ongoing prenatal care.  She is currently monitored for the following issues for this high-risk pregnancy and has Encounter for supervision of high-risk pregnancy with multigravida of advanced maternal age on their problem list.  Patient reports no complaints.   .  .   . Denies leaking of fluid.   The following portions of the patient's history were reviewed and updated as appropriate: allergies, current medications, past family history, past medical history, past social history, past surgical history and problem list. Problem list updated.  Objective:  There were no vitals filed for this visit.  Fetal Status:           General:  Alert, oriented and cooperative. Patient is in no acute distress.  Skin: Skin is warm and dry. No rash noted.   Cardiovascular: Normal heart rate noted  Respiratory: Normal respiratory effort, no problems with respiration noted  Abdomen: Soft, gravid, appropriate for gestational age.       Pelvic:  Cervical exam deferred        Extremities: Normal range of motion.     Mental Status: Normal mood and affect. Normal behavior. Normal judgment and thought content.   Urinalysis:      Assessment and Plan:  Pregnancy: [id number] at 571w0d  1. Encounter for supervision of high-risk pregnancy with multigravida of advanced maternal age Rx: - Glucose Tolerance, 2 Hours w/1 Hour - RPR - HIV antibody (with reflex) - CBC  Preterm labor symptoms and general obstetric precautions including but not limited to vaginal bleeding, contractions, leaking of fluid and fetal movement were reviewed in detail with the patient. Please refer to After Visit Summary for other counseling recommendations.  Return in about 2 weeks (around 12/05/2018) for ROB.   Brock BadHarper, Charles A, MD

## 2018-11-21 NOTE — Progress Notes (Signed)
ROB/GTT. TDAP declined. 

## 2018-11-22 ENCOUNTER — Other Ambulatory Visit: Payer: Self-pay | Admitting: Obstetrics

## 2018-11-22 DIAGNOSIS — D508 Other iron deficiency anemias: Secondary | ICD-10-CM

## 2018-11-22 LAB — CBC
Hematocrit: 30.1 % — ABNORMAL LOW (ref 34.0–46.6)
Hemoglobin: 10.4 g/dL — ABNORMAL LOW (ref 11.1–15.9)
MCH: 31 pg (ref 26.6–33.0)
MCHC: 34.6 g/dL (ref 31.5–35.7)
MCV: 90 fL (ref 79–97)
Platelets: 306 10*3/uL (ref 150–450)
RBC: 3.35 x10E6/uL — ABNORMAL LOW (ref 3.77–5.28)
RDW: 14 % (ref 12.3–15.4)
WBC: 11.8 10*3/uL — ABNORMAL HIGH (ref 3.4–10.8)

## 2018-11-22 LAB — RPR: RPR Ser Ql: NONREACTIVE

## 2018-11-22 LAB — GLUCOSE TOLERANCE, 2 HOURS W/ 1HR
GLUCOSE, 2 HOUR: 250 mg/dL — AB (ref 65–152)
GLUCOSE, FASTING: 119 mg/dL — AB (ref 65–91)
Glucose, 1 hour: 276 mg/dL — ABNORMAL HIGH (ref 65–179)

## 2018-11-22 LAB — HIV ANTIBODY (ROUTINE TESTING W REFLEX): HIV Screen 4th Generation wRfx: NONREACTIVE

## 2018-11-22 MED ORDER — FERROUS SULFATE 325 (65 FE) MG PO TABS: 325.0000 mg | ORAL_TABLET | Freq: Two times a day (BID) | ORAL | 5 refills | Status: DC

## 2018-11-24 ENCOUNTER — Other Ambulatory Visit: Payer: Self-pay

## 2018-11-24 DIAGNOSIS — O24419 Gestational diabetes mellitus in pregnancy, unspecified control: Secondary | ICD-10-CM

## 2018-11-24 MED ORDER — ACCU-CHEK FASTCLIX LANCETS MISC: 1.0000 | Freq: Four times a day (QID) | 12 refills | Status: DC

## 2018-11-24 MED ORDER — GLUCOSE BLOOD VI STRP: ORAL_STRIP | 12 refills | Status: DC

## 2018-11-24 MED ORDER — ACCU-CHEK GUIDE ME W/DEVICE KIT: 1.0000 | PACK | Freq: Four times a day (QID) | 0 refills | Status: DC

## 2018-11-27 ENCOUNTER — Other Ambulatory Visit: Payer: Self-pay

## 2018-11-27 DIAGNOSIS — O24419 Gestational diabetes mellitus in pregnancy, unspecified control: Secondary | ICD-10-CM

## 2018-11-27 MED ORDER — GLUCOSE BLOOD VI STRP: ORAL_STRIP | 12 refills | Status: DC

## 2018-12-03 NOTE — L&D Delivery Note (Signed)
Patient: Tiffany Reid MRN: [id number]  GBS status: Negative, IAP given: None   Patient is a 43 y.o. now G6P3 s/p NSVD at [redacted]w[redacted]d, who was admitted for IOL for severe Pre-E with history of A2GDM. SROM 0h 28m prior to delivery with clear fluid.    Delivery Note At 1:05 AM a non-viable female was delivered via Vaginal, Spontaneous (Presentation: LOA).  APGAR: 7, 9; weight 4440 g.   Placenta status: spontaneous,  intact.  Cord: 3 vessel with the following complications: none.  Cord pH: N/A  Anesthesia:   Episiotomy: None Lacerations: None Suture Repair: None Est. Blood Loss (mL):  567 cc   Poor maternal effort with pushing and mother moving up bed, closing legs. Head delivered LOA and retracted against the perineum. No nuchal cord present. Shoulder dystocia noted. Attempted McRobert's but poor maternal compliance. Subrapubic pressure applied. Woodscrew maneuver performed. Was able to hook left anterior axilla and deliver the shoulders. Body delivered in usual fashion. Total time of shoulder dystocia was 2 minutes. Dr. Debroah Loop and NICU present in the delivery room. Cord clamped x 2 immediately and infant taken to the warmer. Infant with spontaneous cry in the warmer and noted to be moving all 4 extremities. Cord blood drawn. Placenta delivered spontaneously with trailing membranes noted with gentle cord traction. Fundus firm with massage and Pitocin. Perineum inspected and found to have no lacerations.  Mom to postpartum.  Baby to Couplet care / Skin to Skin.  Continue Mg++ for 24h PP. Mother [facility].   De Hollingshead 01/25/2019, 1:22 AM

## 2018-12-04 ENCOUNTER — Encounter: Payer: Self-pay | Admitting: Skilled Nursing Facility1

## 2018-12-04 ENCOUNTER — Encounter: Payer: Medicaid Other | Attending: Obstetrics | Admitting: Skilled Nursing Facility1

## 2018-12-04 DIAGNOSIS — O24410 Gestational diabetes mellitus in pregnancy, diet controlled: Secondary | ICD-10-CM | POA: Insufficient documentation

## 2018-12-04 NOTE — Progress Notes (Signed)
Pt arrives stating this is her 6th pregnancies with 2 live births this being her first time with GDM. Pt states she is [redacted] weeks along. Pt states she checks her blood sugars 3 times a day fasting and 2 hours post prandial stating she is asleep before she can check it the 4th time.  Fasting numbers: 118, 116, 105 2 hours post prandial: 135, 155, 170 Pt states sometimes she gets really hot and sweaty: Dietitian advised pt to check her blood sugar during those times to ensure it is not blood sugar.  Pt goals: Walk 30 minutes 5 days a week Do not eat fruit with breakfast Drink 6 bottles of water daily Measure your food out with measuring cups  Diabetes Self-Management Education  Visit Type: First/Initial  12/04/2018  Tiffany Reid, identified by name and date of birth, is a 43 y.o. female with a diagnosis of Diabetes: Gestational Diabetes.   ASSESSMENT  Height 5\' 9"  (1.753 m), weight 252 lb 4.8 oz (114.4 kg), last menstrual period 05/09/2018, unknown if currently breastfeeding. Body mass index is 37.26 kg/m.  Diabetes Self-Management Education - 12/04/18 1105      Visit Information   Visit Type  First/Initial      Initial Visit   Diabetes Type  Gestational Diabetes    Are you currently following a meal plan?  No    Are you taking your medications as prescribed?  Not on Medications      Health Coping   How would you rate your overall health?  Fair      Psychosocial Assessment   Patient Belief/Attitude about Diabetes  Afraid      Pre-Education Assessment   Patient understands the diabetes disease and treatment process.  Needs Instruction    Patient understands incorporating nutritional management into lifestyle.  Needs Instruction    Patient undertands incorporating physical activity into lifestyle.  Needs Instruction    Patient understands using medications safely.  Needs Instruction    Patient understands monitoring blood glucose, interpreting and using results  Needs  Instruction    Patient understands prevention, detection, and treatment of acute complications.  Needs Instruction    Patient understands prevention, detection, and treatment of chronic complications.  Needs Instruction    Patient understands how to develop strategies to address psychosocial issues.  Needs Instruction    Patient understands how to develop strategies to promote health/change behavior.  Needs Instruction      Complications   How often do you check your blood sugar?  3-4 times/day    Fasting Blood glucose range (mg/dL)  34-917    Postprandial Blood glucose range (mg/dL)  915-056    Number of hyperglycemic episodes per week  1    Can you tell when your blood sugar is high?  No    Have you had a dilated eye exam in the past 12 months?  No    Have you had a dental exam in the past 12 months?  No    Are you checking your feet?  N/A      Dietary Intake   Breakfast  1 packet of oatmeal with 1 egg or peanut butter sandwich with apples somtimes sausgae biscuit and oatmeal     Lunch  graham crackers or salad or pizza    Snack (afternoon)  graham crackers or fruit or chips    Dinner  ribs and greens and black eyed peas    Snack (evening)  popcorn     Beverage(s)  water, 1% milk, sweet tea      Exercise   Exercise Type  ADL's;Light (walking / raking leaves)    How many days per week to you exercise?  2    How many minutes per day do you exercise?  15    Total minutes per week of exercise  30      Patient Education   Previous Diabetes Education  No    Disease state   Factors that contribute to the development of diabetes;Explored patient's options for treatment of their diabetes    Nutrition management   Role of diet in the treatment of diabetes and the relationship between the three main macronutrients and blood glucose level;Food label reading, portion sizes and measuring food.;Meal options for control of blood glucose level and chronic complications.;Information on hints to  eating out and maintain blood glucose control.;Meal timing in regards to the patients' current diabetes medication.;Carbohydrate counting;Reviewed blood glucose goals for pre and post meals and how to evaluate the patients' food intake on their blood glucose level.    Physical activity and exercise   Role of exercise on diabetes management, blood pressure control and cardiac health.;Identified with patient nutritional and/or medication changes necessary with exercise.    Monitoring  Taught/evaluated SMBG meter.;Purpose and frequency of SMBG.;Identified appropriate SMBG and/or A1C goals.    Acute complications  Taught treatment of hypoglycemia - the 15 rule.;Discussed and identified patients' treatment of hyperglycemia.    Chronic complications  Relationship between chronic complications and blood glucose control    Preconception care  Reviewed with patient blood glucose goals with pregnancy    Personal strategies to promote health  Lifestyle issues that need to be addressed for better diabetes care      Individualized Goals (developed by patient)   Physical Activity  Exercise 5-7 days per week;30 minutes per day    Monitoring   test my blood glucose as discussed;test blood glucose pre and post meals as discussed      Post-Education Assessment   Patient understands the diabetes disease and treatment process.  Demonstrates understanding / competency    Patient understands incorporating nutritional management into lifestyle.  Demonstrates understanding / competency    Patient undertands incorporating physical activity into lifestyle.  Demonstrates understanding / competency    Patient understands using medications safely.  Demonstrates understanding / competency    Patient understands monitoring blood glucose, interpreting and using results  Demonstrates understanding / competency    Patient understands prevention, detection, and treatment of acute complications.  Demonstrates understanding /  competency    Patient understands prevention, detection, and treatment of chronic complications.  Demonstrates understanding / competency    Patient understands how to develop strategies to address psychosocial issues.  Demonstrates understanding / competency    Patient understands how to develop strategies to promote health/change behavior.  Demonstrates understanding / competency      Outcomes   Expected Outcomes  Demonstrated interest in learning. Expect positive outcomes    Future DMSE  PRN    Program Status  Completed       Individualized Plan for Diabetes Self-Management Training:   Learning Objective:  Patient will have a greater understanding of diabetes self-management. Patient education plan is to attend individual and/or group sessions per assessed needs and concerns.   Plan:   There are no Patient Instructions on file for this visit.  Expected Outcomes:  Demonstrated interest in learning. Expect positive outcomes  Education material provided: ADA Diabetes: Your Take Control Guide  If problems or questions, patient to contact team via:  Phone  Future DSME appointment: PRN

## 2018-12-09 ENCOUNTER — Ambulatory Visit (INDEPENDENT_AMBULATORY_CARE_PROVIDER_SITE_OTHER): Payer: Medicaid Other | Admitting: Obstetrics

## 2018-12-09 ENCOUNTER — Encounter: Payer: Self-pay | Admitting: Obstetrics

## 2018-12-09 VITALS — BP 113/70 | HR 99 | Wt 251.2 lb

## 2018-12-09 DIAGNOSIS — O09529 Supervision of elderly multigravida, unspecified trimester: Secondary | ICD-10-CM

## 2018-12-09 DIAGNOSIS — O09523 Supervision of elderly multigravida, third trimester: Secondary | ICD-10-CM

## 2018-12-09 DIAGNOSIS — O24410 Gestational diabetes mellitus in pregnancy, diet controlled: Secondary | ICD-10-CM

## 2018-12-09 MED ORDER — METFORMIN HCL ER 500 MG PO TB24: 1000.0000 mg | ORAL_TABLET | Freq: Two times a day (BID) | ORAL | 11 refills | Status: DC

## 2018-12-09 NOTE — Progress Notes (Signed)
Subjective:  Tiffany Reid is a 43 y.o. O9G2952 at [redacted]w[redacted]d being seen today for ongoing prenatal care.  She is currently monitored for the following issues for this high-risk pregnancy and has Encounter for supervision of high-risk pregnancy with multigravida of advanced maternal age on their problem list.  Patient reports no complaints.  Contractions: Irritability. Vag. Bleeding: None.  Movement: Present. Denies leaking of fluid.   The following portions of the patient's history were reviewed and updated as appropriate: allergies, current medications, past family history, past medical history, past social history, past surgical history and problem list. Problem list updated.  Objective:   Vitals:   12/09/18 0945  BP: 113/70  Pulse: 99  Weight: 251 lb 3.2 oz (113.9 kg)    Fetal Status:     Movement: Present     General:  Alert, oriented and cooperative. Patient is in no acute distress.  Skin: Skin is warm and dry. No rash noted.   Cardiovascular: Normal heart rate noted  Respiratory: Normal respiratory effort, no problems with respiration noted  Abdomen: Soft, gravid, appropriate for gestational age. Pain/Pressure: Present     Pelvic:  Cervical exam deferred        Extremities: Normal range of motion.  Edema: None  Mental Status: Normal mood and affect. Normal behavior. Normal judgment and thought content.   Urinalysis:      Assessment and Plan:  Pregnancy: W4X3244 at [redacted]w[redacted]d  1. Encounter for supervision of high-risk pregnancy with multigravida of advanced maternal age  2. Diet controlled gestational diabetes mellitus (GDM), antepartum - FBS this morning = 130 mg/dl ( usually > 010 }.  2 hour postprandials ~ 110 - 155 mg/dl Rx: - Metformin 272 mg XR.  Take 1000 mg po bid.  ( 1000 mg po after breakfast and 1000 mg po after supper ) - Hemoglobin A1c  Preterm labor symptoms and general obstetric precautions including but not limited to vaginal bleeding, contractions, leaking of  fluid and fetal movement were reviewed in detail with the patient. Please refer to After Visit Summary for other counseling recommendations.  Return in about 2 weeks (around 12/23/2018) for ROB.   Brock Bad, MD

## 2018-12-09 NOTE — Progress Notes (Signed)
Pt is here for ROB. [redacted]w[redacted]d.  

## 2018-12-10 LAB — HEMOGLOBIN A1C
Est. average glucose Bld gHb Est-mCnc: 126 mg/dL
Hgb A1c MFr Bld: 6 % — ABNORMAL HIGH (ref 4.8–5.6)

## 2018-12-11 ENCOUNTER — Encounter (HOSPITAL_COMMUNITY): Payer: Self-pay

## 2018-12-11 ENCOUNTER — Other Ambulatory Visit: Payer: Self-pay | Admitting: *Deleted

## 2018-12-11 ENCOUNTER — Other Ambulatory Visit (HOSPITAL_COMMUNITY): Payer: Self-pay | Admitting: *Deleted

## 2018-12-11 ENCOUNTER — Ambulatory Visit (HOSPITAL_COMMUNITY)
Admission: RE | Admit: 2018-12-11 | Discharge: 2018-12-11 | Disposition: A | Discharge status: Home / Self Care | Payer: Medicaid Other | Source: Ambulatory Visit | Attending: Obstetrics | Admitting: Obstetrics

## 2018-12-11 DIAGNOSIS — O09523 Supervision of elderly multigravida, third trimester: Secondary | ICD-10-CM

## 2018-12-11 DIAGNOSIS — O24419 Gestational diabetes mellitus in pregnancy, unspecified control: Secondary | ICD-10-CM

## 2018-12-11 DIAGNOSIS — O99213 Obesity complicating pregnancy, third trimester: Secondary | ICD-10-CM | POA: Insufficient documentation

## 2018-12-11 DIAGNOSIS — Z3A30 30 weeks gestation of pregnancy: Secondary | ICD-10-CM

## 2018-12-11 DIAGNOSIS — O09529 Supervision of elderly multigravida, unspecified trimester: Secondary | ICD-10-CM

## 2018-12-11 NOTE — Progress Notes (Signed)
Spoke with pt about Metformin use.  Pt states she has had major GI upset with use. Pt states she just left MFM at [facility] and states they want her started on Insulin. Per Dr Debroah Loop, pt is to be scheduled at [facility] clinic for Rx and dosage along with Diabetic/Insulin teaching.  Pt aware she will be contacted regarding appt.

## 2018-12-16 ENCOUNTER — Other Ambulatory Visit: Payer: Medicaid Other

## 2018-12-18 ENCOUNTER — Other Ambulatory Visit: Payer: Self-pay | Admitting: *Deleted

## 2018-12-18 ENCOUNTER — Ambulatory Visit: Payer: Medicaid Other | Admitting: *Deleted

## 2018-12-18 ENCOUNTER — Encounter: Payer: Medicaid Other | Admitting: *Deleted

## 2018-12-18 DIAGNOSIS — O24410 Gestational diabetes mellitus in pregnancy, diet controlled: Secondary | ICD-10-CM | POA: Diagnosis not present

## 2018-12-18 DIAGNOSIS — O24419 Gestational diabetes mellitus in pregnancy, unspecified control: Secondary | ICD-10-CM

## 2018-12-18 DIAGNOSIS — O09529 Supervision of elderly multigravida, unspecified trimester: Secondary | ICD-10-CM

## 2018-12-18 MED ORDER — "INSULIN SYRINGE 31G X 5/16"" 0.5 ML MISC": 1.0000 | Freq: Two times a day (BID) | 6 refills | Status: DC

## 2018-12-18 MED ORDER — INSULIN NPH (HUMAN) (ISOPHANE) 100 UNIT/ML ~~LOC~~ SUSP: SUBCUTANEOUS | 3 refills | Status: DC

## 2018-12-18 NOTE — Addendum Note (Signed)
Addended by: Gerome Apley on: 12/18/2018 11:27 AM   Modules accepted: Orders

## 2018-12-18 NOTE — Progress Notes (Signed)
Insulin Instruction  Patient was seen on 12/18/2018 for insulin instruction.   Patient states current weight is 253 pounds (115 kg) and she is [redacted]w[redacted]d I spoke with Dr. MArlina Robesregarding her FBG are elevated to 110-118 mg/dl and post meal BG are running 120-140 mg/dl so they are not as high as the AM numbers. We decided to use NPH twice daily with 10 units in AM and 20 units at bedtime. We will decide if she needs meal time insulin after she is seen for follow up in 1-2 weeks  The following learning objectives were met by the patient during this visit:   Insulin Action of  insulins  Reviewed syringe & vial including # units per syringe,    length of needles  Hygiene and storage  Drawing up single and mixed doses if using vials   Single dose  Rotation of Sites  Hypoglycemia- symptoms, causes , treatment choices  Record keeping and MD follow up   Patient demonstrated understanding of insulin administration by return demonstration.  Patient received the following handouts:  Insulin Instruction Handout                                        Patient to start on insulin as Rx'd by MD  Patient will be seen for follow-up as needed.    .Marland Kitchen

## 2018-12-23 ENCOUNTER — Inpatient Hospital Stay (HOSPITAL_BASED_OUTPATIENT_CLINIC_OR_DEPARTMENT_OTHER): Payer: Medicaid Other

## 2018-12-23 ENCOUNTER — Encounter (HOSPITAL_COMMUNITY): Payer: Self-pay

## 2018-12-23 ENCOUNTER — Other Ambulatory Visit: Payer: Self-pay

## 2018-12-23 ENCOUNTER — Inpatient Hospital Stay (HOSPITAL_COMMUNITY)
Admission: AD | Admit: 2018-12-23 | Discharge: 2018-12-23 | Disposition: A | Discharge status: Home / Self Care | Payer: Medicaid Other | Source: Ambulatory Visit | Attending: Obstetrics and Gynecology | Admitting: Obstetrics and Gynecology

## 2018-12-23 ENCOUNTER — Ambulatory Visit (INDEPENDENT_AMBULATORY_CARE_PROVIDER_SITE_OTHER): Payer: Medicaid Other | Admitting: Obstetrics & Gynecology

## 2018-12-23 VITALS — BP 114/71 | HR 108 | Wt 254.9 lb

## 2018-12-23 DIAGNOSIS — O09523 Supervision of elderly multigravida, third trimester: Secondary | ICD-10-CM

## 2018-12-23 DIAGNOSIS — O26893 Other specified pregnancy related conditions, third trimester: Secondary | ICD-10-CM | POA: Diagnosis present

## 2018-12-23 DIAGNOSIS — Z0371 Encounter for suspected problem with amniotic cavity and membrane ruled out: Secondary | ICD-10-CM

## 2018-12-23 DIAGNOSIS — N898 Other specified noninflammatory disorders of vagina: Secondary | ICD-10-CM | POA: Diagnosis not present

## 2018-12-23 DIAGNOSIS — O219 Vomiting of pregnancy, unspecified: Secondary | ICD-10-CM

## 2018-12-23 DIAGNOSIS — O42913 Preterm premature rupture of membranes, unspecified as to length of time between rupture and onset of labor, third trimester: Secondary | ICD-10-CM

## 2018-12-23 DIAGNOSIS — Z3A32 32 weeks gestation of pregnancy: Secondary | ICD-10-CM

## 2018-12-23 DIAGNOSIS — O24414 Gestational diabetes mellitus in pregnancy, insulin controlled: Secondary | ICD-10-CM

## 2018-12-23 DIAGNOSIS — O24419 Gestational diabetes mellitus in pregnancy, unspecified control: Secondary | ICD-10-CM | POA: Insufficient documentation

## 2018-12-23 DIAGNOSIS — O09529 Supervision of elderly multigravida, unspecified trimester: Secondary | ICD-10-CM

## 2018-12-23 DIAGNOSIS — O4290 Premature rupture of membranes, unspecified as to length of time between rupture and onset of labor, unspecified weeks of gestation: Secondary | ICD-10-CM

## 2018-12-23 DIAGNOSIS — O42919 Preterm premature rupture of membranes, unspecified as to length of time between rupture and onset of labor, unspecified trimester: Secondary | ICD-10-CM

## 2018-12-23 HISTORY — DX: Herpesviral infection of urogenital system, unspecified: A60.00

## 2018-12-23 LAB — AMNISURE RUPTURE OF MEMBRANE (ROM) NOT AT ARMC: AMNISURE: NEGATIVE

## 2018-12-23 LAB — URINALYSIS, ROUTINE W REFLEX MICROSCOPIC
Bilirubin Urine: NEGATIVE
Glucose, UA: NEGATIVE mg/dL
Hgb urine dipstick: NEGATIVE
Ketones, ur: 80 mg/dL — AB
LEUKOCYTES UA: NEGATIVE
Nitrite: NEGATIVE
Protein, ur: NEGATIVE mg/dL
Specific Gravity, Urine: 1.013 (ref 1.005–1.030)
pH: 6 (ref 5.0–8.0)

## 2018-12-23 MED ORDER — BETAMETHASONE SOD PHOS & ACET 6 (3-3) MG/ML IJ SUSP
12.0000 mg | Freq: Once | INTRAMUSCULAR | Status: AC
  Administered 2018-12-23: 12 mg via INTRAMUSCULAR
  Filled 2018-12-23: qty 2

## 2018-12-23 MED ORDER — INSULIN NPH (HUMAN) (ISOPHANE) 100 UNIT/ML ~~LOC~~ SUSP: SUBCUTANEOUS | 3 refills | Status: DC

## 2018-12-23 MED ORDER — PROMETHAZINE HCL 25 MG PO TABS: 25.0000 mg | ORAL_TABLET | Freq: Four times a day (QID) | ORAL | 2 refills | Status: DC | PRN

## 2018-12-23 MED ORDER — FAMOTIDINE 20 MG PO TABS: 20.0000 mg | ORAL_TABLET | Freq: Two times a day (BID) | ORAL | 3 refills | Status: DC

## 2018-12-23 NOTE — Progress Notes (Addendum)
   PRENATAL VISIT NOTE  Subjective:  Tiffany Reid is a 43 y.o. O9G2952G6P2032 at 2052w4d being seen today for ongoing prenatal care.  She is currently monitored for the following issues for this high-risk pregnancy and has Encounter for supervision of high-risk pregnancy with multigravida of advanced maternal age and Insulin controlled gestational diabetes mellitus (GDM) in third trimester on their problem list.  Patient reports vomiting with some blood noted at end this morning, accompanied by gush of clear fluid "with white stuff" from vagina.  Contractions: Irritability. Vag. Bleeding: None.  Movement: Present. Denies leaking of fluid.   The following portions of the patient's history were reviewed and updated as appropriate: allergies, current medications, past family history, past medical history, past social history, past surgical history and problem list. Problem list updated.  Objective:   Vitals:   12/23/18 0936  BP: 114/71  Pulse: (!) 108  Weight: 254 lb 14.4 oz (115.6 kg)    Fetal Status: Fetal Heart Rate (bpm): 146 Fundal Height: 34 cm Movement: Present     General:  Alert, oriented and cooperative. Patient is in no acute distress.  Skin: Skin is warm and dry. No rash noted.   Cardiovascular: Normal heart rate noted  Respiratory: Normal respiratory effort, no problems with respiration noted  Abdomen: Soft, gravid, appropriate for gestational age.  Pain/Pressure: Present     Pelvic: Cervical exam deferred     No pool, pH paper positive for pH >5.5, no ferning  Extremities: Normal range of motion.  Edema: None  Mental Status: Normal mood and affect. Normal behavior. Normal judgment and thought content.   Assessment and Plan:  Pregnancy: W4X3244G6P2032 at 8152w4d  1. Suspected Preterm premature rupture of membranes (PPROM) with unknown onset of labor Litmus paper lit up during exam, but no pool or fern. Will send to MAU for confirmation with Amnisure and/or ultrasound.  Patient was  counseled about plan of care if PPROM is confirmed. MAU staff, Dr. Alysia PennaErvin notified.  2. Vomiting during pregnancy in third trimester Needs Pepcid therapy for acid reflux protection.  3. Insulin controlled gestational diabetes mellitus (GDM) in third trimester Reports fastings 100-110s, normal PP.  Needs qhs NPH increased; currently on 10/25.   4. Encounter for supervision of high-risk pregnancy with multigravida of advanced maternal age To MAU now for evaluation.  Please refer to After Visit Summary for other counseling recommendations.    Future Appointments  Date Time Provider Department Center  12/25/2018  1:00 PM [facility] US 3 [facility]  12/31/2018 10:00 AM Hermina StaggersErvin, Michael L, MD [facility] None  01/01/2019  1:00 PM [facility] US 3 [facility]US  01/08/2019  1:00 PM [facility] US 3 [facility]US    Tiffany CollinsUgonna Devantae Babe, MD    Addendum Patient was seen in MAU, Amnisure was negative, normal AFV on ultrasound. Ruled out for PPROM; please refer to MAU note for more details.  Called patient at home, follow up appointment made for her for next week. Told to increase NPH at nighttime to 25 units as instructed. Antenatal testing scheduled for patient. Pepcid prescribed as recommended above, also Phenergan was prescribed for nausea.   Preterm labor, PPROM and fetal movement precautions reviewed.   Tiffany CollinsUgonna Marshal Eskew, MD

## 2018-12-23 NOTE — Discharge Instructions (Signed)

## 2018-12-23 NOTE — Progress Notes (Signed)
ROB.  C/o vomiting blood this morning and gush of fluid going down legs.

## 2018-12-23 NOTE — MAU Note (Signed)
Urine sent to lab 

## 2018-12-23 NOTE — Addendum Note (Signed)
Addended by: Jaynie Collins A on: 12/23/2018 04:30 PM   Modules accepted: Orders

## 2018-12-23 NOTE — MAU Note (Signed)
Pt presents to MAU with c/o PROM. Pt states she felt leaking of fluid this morning around 0830. She has not had any abdominal pain or bleeding. She has felt pressure and has felt baby moving.

## 2018-12-23 NOTE — MAU Provider Note (Signed)
Chief Complaint:  Rupture of Membranes   First Provider Initiated Contact with Patient 12/23/18 1205     HPI: Tiffany Reid is a 43 y.o. P5W6568 at 50w4dwho presents to maternity admissions from the office reporting leaking of fluid. Had gush of clear fluid with "white specks" this morning after an episode of vomiting.  Was seen in the office & had pelvic exam. No pooling on exam, but nitrizine was positive. Sent here for further evaluation.   Denies contractions or vaginal bleeding. Good fetal movement.    Past Medical History:  Diagnosis Date  . Anemia   . Chronic pain   . Diabetes mellitus without complication ([facility]   . Genital herpes   . Neuropathy    OB History  Gravida Para Term Preterm AB Living  '6 2 2   3 2  ' SAB TAB Ectopic Multiple Live Births  3       2    # Outcome Date GA Lbr Len/2nd Weight Sex Delivery Anes PTL Lv  6 Current           5 Term 09/25/02     Vag-Spont   LIV  4 Term 08/28/97     Vag-Spont   LIV  3 SAB           2 SAB           1 SAB            Past Surgical History:  Procedure Laterality Date  . DILATION AND CURETTAGE OF UTERUS     Family History  Problem Relation Age of Onset  . Diabetes Mother   . Heart disease Paternal Grandmother    Social History   Tobacco Use  . Smoking status: Never Smoker  . Smokeless tobacco: Never Used  Substance Use Topics  . Alcohol use: Not Currently    Comment: socially  . Drug use: No   Allergies  Allergen Reactions  . Penicillins Itching and Swelling    Throat itches and hand and neck swell   No medications prior to admission.    I have reviewed patient's Past Medical Hx, Surgical Hx, Family Hx, Social Hx, medications and allergies.   ROS:  Review of Systems  Constitutional: Negative.   Gastrointestinal: Positive for nausea and vomiting. Negative for abdominal pain.  Genitourinary: Positive for vaginal discharge. Negative for vaginal bleeding.    Physical Exam   Patient Vitals for the  past 24 hrs:  BP Temp Temp src Pulse Resp SpO2 Height Weight  12/23/18 1316 118/67 - - - - - - -  12/23/18 1144 110/81 98.5 F (36.9 C) Oral (!) 118 16 99 % - -  12/23/18 1137 - - - - - - '5\' 9"'  (1.753 m) 114.3 kg    Constitutional: Well-developed, well-nourished female in no acute distress.  Cardiovascular: normal rate & rhythm, no murmur Respiratory: normal effort, lung sounds clear throughout GI: Abd soft, non-tender, gravid appropriate for gestational age. Pos BS x 4 MS: Extremities nontender, no edema, normal ROM Neurologic: Alert and oriented x 4.  GU:    Deferred as it was just done in the office  NST:  Baseline: 145 bpm, Variability: Good {> 6 bpm), Accelerations: Reactive and Decelerations: Absent   Labs: Results for orders placed or performed during the hospital encounter of 12/23/18 (from the past 24 hour(s))  Urinalysis, Routine w reflex microscopic     Status: Abnormal   Collection Time: 12/23/18 11:45 AM  Result Value Ref Range  Color, Urine YELLOW YELLOW   APPearance CLEAR CLEAR   Specific Gravity, Urine 1.013 1.005 - 1.030   pH 6.0 5.0 - 8.0   Glucose, UA NEGATIVE NEGATIVE mg/dL   Hgb urine dipstick NEGATIVE NEGATIVE   Bilirubin Urine NEGATIVE NEGATIVE   Ketones, ur 80 (A) NEGATIVE mg/dL   Protein, ur NEGATIVE NEGATIVE mg/dL   Nitrite NEGATIVE NEGATIVE   Leukocytes, UA NEGATIVE NEGATIVE  Amnisure rupture of membrane (rom)not at [facility]     Status: None   Collection Time: 12/23/18 12:10 PM  Result Value Ref Range   Amnisure ROM NEGATIVE     Imaging:  [place] Mfm Ob Limited  Result Date: 12/23/2018 ----------------------------------------------------------------------  OBSTETRICS REPORT                       (Signed Final 12/23/2018 03:19 pm) ---------------------------------------------------------------------- Patient Info  ID #:       [id number]                          D.O.B.:  18-Nov-1976 (42 yrs)  Name:       Tiffany Reid                   Visit Date: 12/23/2018  12:36 pm ---------------------------------------------------------------------- Performed By  Performed By:     Benay Spice RDMS       Ref. Address:      [address]                                                              [place],                                                              [place] [address]  Attending:        Sander Nephew      Location:          [facility]                    MD  Referred By:      Tresea Mall CNM ---------------------------------------------------------------------- Orders   #  Description                           Code        Ordered By   1  [place] MFM OB LIMITED                     96222.97    Jorje Guild  ----------------------------------------------------------------------   #  Order #                     Accession #                Episode #   1  [id number]  [id number]                 [id number]  ---------------------------------------------------------------------- Indications   Premature rupture of membranes - leaking        O42.90   fluid (negative amnisure)   [redacted] weeks gestation of pregnancy                 Z3A.32  ---------------------------------------------------------------------- Vital Signs                                                 Height:        5'9" ---------------------------------------------------------------------- Fetal Evaluation  Num Of Fetuses:          1  Fetal Heart              145  Rate(bpm):  Cardiac Activity:        Observed  Presentation:            Cephalic  Placenta:                Posterior  P. Cord Insertion:       Previously Visualized  Amniotic Fluid  AFI FV:      Within normal limits  AFI Sum(cm)     %Tile       Largest Pocket(cm)  10.7            22          3.3  RUQ(cm)       RLQ(cm)       LUQ(cm)        LLQ(cm)  3.3           1.9           2.3            3.2 ---------------------------------------------------------------------- OB History  Gravidity:    6         Term:   2          SAB:   3  Living:       2 ---------------------------------------------------------------------- Gestational Age  LMP:           32w 4d        Date:  05/09/18                 EDD:    02/13/19  Best:          Milderd Meager 4d     Det. By:  LMP  (05/09/18)          EDD:    02/13/19 ---------------------------------------------------------------------- Anatomy  Stomach:               Appears normal,        Bladder:                Appears normal                         left sided ---------------------------------------------------------------------- Cervix Uterus Adnexa  Cervix  Not visualized (advanced GA >24wks) ---------------------------------------------------------------------- Impression  Normal amniotic fluid ---------------------------------------------------------------------- Recommendations  Follow up as clinically indicated. ----------------------------------------------------------------------               Sander Nephew, MD Electronically Signed Final Report   12/23/2018 03:19 pm ----------------------------------------------------------------------   MAU Course: Orders Placed This Encounter  Procedures  . [place] MFM OB  LIMITED  . Urinalysis, Routine w reflex microscopic  . Amnisure rupture of membrane (rom)not at [facility]  . Discharge patient   Meds ordered this encounter  Medications  . betamethasone acetate-betamethasone sodium phosphate (CELESTONE) injection 12 mg    MDM: Amnisure Limited OB ultrasound BMZ given  Amnisure negative & normal AFI on ultrasound.  Pt given BMZ prior to results. Is increasing her night time insulin per Dr. Harolyn Rutherford. Discussed with Dr. Rip Harbour, no need to additionally alter her insulin dosing based on the steroid administration.   Assessment: 1. Encounter for suspected PROM, with rupture of membranes not found   2. [redacted] weeks gestation of pregnancy   3. Vaginal discharge during pregnancy in third trimester     Plan: Discharge home in stable condition.  Preterm  Labor precautions and fetal kick counts  Follow-up Information    [place] Follow up.   Why:  Call office to schedule next OB appointment Contact information: [address] [place] [address] [phone]          Allergies as of 12/23/2018      Reactions   Penicillins Itching, Swelling   Throat itches and hand and neck swell      Medication List    STOP taking these medications   metFORMIN 500 MG 24 hr tablet Commonly known as:  GLUCOPHAGE XR   valACYclovir 500 MG tablet Commonly known as:  VALTREX     TAKE these medications   ACCU-CHEK FASTCLIX LANCETS Misc 1 kit by Percutaneous route 4 (four) times daily.   ACCU-CHEK GUIDE ME w/Device Kit 1 kit by Does not apply route 4 (four) times daily.   DICLEGIS 10-10 MG Tbec Generic drug:  Doxylamine-Pyridoxine Take by mouth.   ferrous sulfate 325 (65 FE) MG tablet Take 1 tablet (325 mg total) by mouth 2 (two) times daily with a meal.   glucose blood test strip Commonly known as:  ACCU-CHEK SMARTVIEW Use as instructed to check blood sugars   glucose blood test strip Commonly known as:  ACCU-CHEK GUIDE Use as instructed   INSULIN SYRINGE .5CC/31GX5/16" 31G X 5/16" 0.5 ML Misc 1 each by Does not apply route 2 (two) times daily.   prenatal multivitamin Tabs tablet Take 1 tablet by mouth daily at 12 noon.       Jorje Guild, NP 12/23/2018 5:46 PM

## 2018-12-25 ENCOUNTER — Ambulatory Visit (HOSPITAL_COMMUNITY)
Admission: RE | Admit: 2018-12-25 | Discharge: 2018-12-25 | Disposition: A | Discharge status: Home / Self Care | Payer: Medicaid Other | Source: Ambulatory Visit | Attending: Obstetrics | Admitting: Obstetrics

## 2018-12-25 ENCOUNTER — Encounter (HOSPITAL_COMMUNITY): Payer: Self-pay

## 2018-12-25 ENCOUNTER — Other Ambulatory Visit (HOSPITAL_COMMUNITY): Payer: Self-pay | Admitting: Obstetrics and Gynecology

## 2018-12-25 DIAGNOSIS — Z3A32 32 weeks gestation of pregnancy: Secondary | ICD-10-CM | POA: Diagnosis not present

## 2018-12-25 DIAGNOSIS — O24419 Gestational diabetes mellitus in pregnancy, unspecified control: Secondary | ICD-10-CM | POA: Diagnosis not present

## 2018-12-25 DIAGNOSIS — O99213 Obesity complicating pregnancy, third trimester: Secondary | ICD-10-CM | POA: Diagnosis not present

## 2018-12-25 DIAGNOSIS — O24414 Gestational diabetes mellitus in pregnancy, insulin controlled: Secondary | ICD-10-CM | POA: Diagnosis not present

## 2018-12-25 DIAGNOSIS — O09523 Supervision of elderly multigravida, third trimester: Secondary | ICD-10-CM | POA: Diagnosis not present

## 2018-12-25 NOTE — Procedures (Signed)
Tiffany Reid 1976-03-04 [redacted]w[redacted]d  Fetus A Non-Stress Test Interpretation for 12/25/18  Indication: Gestational Diabetes medication controlled  Fetal Heart Rate A Mode: External Baseline Rate (A): 145 bpm Variability: Moderate Accelerations: 15 x 15 Decelerations: None Multiple birth?: No  Uterine Activity Mode: Toco Contraction Duration (sec): none noted  Interpretation (Fetal Testing) Nonstress Test Interpretation: Reactive Comments: FHR tracing rev'd by Dr. Judeth Cornfield

## 2018-12-31 ENCOUNTER — Ambulatory Visit (INDEPENDENT_AMBULATORY_CARE_PROVIDER_SITE_OTHER): Payer: Medicaid Other | Admitting: Obstetrics and Gynecology

## 2018-12-31 ENCOUNTER — Encounter: Payer: Self-pay | Admitting: Obstetrics and Gynecology

## 2018-12-31 VITALS — BP 122/77 | HR 88 | Wt 252.0 lb

## 2018-12-31 DIAGNOSIS — O24414 Gestational diabetes mellitus in pregnancy, insulin controlled: Secondary | ICD-10-CM

## 2018-12-31 DIAGNOSIS — O09529 Supervision of elderly multigravida, unspecified trimester: Secondary | ICD-10-CM

## 2018-12-31 DIAGNOSIS — O09523 Supervision of elderly multigravida, third trimester: Secondary | ICD-10-CM

## 2018-12-31 MED ORDER — INSULIN NPH (HUMAN) (ISOPHANE) 100 UNIT/ML ~~LOC~~ SUSP: SUBCUTANEOUS | 3 refills | Status: DC

## 2018-12-31 NOTE — Progress Notes (Signed)
Subjective:  Tiffany Reid is a 43 y.o. Y1O1751 at [redacted]w[redacted]d being seen today for ongoing prenatal care.  She is currently monitored for the following issues for this high-risk pregnancy and has Encounter for supervision of high-risk pregnancy with multigravida of advanced maternal age and Insulin controlled gestational diabetes mellitus (GDM) in third trimester on their problem list.  Patient reports general discomforts.  Contractions: Not present. Vag. Bleeding: None.  Movement: Present. Denies leaking of fluid.   The following portions of the patient's history were reviewed and updated as appropriate: allergies, current medications, past family history, past medical history, past social history, past surgical history and problem list. Problem list updated.  Objective:   Vitals:   12/31/18 1004  BP: 122/77  Pulse: 88  Weight: 252 lb (114.3 kg)    Fetal Status: Fetal Heart Rate (bpm): 156   Movement: Present     General:  Alert, oriented and cooperative. Patient is in no acute distress.  Skin: Skin is warm and dry. No rash noted.   Cardiovascular: Normal heart rate noted  Respiratory: Normal respiratory effort, no problems with respiration noted  Abdomen: Soft, gravid, appropriate for gestational age. Pain/Pressure: Present     Pelvic:  Cervical exam deferred        Extremities: Normal range of motion.  Edema: Trace  Mental Status: Normal mood and affect. Normal behavior. Normal judgment and thought content.   Urinalysis:      Assessment and Plan:  Pregnancy: W2H8527 at [redacted]w[redacted]d  1. Encounter for supervision of high-risk pregnancy with multigravida of advanced maternal age Stable - insulin NPH Human (HUMULIN N,NOVOLIN N) 100 UNIT/ML injection; Use 12 units every morning daily Use 30 units at bedtime daily  Dispense: 10 mL; Refill: 3  2. Insulin controlled gestational diabetes mellitus (GDM) in third trimester CBG's not in goal range per pt Did not bring log Importance of bring log  to appts discussed  Continue with weekly antenatal testing - insulin NPH Human (HUMULIN N,NOVOLIN N) 100 UNIT/ML injection; Use 12 units every morning daily Use 30 units at bedtime daily  Dispense: 10 mL; Refill: 3  Preterm labor symptoms and general obstetric precautions including but not limited to vaginal bleeding, contractions, leaking of fluid and fetal movement were reviewed in detail with the patient. Please refer to After Visit Summary for other counseling recommendations.  Return in about 1 week (around 01/07/2019) for OB visit.   Hermina Staggers, MD

## 2018-12-31 NOTE — Progress Notes (Signed)
MAU visit on 12/23/18 pt states she was leaking fluid  Pt denies any leaking today.  Pt did not bring log of sugars  pt states they have been fine.  No complaints.   Pt has BPP U/S scheduled 01/01/19

## 2019-01-01 ENCOUNTER — Ambulatory Visit (HOSPITAL_COMMUNITY)
Admission: RE | Admit: 2019-01-01 | Discharge: 2019-01-01 | Disposition: A | Discharge status: Home / Self Care | Payer: Medicaid Other | Source: Ambulatory Visit | Attending: Obstetrics | Admitting: Obstetrics

## 2019-01-01 ENCOUNTER — Encounter (HOSPITAL_COMMUNITY): Payer: Self-pay

## 2019-01-01 DIAGNOSIS — O99213 Obesity complicating pregnancy, third trimester: Secondary | ICD-10-CM | POA: Diagnosis not present

## 2019-01-01 DIAGNOSIS — O24414 Gestational diabetes mellitus in pregnancy, insulin controlled: Secondary | ICD-10-CM

## 2019-01-01 DIAGNOSIS — O09523 Supervision of elderly multigravida, third trimester: Secondary | ICD-10-CM | POA: Diagnosis not present

## 2019-01-01 DIAGNOSIS — Z3A33 33 weeks gestation of pregnancy: Secondary | ICD-10-CM

## 2019-01-01 DIAGNOSIS — O24419 Gestational diabetes mellitus in pregnancy, unspecified control: Secondary | ICD-10-CM | POA: Diagnosis present

## 2019-01-07 ENCOUNTER — Ambulatory Visit (INDEPENDENT_AMBULATORY_CARE_PROVIDER_SITE_OTHER): Payer: Medicaid Other | Admitting: Obstetrics and Gynecology

## 2019-01-07 VITALS — BP 110/72 | HR 101 | Wt 253.3 lb

## 2019-01-07 DIAGNOSIS — O24414 Gestational diabetes mellitus in pregnancy, insulin controlled: Secondary | ICD-10-CM

## 2019-01-07 DIAGNOSIS — O09523 Supervision of elderly multigravida, third trimester: Secondary | ICD-10-CM

## 2019-01-07 DIAGNOSIS — O09529 Supervision of elderly multigravida, unspecified trimester: Secondary | ICD-10-CM

## 2019-01-07 NOTE — Progress Notes (Signed)
Subjective:  Tiffany Reid is a 43 y.o. F8H8299 at [redacted]w[redacted]d being seen today for ongoing prenatal care.  She is currently monitored for the following issues for this high-risk pregnancy and has Encounter for supervision of high-risk pregnancy with multigravida of advanced maternal age and Insulin controlled gestational diabetes mellitus (GDM) in third trimester on their problem list.  Patient reports general discomforts of pregnancy.  Contractions: Not present. Vag. Bleeding: None.  Movement: Present. Denies leaking of fluid.   The following portions of the patient's history were reviewed and updated as appropriate: allergies, current medications, past family history, past medical history, past social history, past surgical history and problem list. Problem list updated.  Objective:   Vitals:   01/07/19 0848  BP: 110/72  Pulse: (!) 101  Weight: 253 lb 4.8 oz (114.9 kg)    Fetal Status: Fetal Heart Rate (bpm): 150   Movement: Present     General:  Alert, oriented and cooperative. Patient is in no acute distress.  Skin: Skin is warm and dry. No rash noted.   Cardiovascular: Normal heart rate noted  Respiratory: Normal respiratory effort, no problems with respiration noted  Abdomen: Soft, gravid, appropriate for gestational age. Pain/Pressure: Present     Pelvic:  Cervical exam deferred        Extremities: Normal range of motion.  Edema: Trace  Mental Status: Normal mood and affect. Normal behavior. Normal judgment and thought content.   Urinalysis:      Assessment and Plan:  Pregnancy: B7J6967 at [redacted]w[redacted]d  1. Encounter for supervision of high-risk pregnancy with multigravida of advanced maternal age Stable  2. Insulin controlled gestational diabetes mellitus (GDM) in third trimester CBG's in goal range Pt congratulated Continue with current insulin regiment BPP tomorrow Continue with weekly antenatal testing  Preterm labor symptoms and general obstetric precautions including but not  limited to vaginal bleeding, contractions, leaking of fluid and fetal movement were reviewed in detail with the patient. Please refer to After Visit Summary for other counseling recommendations.  Return in about 1 week (around 01/14/2019) for OB visit.   Hermina Staggers, MD

## 2019-01-07 NOTE — Progress Notes (Signed)
Patient reports fetal movement with occasional pressure. 

## 2019-01-08 ENCOUNTER — Encounter (HOSPITAL_COMMUNITY): Payer: Self-pay

## 2019-01-08 ENCOUNTER — Other Ambulatory Visit (HOSPITAL_COMMUNITY): Payer: Self-pay | Admitting: Obstetrics and Gynecology

## 2019-01-08 ENCOUNTER — Ambulatory Visit (HOSPITAL_COMMUNITY)
Admission: RE | Admit: 2019-01-08 | Discharge: 2019-01-08 | Disposition: A | Discharge status: Home / Self Care | Payer: Medicaid Other | Source: Ambulatory Visit | Attending: Obstetrics | Admitting: Obstetrics

## 2019-01-08 DIAGNOSIS — O24414 Gestational diabetes mellitus in pregnancy, insulin controlled: Secondary | ICD-10-CM

## 2019-01-08 DIAGNOSIS — O09523 Supervision of elderly multigravida, third trimester: Secondary | ICD-10-CM | POA: Diagnosis not present

## 2019-01-08 DIAGNOSIS — O99213 Obesity complicating pregnancy, third trimester: Secondary | ICD-10-CM | POA: Diagnosis not present

## 2019-01-08 DIAGNOSIS — O24419 Gestational diabetes mellitus in pregnancy, unspecified control: Secondary | ICD-10-CM | POA: Diagnosis present

## 2019-01-08 DIAGNOSIS — Z3A34 34 weeks gestation of pregnancy: Secondary | ICD-10-CM

## 2019-01-08 DIAGNOSIS — Z362 Encounter for other antenatal screening follow-up: Secondary | ICD-10-CM

## 2019-01-09 ENCOUNTER — Other Ambulatory Visit (HOSPITAL_COMMUNITY): Payer: Self-pay | Admitting: *Deleted

## 2019-01-09 DIAGNOSIS — O24414 Gestational diabetes mellitus in pregnancy, insulin controlled: Secondary | ICD-10-CM

## 2019-01-14 ENCOUNTER — Ambulatory Visit (INDEPENDENT_AMBULATORY_CARE_PROVIDER_SITE_OTHER): Payer: Medicaid Other | Admitting: Obstetrics and Gynecology

## 2019-01-14 VITALS — BP 123/74 | HR 86 | Wt 256.6 lb

## 2019-01-14 DIAGNOSIS — O09523 Supervision of elderly multigravida, third trimester: Secondary | ICD-10-CM

## 2019-01-14 DIAGNOSIS — Z3A35 35 weeks gestation of pregnancy: Secondary | ICD-10-CM

## 2019-01-14 DIAGNOSIS — O24414 Gestational diabetes mellitus in pregnancy, insulin controlled: Secondary | ICD-10-CM

## 2019-01-14 DIAGNOSIS — O09529 Supervision of elderly multigravida, unspecified trimester: Secondary | ICD-10-CM | POA: Insufficient documentation

## 2019-01-14 NOTE — Progress Notes (Signed)
Pt is here for ROB. [redacted]w[redacted]d.  

## 2019-01-14 NOTE — Progress Notes (Signed)
Subjective:  Tiffany Reid is a 43 y.o. T5T7322 at [redacted]w[redacted]d being seen today for ongoing prenatal care.  She is currently monitored for the following issues for this high-risk pregnancy and has Encounter for supervision of high-risk pregnancy with multigravida of advanced maternal age; Insulin controlled gestational diabetes mellitus (GDM) in third trimester; and AMA (advanced maternal age) multigravida 35+ on their problem list.  Patient reports general discomforts of pregnancy.  Contractions: Irregular. Vag. Bleeding: None.  Movement: Present. Denies leaking of fluid.   The following portions of the patient's history were reviewed and updated as appropriate: allergies, current medications, past family history, past medical history, past social history, past surgical history and problem list. Problem list updated.  Objective:   Vitals:   01/14/19 1013  BP: 123/74  Pulse: 86  Weight: 256 lb 9.6 oz (116.4 kg)    Fetal Status: Fetal Heart Rate (bpm): 150   Movement: Present     General:  Alert, oriented and cooperative. Patient is in no acute distress.  Skin: Skin is warm and dry. No rash noted.   Cardiovascular: Normal heart rate noted  Respiratory: Normal respiratory effort, no problems with respiration noted  Abdomen: Soft, gravid, appropriate for gestational age. Pain/Pressure: Present     Pelvic:  Cervical exam deferred        Extremities: Normal range of motion.  Edema: Trace  Mental Status: Normal mood and affect. Normal behavior. Normal judgment and thought content.   Urinalysis:      Assessment and Plan:  Pregnancy: G2R4270 at [redacted]w[redacted]d  1. Encounter for supervision of high-risk pregnancy with multigravida of advanced maternal age Stable GBS and vag cultures next visit  2. Insulin controlled gestational diabetes mellitus (GDM) in third trimester CBG's in goal range for most part. A few outliers related to diet choice Continue with current insulin regiment BPP tomorrow  3.  Multigravida of advanced maternal age in third trimester LR NIPS  Preterm labor symptoms and general obstetric precautions including but not limited to vaginal bleeding, contractions, leaking of fluid and fetal movement were reviewed in detail with the patient. Please refer to After Visit Summary for other counseling recommendations.  Return in about 1 week (around 01/21/2019) for OB visit.   Hermina Staggers, MD

## 2019-01-15 ENCOUNTER — Ambulatory Visit (HOSPITAL_COMMUNITY)
Admission: RE | Admit: 2019-01-15 | Discharge: 2019-01-15 | Disposition: A | Discharge status: Home / Self Care | Payer: Medicaid Other | Source: Ambulatory Visit | Attending: Obstetrics | Admitting: Obstetrics

## 2019-01-15 ENCOUNTER — Encounter (HOSPITAL_COMMUNITY): Payer: Self-pay

## 2019-01-15 DIAGNOSIS — O99213 Obesity complicating pregnancy, third trimester: Secondary | ICD-10-CM

## 2019-01-15 DIAGNOSIS — Z3A35 35 weeks gestation of pregnancy: Secondary | ICD-10-CM

## 2019-01-15 DIAGNOSIS — O24414 Gestational diabetes mellitus in pregnancy, insulin controlled: Secondary | ICD-10-CM | POA: Diagnosis not present

## 2019-01-15 DIAGNOSIS — O09523 Supervision of elderly multigravida, third trimester: Secondary | ICD-10-CM

## 2019-01-21 ENCOUNTER — Other Ambulatory Visit: Payer: Self-pay

## 2019-01-21 ENCOUNTER — Ambulatory Visit (INDEPENDENT_AMBULATORY_CARE_PROVIDER_SITE_OTHER): Payer: Medicaid Other | Admitting: Obstetrics & Gynecology

## 2019-01-21 ENCOUNTER — Encounter: Payer: Self-pay | Admitting: Obstetrics & Gynecology

## 2019-01-21 ENCOUNTER — Other Ambulatory Visit (HOSPITAL_COMMUNITY)
Admission: RE | Admit: 2019-01-21 | Discharge: 2019-01-21 | Disposition: A | Discharge status: Home / Self Care | Payer: Medicaid Other | Source: Ambulatory Visit | Attending: Obstetrics & Gynecology | Admitting: Obstetrics & Gynecology

## 2019-01-21 VITALS — BP 130/80 | HR 83 | Wt 259.4 lb

## 2019-01-21 DIAGNOSIS — O09523 Supervision of elderly multigravida, third trimester: Secondary | ICD-10-CM

## 2019-01-21 DIAGNOSIS — Z3A36 36 weeks gestation of pregnancy: Secondary | ICD-10-CM

## 2019-01-21 DIAGNOSIS — O24414 Gestational diabetes mellitus in pregnancy, insulin controlled: Secondary | ICD-10-CM

## 2019-01-21 DIAGNOSIS — O09529 Supervision of elderly multigravida, unspecified trimester: Secondary | ICD-10-CM | POA: Insufficient documentation

## 2019-01-21 NOTE — Progress Notes (Signed)
   PRENATAL VISIT NOTE  Subjective:  Tiffany Reid is a 43 y.o. [id number] at [redacted]w[redacted]d being seen today for ongoing prenatal care.  She is currently monitored for the following issues for this high-risk pregnancy and has Encounter for supervision of high-risk pregnancy with multigravida of advanced maternal age; Insulin controlled gestational diabetes mellitus (GDM) in third trimester; and AMA (advanced maternal age) multigravida 35+ on their problem list.  Patient reports occasional contractions.  Contractions: Irregular. Vag. Bleeding: None.  Movement: Present. Denies leaking of fluid.   The following portions of the patient's history were reviewed and updated as appropriate: allergies, current medications, past family history, past medical history, past social history, past surgical history and problem list. Problem list updated.  Objective:   Vitals:   01/21/19 1050  BP: 130/80  Pulse: 83  Weight: 259 lb 6.4 oz (117.7 kg)    Fetal Status: Fetal Heart Rate (bpm): 154   Movement: Present  Presentation: Vertex  General:  Alert, oriented and cooperative. Patient is in no acute distress.  Skin: Skin is warm and dry. No rash noted.   Cardiovascular: Normal heart rate noted  Respiratory: Normal respiratory effort, no problems with respiration noted  Abdomen: Soft, gravid, appropriate for gestational age.  Pain/Pressure: Present     Pelvic: Cervical exam performed Dilation: 2.5 Effacement (%): 50 Station: -3  Extremities: Normal range of motion.  Edema: Trace  Mental Status: Normal mood and affect. Normal behavior. Normal judgment and thought content.   Assessment and Plan:  Pregnancy: V7S8270 at [redacted]w[redacted]d  1. Encounter for supervision of high-risk pregnancy with multigravida of advanced maternal age Routine testing - Cervicovaginal ancillary only( [place]) - Strep Gp B NAA  2. Insulin controlled gestational diabetes mellitus (GDM) in third trimester States values are in range but did  not bring her record  3. Multigravida of advanced maternal age in third trimester LGA fetus, f/u US 2/27  Preterm labor symptoms and general obstetric precautions including but not limited to vaginal bleeding, contractions, leaking of fluid and fetal movement were reviewed in detail with the patient. Please refer to After Visit Summary for other counseling recommendations.  Return in about 1 week (around 01/28/2019).  Future Appointments  Date Time Provider Department Center  01/22/2019 12:45 PM [facility] [place] [facility]  01/28/2019 10:45 AM Brock Bad, MD [facility] None  01/29/2019 12:30 PM [facility] [place] [facility]    Scheryl Darter, MD

## 2019-01-21 NOTE — Progress Notes (Signed)
ROB GBS 

## 2019-01-21 NOTE — Patient Instructions (Signed)

## 2019-01-22 ENCOUNTER — Ambulatory Visit (HOSPITAL_COMMUNITY)
Admission: RE | Admit: 2019-01-22 | Discharge: 2019-01-22 | Disposition: A | Discharge status: Home / Self Care | Payer: Medicaid Other | Source: Ambulatory Visit | Attending: Obstetrics | Admitting: Obstetrics

## 2019-01-22 ENCOUNTER — Encounter (HOSPITAL_COMMUNITY): Payer: Self-pay

## 2019-01-22 DIAGNOSIS — O99213 Obesity complicating pregnancy, third trimester: Secondary | ICD-10-CM

## 2019-01-22 DIAGNOSIS — O09523 Supervision of elderly multigravida, third trimester: Secondary | ICD-10-CM | POA: Diagnosis not present

## 2019-01-22 DIAGNOSIS — Z3A36 36 weeks gestation of pregnancy: Secondary | ICD-10-CM | POA: Diagnosis not present

## 2019-01-22 DIAGNOSIS — O24414 Gestational diabetes mellitus in pregnancy, insulin controlled: Secondary | ICD-10-CM | POA: Diagnosis not present

## 2019-01-23 LAB — CERVICOVAGINAL ANCILLARY ONLY
Chlamydia: NEGATIVE
Neisseria Gonorrhea: NEGATIVE

## 2019-01-23 LAB — STREP GP B NAA: Strep Gp B NAA: NEGATIVE

## 2019-01-24 ENCOUNTER — Inpatient Hospital Stay (HOSPITAL_COMMUNITY)
Admission: AD | Admit: 2019-01-24 | Discharge: 2019-01-27 | DRG: 806 | Disposition: A | Discharge status: Home / Self Care | Payer: Medicaid Other | Attending: Obstetrics & Gynecology | Admitting: Obstetrics & Gynecology

## 2019-01-24 ENCOUNTER — Encounter (HOSPITAL_COMMUNITY): Payer: Self-pay | Admitting: *Deleted

## 2019-01-24 ENCOUNTER — Other Ambulatory Visit: Payer: Self-pay

## 2019-01-24 DIAGNOSIS — Z880 Allergy status to penicillin: Secondary | ICD-10-CM | POA: Diagnosis not present

## 2019-01-24 DIAGNOSIS — O24424 Gestational diabetes mellitus in childbirth, insulin controlled: Secondary | ICD-10-CM | POA: Diagnosis present

## 2019-01-24 DIAGNOSIS — O3663X0 Maternal care for excessive fetal growth, third trimester, not applicable or unspecified: Secondary | ICD-10-CM | POA: Diagnosis present

## 2019-01-24 DIAGNOSIS — O9832 Other infections with a predominantly sexual mode of transmission complicating childbirth: Secondary | ICD-10-CM | POA: Diagnosis present

## 2019-01-24 DIAGNOSIS — A6000 Herpesviral infection of urogenital system, unspecified: Secondary | ICD-10-CM | POA: Diagnosis present

## 2019-01-24 DIAGNOSIS — O1493 Unspecified pre-eclampsia, third trimester: Secondary | ICD-10-CM

## 2019-01-24 DIAGNOSIS — O1414 Severe pre-eclampsia complicating childbirth: Secondary | ICD-10-CM | POA: Diagnosis present

## 2019-01-24 DIAGNOSIS — O1490 Unspecified pre-eclampsia, unspecified trimester: Secondary | ICD-10-CM | POA: Diagnosis present

## 2019-01-24 DIAGNOSIS — Z3A37 37 weeks gestation of pregnancy: Secondary | ICD-10-CM | POA: Diagnosis not present

## 2019-01-24 LAB — CBC
HEMATOCRIT: 34.3 % — AB (ref 36.0–46.0)
Hemoglobin: 11 g/dL — ABNORMAL LOW (ref 12.0–15.0)
MCH: 30.2 pg (ref 26.0–34.0)
MCHC: 32.1 g/dL (ref 30.0–36.0)
MCV: 94.2 fL (ref 80.0–100.0)
Platelets: 208 10*3/uL (ref 150–400)
RBC: 3.64 MIL/uL — ABNORMAL LOW (ref 3.87–5.11)
RDW: 16.1 % — ABNORMAL HIGH (ref 11.5–15.5)
WBC: 9.6 10*3/uL (ref 4.0–10.5)
nRBC: 0 % (ref 0.0–0.2)

## 2019-01-24 LAB — COMPREHENSIVE METABOLIC PANEL
ALT: 19 U/L (ref 0–44)
AST: 24 U/L (ref 15–41)
Albumin: 2.6 g/dL — ABNORMAL LOW (ref 3.5–5.0)
Alkaline Phosphatase: 160 U/L — ABNORMAL HIGH (ref 38–126)
Anion gap: 8 (ref 5–15)
BUN: <5 mg/dL — ABNORMAL LOW (ref 6–20)
CO2: 22 mmol/L (ref 22–32)
Calcium: 8.3 mg/dL — ABNORMAL LOW (ref 8.9–10.3)
Chloride: 106 mmol/L (ref 98–111)
Creatinine, Ser: 0.52 mg/dL (ref 0.44–1.00)
GFR calc Af Amer: >60 mL/min (ref >60)
Glucose, Bld: 81 mg/dL (ref 70–99)
Potassium: 3.5 mmol/L (ref 3.5–5.1)
Sodium: 136 mmol/L (ref 135–145)
TOTAL PROTEIN: 6.3 g/dL — AB (ref 6.5–8.1)
Total Bilirubin: 0.6 mg/dL (ref 0.3–1.2)

## 2019-01-24 LAB — PROTEIN / CREATININE RATIO, URINE
Creatinine, Urine: 54 mg/dL
Protein Creatinine Ratio: 0.33 mg/mg{Cre} — ABNORMAL HIGH (ref 0.00–0.15)
Total Protein, Urine: 18 mg/dL

## 2019-01-24 LAB — GLUCOSE, CAPILLARY
GLUCOSE-CAPILLARY: 111 mg/dL — AB (ref 70–99)
Glucose-Capillary: 119 mg/dL — ABNORMAL HIGH (ref 70–99)
Glucose-Capillary: 74 mg/dL (ref 70–99)
Glucose-Capillary: 80 mg/dL (ref 70–99)
Glucose-Capillary: 82 mg/dL (ref 70–99)
Glucose-Capillary: 86 mg/dL (ref 70–99)
Glucose-Capillary: 86 mg/dL (ref 70–99)

## 2019-01-24 LAB — TYPE AND SCREEN
ABO/RH(D): A POS
Antibody Screen: NEGATIVE

## 2019-01-24 LAB — ABO/RH: ABO/RH(D): A POS

## 2019-01-24 MED ORDER — DEXTROSE-NACL 5-0.45 % IV SOLN
INTRAVENOUS | Status: DC
  Administered 2019-01-24: 50 mL/h via INTRAVENOUS

## 2019-01-24 MED ORDER — LACTATED RINGERS IV SOLN: 500.0000 mL | INTRAVENOUS | Status: DC | PRN

## 2019-01-24 MED ORDER — FENTANYL CITRATE (PF) 100 MCG/2ML IJ SOLN
100.0000 ug | INTRAMUSCULAR | Status: DC | PRN
  Administered 2019-01-25: 100 ug via INTRAVENOUS
  Filled 2019-01-24: qty 2

## 2019-01-24 MED ORDER — OXYTOCIN 40 UNITS IN NORMAL SALINE INFUSION - SIMPLE MED: 2.5000 [IU]/h | INTRAVENOUS | Status: DC

## 2019-01-24 MED ORDER — ACETAMINOPHEN 325 MG PO TABS: 650.0000 mg | ORAL_TABLET | ORAL | Status: DC | PRN

## 2019-01-24 MED ORDER — INSULIN REGULAR(HUMAN) IN NACL 100-0.9 UT/100ML-% IV SOLN
INTRAVENOUS | Status: DC
  Administered 2019-01-24: 0.2 [IU]/h via INTRAVENOUS
  Filled 2019-01-24: qty 100

## 2019-01-24 MED ORDER — LABETALOL HCL 5 MG/ML IV SOLN: 20.0000 mg | INTRAVENOUS | Status: DC | PRN

## 2019-01-24 MED ORDER — TERBUTALINE SULFATE 1 MG/ML IJ SOLN: 0.2500 mg | Freq: Once | INTRAMUSCULAR | Status: DC | PRN

## 2019-01-24 MED ORDER — DEXTROSE 50 % IV SOLN: 25.0000 mL | INTRAVENOUS | Status: DC | PRN

## 2019-01-24 MED ORDER — LACTATED RINGERS IV SOLN
INTRAVENOUS | Status: DC
  Administered 2019-01-24: 17:00:00 via INTRAVENOUS

## 2019-01-24 MED ORDER — LABETALOL HCL 5 MG/ML IV SOLN: 80.0000 mg | INTRAVENOUS | Status: DC | PRN

## 2019-01-24 MED ORDER — SOD CITRATE-CITRIC ACID 500-334 MG/5ML PO SOLN: 30.0000 mL | ORAL | Status: DC | PRN

## 2019-01-24 MED ORDER — OXYCODONE-ACETAMINOPHEN 5-325 MG PO TABS: 2.0000 | ORAL_TABLET | ORAL | Status: DC | PRN

## 2019-01-24 MED ORDER — LIDOCAINE HCL (PF) 1 % IJ SOLN: 30.0000 mL | INTRAMUSCULAR | Status: DC | PRN

## 2019-01-24 MED ORDER — OXYTOCIN 40 UNITS IN NORMAL SALINE INFUSION - SIMPLE MED
1.0000 m[IU]/min | INTRAVENOUS | Status: DC
  Administered 2019-01-24: 2 m[IU]/min via INTRAVENOUS
  Filled 2019-01-24: qty 1000

## 2019-01-24 MED ORDER — MAGNESIUM SULFATE BOLUS VIA INFUSION
4.0000 g | Freq: Once | INTRAVENOUS | Status: AC
  Administered 2019-01-24: 4 g via INTRAVENOUS
  Filled 2019-01-24: qty 500

## 2019-01-24 MED ORDER — OXYTOCIN BOLUS FROM INFUSION
500.0000 mL | Freq: Once | INTRAVENOUS | Status: AC
  Administered 2019-01-25: 500 mL via INTRAVENOUS

## 2019-01-24 MED ORDER — LABETALOL HCL 5 MG/ML IV SOLN: 40.0000 mg | INTRAVENOUS | Status: DC | PRN

## 2019-01-24 MED ORDER — HYDRALAZINE HCL 20 MG/ML IJ SOLN: 10.0000 mg | INTRAMUSCULAR | Status: DC | PRN

## 2019-01-24 MED ORDER — SODIUM CHLORIDE 0.9 % IV SOLN: INTRAVENOUS | Status: DC

## 2019-01-24 MED ORDER — ONDANSETRON HCL 4 MG/2ML IJ SOLN: 4.0000 mg | Freq: Four times a day (QID) | INTRAMUSCULAR | Status: DC | PRN

## 2019-01-24 MED ORDER — INSULIN REGULAR BOLUS VIA INFUSION
0.0000 [IU] | Freq: Three times a day (TID) | INTRAVENOUS | Status: DC
  Filled 2019-01-24: qty 10

## 2019-01-24 MED ORDER — MAGNESIUM SULFATE 40 G IN LACTATED RINGERS - SIMPLE
2.0000 g/h | INTRAVENOUS | Status: DC
  Administered 2019-01-24: 2 g/h via INTRAVENOUS
  Filled 2019-01-24: qty 500

## 2019-01-24 MED ORDER — OXYCODONE-ACETAMINOPHEN 5-325 MG PO TABS: 1.0000 | ORAL_TABLET | ORAL | Status: DC | PRN

## 2019-01-24 MED ORDER — FLEET ENEMA 7-19 GM/118ML RE ENEM: 1.0000 | ENEMA | RECTAL | Status: DC | PRN

## 2019-01-24 NOTE — Progress Notes (Signed)
LABOR PROGRESS NOTE  Tiffany Reid is a 43 y.o. B0S1115 at [redacted]w[redacted]d  admitted for IOL for severe Pre-E.   Subjective: Feeling some more contractions, otherwise comfortable. FOB at bedside.   Objective: BP 139/68   Pulse 94   Temp 97.8 F (36.6 C) (Oral)   Resp 16   Ht 5\' 9"  (1.753 m)   Wt 118.4 kg   LMP 05/09/2018 (Exact Date)   SpO2 100%   BMI 38.54 kg/m  or  Vitals:   01/24/19 1942 01/24/19 2001 01/24/19 2031 01/24/19 2101  BP: 133/72 130/65 135/69 139/68  Pulse: 89 86 83 94  Resp: 16 16 16 16   Temp: 97.6 F (36.4 C)  97.8 F (36.6 C)   TempSrc: Oral  Oral   SpO2:      Weight:      Height:        Dilation: 3 Effacement (%): 50 Cervical Position: Middle Station: -3 Presentation: Vertex Exam by:: Verdie Drown, RN FHT: baseline rate 130, moderate varibility, + acel, no decel Toco: q3-5 min   Labs: Lab Results  Component Value Date   WBC 9.6 01/24/2019   HGB 11.0 (L) 01/24/2019   HCT 34.3 (L) 01/24/2019   MCV 94.2 01/24/2019   PLT 208 01/24/2019    Patient Active Problem List   Diagnosis Date Noted  . Preeclampsia 01/24/2019  . AMA (advanced maternal age) multigravida 35+ 01/14/2019  . Insulin controlled gestational diabetes mellitus (GDM) in third trimester 12/23/2018  . Encounter for supervision of high-risk pregnancy with multigravida of advanced maternal age 80/27/2019    Assessment / Plan: 43 y.o. [id number] at [redacted]w[redacted]d here for IOL for severe Pre-E.   Severe Pre-E: On Mg++. No severe range pressures.  GDM: On glucose stabilizer. Last CBG 119. LGA infant with >90% on sono on 2/6, shoulder dystocia precautions at time of delivery.  Labor: Induction. On pitocin at 10 mu/min.  Fetal Wellbeing:  Cat I  Pain Control:  Not planning for epidural.  Anticipated MOD:  NSVD.   Marcy Siren, D.O. OB Fellow  01/24/2019, 9:09 PM

## 2019-01-24 NOTE — MAU Note (Signed)
Urine in lab 

## 2019-01-24 NOTE — MAU Note (Signed)
Tiffany Reid is a 43 y.o. at [redacted]w[redacted]d here in MAU reporting: having ctx since this AM, states they come about every 10 min. No vaginal bleeding, no LOF. Reports decreased fetal movement. On Wednesday she was 2.5/50  Onset of complaint: this AM  Pain score: 3/10  Vitals:   01/24/19 1346  BP: (!) 148/86  Pulse: 92  Resp: 18  Temp: 98.4 F (36.9 C)  SpO2: 100%      ORV:IFBPPHK attempted, FHT heard but doppler not registering, fetal movement palpated by RN and patient  Lab orders placed from triage: none

## 2019-01-24 NOTE — H&P (Addendum)
LABOR AND DELIVERY ADMISSION HISTORY AND PHYSICAL NOTE  Tiffany Reid is a 43 y.o. female [phone] with IUP at 47w1dby LMP presenting for contractions and elevated found to have Preeclampsia with severe features including headache and proteinuria.  She reports positive fetal movement. She denies leakage of fluid or vaginal bleeding.  Prenatal History/Complications: PNC at [facility] Pregnancy complications:  - Preeclampsia with severe features  - Insulin dependent GDM -  NPH 12U qAM, 30U qHS - LGA with EFW 3748g >90% on 01/08/19. Largest baby 8lb 7oz.  - H/o HSV on Valtrex   Past Medical History: Past Medical History:  Diagnosis Date  . Anemia   . Chronic pain   . Diabetes mellitus without complication ([place]   . Genital herpes   . Neuropathy     Past Surgical History: Past Surgical History:  Procedure Laterality Date  . DILATION AND CURETTAGE OF UTERUS      Obstetrical History: OB History    Gravida  6   Para  2   Term  2   Preterm      AB  3   Living  2     SAB  3   TAB      Ectopic      Multiple      Live Births  2           Social History: Social History   Socioeconomic History  . Marital status: Single    Spouse name: Not on file  . Number of children: Not on file  . Years of education: Not on file  . Highest education level: Not on file  Occupational History  . Not on file  Social Needs  . Financial resource strain: Not on file  . Food insecurity:    Worry: Not on file    Inability: Not on file  . Transportation needs:    Medical: Not on file    Non-medical: Not on file  Tobacco Use  . Smoking status: Never Smoker  . Smokeless tobacco: Never Used  Substance and Sexual Activity  . Alcohol use: Not Currently    Comment: socially  . Drug use: No  . Sexual activity: Yes    Birth control/protection: None  Lifestyle  . Physical activity:    Days per week: Not on file    Minutes per session: Not on file  . Stress: Not on file   Relationships  . Social connections:    Talks on phone: Not on file    Gets together: Not on file    Attends religious service: Not on file    Active member of club or organization: Not on file    Attends meetings of clubs or organizations: Not on file    Relationship status: Not on file  Other Topics Concern  . Not on file  Social History Narrative  . Not on file    Family History: Family History  Problem Relation Age of Onset  . Diabetes Mother   . Heart disease Paternal Grandmother     Allergies: Allergies  Allergen Reactions  . Penicillins Itching and Swelling    Throat itches and hand and neck swell    Medications Prior to Admission  Medication Sig Dispense Refill Last Dose  . ACCU-CHEK FASTCLIX LANCETS MISC 1 kit by Percutaneous route 4 (four) times daily. 100 each 12 Taking  . Blood Glucose Monitoring Suppl (ACCU-CHEK GUIDE ME) w/Device KIT 1 kit by Does not apply route 4 (four) times daily.  1 kit 0 Taking  . Doxylamine-Pyridoxine (DICLEGIS) 10-10 MG TBEC Take by mouth.   Taking  . famotidine (PEPCID) 20 MG tablet Take 1 tablet (20 mg total) by mouth 2 (two) times daily. (Patient not taking: Reported on 12/25/2018) 60 tablet 3 Not Taking  . ferrous sulfate 325 (65 FE) MG tablet Take 1 tablet (325 mg total) by mouth 2 (two) times daily with a meal. 60 tablet 5 Taking  . glucose blood (ACCU-CHEK GUIDE) test strip Use as instructed 100 each 12 Taking  . glucose blood (ACCU-CHEK SMARTVIEW) test strip Use as instructed to check blood sugars 100 each 12 Taking  . insulin NPH Human (HUMULIN N,NOVOLIN N) 100 UNIT/ML injection Use 12 units every morning daily Use 30 units at bedtime daily 10 mL 3 Taking  . Insulin Syringe-Needle U-100 (INSULIN SYRINGE .5CC/31GX5/16") 31G X 5/16" 0.5 ML MISC 1 each by Does not apply route 2 (two) times daily. 60 each 6 Taking  . Prenatal Vit-Fe Fumarate-FA (PRENATAL MULTIVITAMIN) TABS tablet Take 1 tablet by mouth daily at 12 noon.   Taking  .  promethazine (PHENERGAN) 25 MG tablet Take 1 tablet (25 mg total) by mouth every 6 (six) hours as needed for nausea or vomiting. (Patient not taking: Reported on 12/25/2018) 30 tablet 2 Not Taking  . valACYclovir HCl (VALTREX PO) Take by mouth.   Taking     Review of Systems  All systems reviewed and negative except as stated in HPI  Physical Exam Blood pressure (!) 145/74, pulse 83, temperature 98.3 F (36.8 C), temperature source Oral, resp. rate 16, height '5\' 9"'  (1.753 m), weight 118.4 kg, last menstrual period 05/09/2018, SpO2 100 %, unknown if currently breastfeeding. General appearance: alert, oriented, NAD Lungs: normal respiratory effort Heart: regular rate Abdomen: soft, non-tender; gravid, FH appropriate for GA Extremities: No calf swelling or tenderness Presentation: cephalic Fetal monitoring: Baseline 135 bmp, moderate variability, + acels, no decels Uterine activity: Irregular/variable Dilation: 3 Effacement (%): 50 Station: -3 Exam by:: Varney Baas, RN  Prenatal labs: ABO, Rh: --/--/A POS (02/22 1530) Antibody: NEG (02/22 1530)  Rubella:  Immune RPR: Non Reactive (12/20 1105)  HBsAg:   Negative HIV: Non Reactive (12/20 1105)  GC/Chlamydia: negative (01/21/19) GBS: Negative (02/19 1117)  2-hr GTT: Abnormal 119/276/250 Genetic screening:  NIPS: low risk, AFP: neg Anatomy US: Normal  Prenatal Transfer Tool  Maternal Diabetes: Yes:  Diabetes Type:  Insulin/Medication controlled Genetic Screening: Normal Maternal Ultrasounds/Referrals: Normal Fetal Ultrasounds or other Referrals:  None Maternal Substance Abuse:  No Significant Maternal Medications:  Meds include: Other:  Pepcid, Iron, NPH, Valtrex Significant Maternal Lab Results: Lab values include: Group B Strep negative, Other: Protein/Cr ratio: 0.33  Results for orders placed or performed during the hospital encounter of 01/24/19 (from the past 24 hour(s))  Protein / creatinine ratio, urine   Collection  Time: 01/24/19  2:44 PM  Result Value Ref Range   Creatinine, Urine 54.00 mg/dL   Total Protein, Urine 18 mg/dL   Protein Creatinine Ratio 0.33 (H) 0.00 - 0.15 mg/mg[Cre]  CBC   Collection Time: 01/24/19  3:30 PM  Result Value Ref Range   WBC 9.6 4.0 - 10.5 K/uL   RBC 3.64 (L) 3.87 - 5.11 MIL/uL   Hemoglobin 11.0 (L) 12.0 - 15.0 g/dL   HCT 34.3 (L) 36.0 - 46.0 %   MCV 94.2 80.0 - 100.0 fL   MCH 30.2 26.0 - 34.0 pg   MCHC 32.1 30.0 - 36.0 g/dL   RDW  16.1 (H) 11.5 - 15.5 %   Platelets 208 150 - 400 K/uL   nRBC 0.0 0.0 - 0.2 %  Comprehensive metabolic panel   Collection Time: 01/24/19  3:30 PM  Result Value Ref Range   Sodium 136 135 - 145 mmol/L   Potassium 3.5 3.5 - 5.1 mmol/L   Chloride 106 98 - 111 mmol/L   CO2 22 22 - 32 mmol/L   Glucose, Bld 81 70 - 99 mg/dL   BUN <5 (L) 6 - 20 mg/dL   Creatinine, Ser 0.52 0.44 - 1.00 mg/dL   Calcium 8.3 (L) 8.9 - 10.3 mg/dL   Total Protein 6.3 (L) 6.5 - 8.1 g/dL   Albumin 2.6 (L) 3.5 - 5.0 g/dL   AST 24 15 - 41 U/L   ALT 19 0 - 44 U/L   Alkaline Phosphatase 160 (H) 38 - 126 U/L   Total Bilirubin 0.6 0.3 - 1.2 mg/dL   GFR calc non Af Amer >60 >60 mL/min   GFR calc Af Amer >60 >60 mL/min   Anion gap 8 5 - 15  Type and screen [place]   Collection Time: 01/24/19  3:30 PM  Result Value Ref Range   ABO/RH(D) A POS    Antibody Screen NEG    Sample Expiration      01/27/2019 Performed at [facility], [address] [place], [place] [address]   Glucose, capillary   Collection Time: 01/24/19  5:48 PM  Result Value Ref Range   Glucose-Capillary 74 70 - 99 mg/dL  Glucose, capillary   Collection Time: 01/24/19  6:29 PM  Result Value Ref Range   Glucose-Capillary 82 70 - 99 mg/dL    Patient Active Problem List   Diagnosis Date Noted  . Preeclampsia 01/24/2019  . AMA (advanced maternal age) multigravida 35+ 01/14/2019  . Insulin controlled gestational diabetes mellitus (GDM) in third trimester 12/23/2018   . Encounter for supervision of high-risk pregnancy with multigravida of advanced maternal age 55/27/2019    Assessment: Tiffany Reid is a 43 y.o. [id number] at 44w1dhere for IOL for Preeclampsia with severe features.   #Labor: Active management - start Pitocin #Pain: IV pain meds/Epidural upon request  #FWB: CAT1 #ID: GBS neg #MOF: Formula #MOC: Pill at PPV #Circ:  N/A #Pre-E: Symptoms improved since admission. Will start Mag 4g bolus with 2g/hr, Labetalol for BP >160/>110 #GDM: Glucose stabilizer, CBG q1 hour  Tiffany Reid 01/24/2019, 6:42 PM  I confirm that I have verified the information documented in the resident's note and that I have also personally reperformed the history, physical exam and all medical decision making activities of this service and have verified that all service and findings are accurately documented in this student's note.   Tiffany Reid CNorth Dakota2/22/2020 6:51 PM

## 2019-01-25 ENCOUNTER — Encounter (HOSPITAL_COMMUNITY): Payer: Self-pay

## 2019-01-25 DIAGNOSIS — O24424 Gestational diabetes mellitus in childbirth, insulin controlled: Secondary | ICD-10-CM

## 2019-01-25 DIAGNOSIS — O1414 Severe pre-eclampsia complicating childbirth: Secondary | ICD-10-CM

## 2019-01-25 DIAGNOSIS — Z3A37 37 weeks gestation of pregnancy: Secondary | ICD-10-CM

## 2019-01-25 LAB — RPR: RPR Ser Ql: NONREACTIVE

## 2019-01-25 LAB — TYPE AND SCREEN
ABO/RH(D): A POS
Antibody Screen: NEGATIVE

## 2019-01-25 LAB — ABO/RH: ABO/RH(D): A POS

## 2019-01-25 MED ORDER — ACETAMINOPHEN 325 MG PO TABS
650.0000 mg | ORAL_TABLET | ORAL | Status: DC | PRN
  Administered 2019-01-26: 650 mg via ORAL
  Filled 2019-01-25: qty 2

## 2019-01-25 MED ORDER — LACTATED RINGERS IV SOLN
INTRAVENOUS | Status: DC
  Administered 2019-01-25: 09:00:00 via INTRAVENOUS

## 2019-01-25 MED ORDER — ZOLPIDEM TARTRATE 5 MG PO TABS: 5.0000 mg | ORAL_TABLET | Freq: Every evening | ORAL | Status: DC | PRN

## 2019-01-25 MED ORDER — BENZOCAINE-MENTHOL 20-0.5 % EX AERO
1.0000 "application " | INHALATION_SPRAY | CUTANEOUS | Status: DC | PRN
  Filled 2019-01-25: qty 56

## 2019-01-25 MED ORDER — MEASLES, MUMPS & RUBELLA VAC IJ SOLR
0.5000 mL | Freq: Once | INTRAMUSCULAR | Status: DC
  Filled 2019-01-25: qty 0.5

## 2019-01-25 MED ORDER — IBUPROFEN 600 MG PO TABS
600.0000 mg | ORAL_TABLET | Freq: Four times a day (QID) | ORAL | Status: DC
  Administered 2019-01-25 – 2019-01-27 (×10): 600 mg via ORAL
  Filled 2019-01-25 (×10): qty 1

## 2019-01-25 MED ORDER — DIBUCAINE 1 % RE OINT
1.0000 "application " | TOPICAL_OINTMENT | RECTAL | Status: DC | PRN
  Filled 2019-01-25: qty 28

## 2019-01-25 MED ORDER — COCONUT OIL OIL
1.0000 "application " | TOPICAL_OIL | Status: DC | PRN
  Filled 2019-01-25: qty 120

## 2019-01-25 MED ORDER — DIPHENHYDRAMINE HCL 25 MG PO CAPS: 25.0000 mg | ORAL_CAPSULE | Freq: Four times a day (QID) | ORAL | Status: DC | PRN

## 2019-01-25 MED ORDER — MAGNESIUM SULFATE 40 G IN LACTATED RINGERS - SIMPLE
2.0000 g/h | INTRAVENOUS | Status: DC
  Administered 2019-01-25 (×2): 2 g/h via INTRAVENOUS
  Filled 2019-01-25: qty 500

## 2019-01-25 MED ORDER — ONDANSETRON HCL 4 MG/2ML IJ SOLN: 4.0000 mg | INTRAMUSCULAR | Status: DC | PRN

## 2019-01-25 MED ORDER — SIMETHICONE 80 MG PO CHEW
80.0000 mg | CHEWABLE_TABLET | ORAL | Status: DC | PRN
  Filled 2019-01-25: qty 1

## 2019-01-25 MED ORDER — SENNOSIDES-DOCUSATE SODIUM 8.6-50 MG PO TABS
2.0000 | ORAL_TABLET | ORAL | Status: DC
  Administered 2019-01-26: 2 via ORAL
  Filled 2019-01-25: qty 2

## 2019-01-25 MED ORDER — MAGNESIUM SULFATE 40 G IN LACTATED RINGERS - SIMPLE: 2.0000 g/h | INTRAVENOUS | Status: AC

## 2019-01-25 MED ORDER — TETANUS-DIPHTH-ACELL PERTUSSIS 5-2.5-18.5 LF-MCG/0.5 IM SUSP
0.5000 mL | Freq: Once | INTRAMUSCULAR | Status: AC
  Administered 2019-01-26: 0.5 mL via INTRAMUSCULAR
  Filled 2019-01-25: qty 0.5

## 2019-01-25 MED ORDER — PRENATAL MULTIVITAMIN CH
1.0000 | ORAL_TABLET | Freq: Every day | ORAL | Status: DC
  Administered 2019-01-25 – 2019-01-27 (×3): 1 via ORAL
  Filled 2019-01-25 (×2): qty 1

## 2019-01-25 MED ORDER — WITCH HAZEL-GLYCERIN EX PADS: 1.0000 "application " | MEDICATED_PAD | CUTANEOUS | Status: DC | PRN

## 2019-01-25 MED ORDER — ONDANSETRON HCL 4 MG PO TABS
4.0000 mg | ORAL_TABLET | ORAL | Status: DC | PRN
  Filled 2019-01-25: qty 1

## 2019-01-25 NOTE — Progress Notes (Signed)
Post Partum Day 0 Subjective: Pt without complaints this AM. Denies HA, epigastric pain, or visual changes. Tolerating diet. Bottle feeding.  Objective: Blood pressure 130/77, pulse 72, temperature 98.1 F (36.7 C), temperature source Oral, resp. rate 19, height 5\' 9"  (1.753 m), weight 118.4 kg, last menstrual period 05/09/2018, SpO2 100 %, unknown if currently breastfeeding.  Physical Exam:  General: alert Lochia: appropriate Uterine Fundus: firm Incision: NA DVT Evaluation: No evidence of DVT seen on physical exam.  Recent Labs    01/24/19 1530  HGB 11.0*  HCT 34.3*    Assessment/Plan: DOD TSVD SPEC  Stable. Continue with Magnesium x 24 hrs. Continue with supportive care. Pt to be transferred to [facility]. Transfer and reasoning for transfer discussed with pt and FOB. Understanding verbalized.    LOS: 1 day   Hermina Staggers 01/25/2019, 4:40 AM

## 2019-01-25 NOTE — Progress Notes (Signed)
@  2000 RN got verbal order from Dr.Eure to d/c Magnesium sulfate at 0100 2/24.

## 2019-01-25 NOTE — Progress Notes (Signed)
Initial visit with Tiffany Reid and [name] to introduce spiritual care services and offer support after their transfer from [facility].  Pt reports her transfer went well and she is eager to settle in.  She was looking at the menu and about to order lunch.  Pt is hopeful she won't have to be inpatient for much longer.  This is her third baby.    Please page as further needs arise.  Maryanna Shape. Carley Hammed, M.Div. [facility] Chaplain Pager [phone] Office [phone]

## 2019-01-26 LAB — GLUCOSE, CAPILLARY: Glucose-Capillary: 95 mg/dL (ref 70–99)

## 2019-01-26 MED ORDER — ENALAPRIL MALEATE 2.5 MG PO TABS
2.5000 mg | ORAL_TABLET | Freq: Every day | ORAL | Status: DC
  Administered 2019-01-26 – 2019-01-27 (×2): 2.5 mg via ORAL
  Filled 2019-01-26 (×3): qty 1

## 2019-01-26 NOTE — Progress Notes (Signed)
Post Partum Day 1 Subjective: no complaints  Objective: Blood pressure 127/60, pulse 70, temperature 97.7 F (36.5 C), temperature source Oral, resp. rate 18, height 5\' 9"  (1.753 m), weight 118.4 kg, last menstrual period 05/09/2018, SpO2 99 %, unknown if currently breastfeeding.  Physical Exam:  General: alert, cooperative and no distress Lochia: appropriate Uterine Fundus: firm Incision: n/a DVT Evaluation: No evidence of DVT seen on physical exam.  Recent Labs    01/24/19 1530  HGB 11.0*  HCT 34.3*    Assessment/Plan: Contraception OCP Routine PP care, may d/c IV access  LOS: 2 days   Scheryl Darter 01/26/2019, 10:15 AM

## 2019-01-26 NOTE — Progress Notes (Signed)
Pt's last two blood pressures have been elevated, but under severe range.  11:53am - 159/92 16:03pm - 147/79  Phoned Dr. Alysia Penna with above information.  See order to start enalapril now.

## 2019-01-27 MED ORDER — IBUPROFEN 600 MG PO TABS: 600.0000 mg | ORAL_TABLET | Freq: Four times a day (QID) | ORAL | 0 refills | Status: DC

## 2019-01-27 MED ORDER — ENALAPRIL MALEATE 5 MG PO TABS: 5.0000 mg | ORAL_TABLET | Freq: Every day | ORAL | 1 refills | Status: DC

## 2019-01-27 NOTE — Discharge Summary (Signed)
  Postpartum Discharge Summary     Patient Name: Tiffany Reid DOB: 07/29/1976 MRN: [id number]  Date of admission: 01/24/2019 Delivering Provider: WALLACE, CATHERINE LAUREN   Date of discharge: 01/27/2019  Admitting diagnosis: 37 WKS, CTXS Intrauterine pregnancy: [redacted]w[redacted]d     Secondary diagnosis:  Active Problems:   Preeclampsia  Additional problems:  Patient Active Problem List   Diagnosis Date Noted  . Preeclampsia 01/24/2019  . AMA (advanced maternal age) multigravida 35+ 01/14/2019  . Insulin controlled gestational diabetes mellitus (GDM) in third trimester 12/23/2018  . Encounter for supervision of high-risk pregnancy with multigravida of advanced maternal age 07/29/2018        Discharge diagnosis: Term Pregnancy Delivered and Preeclampsia (severe)                                                                                                Post partum procedures:none  Augmentation: AROM and Pitocin  Complications: None  Hospital course:  Induction of Labor With Vaginal Delivery   43 y.o. yo G6P3033 at [redacted]w[redacted]d was admitted to the hospital 01/24/2019 for induction of labor.  Indication for induction: Preeclampsia and A2 DM.  Patient had an uncomplicated labor course as follows: Membrane Rupture Time/Date: 12:55 AM ,01/25/2019   Intrapartum Procedures: Episiotomy: None [1]                                         Lacerations:  None [1]  Patient had delivery of a Viable infant.  Information for the patient's newborn:  Reid, Girl Briany [[id number]]      01/25/2019  Details of delivery can be found in separate delivery note.  Patient had a routine postpartum course. Patient is discharged home 01/27/19.  Magnesium Sulfate received: Yes BMZ received: No  Physical exam  Vitals:   01/26/19 2001 01/27/19 0007 01/27/19 0510 01/27/19 0810  BP: (!) 147/83   138/73  Pulse: (!) 58   (!) 57  Resp: 18     Temp: 98.2 F (36.8 C) 97.8 F (36.6 C) (!) 97.5 F (36.4 C) 97.9 F  (36.6 C)  TempSrc: Oral Oral Oral Oral  SpO2: 100%   100%  Weight:      Height:       General: alert, cooperative and no distress Lochia: appropriate Uterine Fundus: firm Incision: N/A DVT Evaluation: No evidence of DVT seen on physical exam. Labs: Lab Results  Component Value Date   WBC 9.6 01/24/2019   HGB 11.0 (L) 01/24/2019   HCT 34.3 (L) 01/24/2019   MCV 94.2 01/24/2019   PLT 208 01/24/2019   CMP Latest Ref Rng & Units 01/24/2019  Glucose 70 - 99 mg/dL 81  BUN 6 - 20 mg/dL <5(L)  Creatinine 0.44 - 1.00 mg/dL 0.52  Sodium 135 - 145 mmol/L 136  Potassium 3.5 - 5.1 mmol/L 3.5  Chloride 98 - 111 mmol/L 106  CO2 22 - 32 mmol/L 22  Calcium 8.9 - 10.3 mg/dL 8.3(L)  Total Protein 6.5 - 8.1 g/dL 6.3(L)  Total   Bilirubin 0.3 - 1.2 mg/dL 0.6  Alkaline Phos 38 - 126 U/L 160(H)  AST 15 - 41 U/L 24  ALT 0 - 44 U/L 19    Discharge instruction: per After Visit Summary and "Baby and Me Booklet".  After visit meds:  Allergies as of 01/27/2019      Reactions   Penicillins Itching, Swelling   Throat itches and hand and neck swell      Medication List    STOP taking these medications   ACCU-CHEK FASTCLIX LANCETS Misc   ACCU-CHEK GUIDE ME w/Device Kit   DICLEGIS 10-10 MG Tbec Generic drug:  Doxylamine-Pyridoxine   famotidine 20 MG tablet Commonly known as:  PEPCID   ferrous sulfate 325 (65 FE) MG tablet   glucose blood test strip Commonly known as:  ACCU-CHEK GUIDE   insulin NPH Human 100 UNIT/ML injection Commonly known as:  HUMULIN N,NOVOLIN N   INSULIN SYRINGE .5CC/31GX5/16" 31G X 5/16" 0.5 ML Misc   promethazine 25 MG tablet Commonly known as:  PHENERGAN   VALTREX PO     TAKE these medications   enalapril 5 MG tablet Commonly known as:  VASOTEC Take 1 tablet (5 mg total) by mouth daily. Start taking on:  January 28, 2019   ibuprofen 600 MG tablet Commonly known as:  ADVIL,MOTRIN Take 1 tablet (600 mg total) by mouth every 6 (six) hours.   prenatal  multivitamin Tabs tablet Take 1 tablet by mouth daily at 12 noon.       Diet: carb modified diet  Activity: Advance as tolerated. Pelvic rest for 6 weeks.   Outpatient follow up:1 week for BP check Follow up Appt: Future Appointments  Date Time Provider Department Center  02/02/2019 10:30 AM [facility] NURSE [facility] None  02/23/2019 10:00 AM Harper, Charles A, MD [facility] None  03/09/2019  9:00 AM [facility] LAB [facility] None   Follow up Visit: Follow-up Information    [facility] Follow up in 1 week(s).   Specialty:  Obstetrics and Gynecology Why:  bp check Contact information: [address], [address] [place] [address] [phone]           Please schedule this patient for Postpartum visit in: 1 week with the following provider: Any provider For C/S patients schedule nurse incision check in weeks 2 weeks: no High risk pregnancy complicated by: GDM Delivery mode:  SVD Anticipated Birth Control:  OCPs PP Procedures needed: BP check  Schedule Integrated BH visit: no      Newborn Data: Live born female  Birth Weight: 9 lb 12.6 oz (4440 g) APGAR: 7, 9  Newborn Delivery   Birth date/time:  01/25/2019 01:05:00 Delivery type:  Vaginal, Spontaneous     Baby Feeding: Bottle Disposition:home with mother   01/27/2019 James Arnold, MD   

## 2019-01-28 ENCOUNTER — Encounter: Payer: Medicaid Other | Admitting: Obstetrics

## 2019-01-29 ENCOUNTER — Ambulatory Visit (HOSPITAL_COMMUNITY): Payer: Medicaid Other

## 2019-02-02 ENCOUNTER — Ambulatory Visit: Payer: Medicaid Other

## 2019-02-02 VITALS — BP 143/88 | HR 59 | Wt 227.6 lb

## 2019-02-02 DIAGNOSIS — Z0130 Encounter for examination of blood pressure without abnormal findings: Secondary | ICD-10-CM

## 2019-02-02 MED ORDER — ENALAPRIL MALEATE 10 MG PO TABS: 10.0000 mg | ORAL_TABLET | Freq: Every day | ORAL | 1 refills | Status: DC

## 2019-02-02 NOTE — Progress Notes (Signed)
..  Subjective:  Tiffany Reid is a 43 y.o. female here for BP check.   Hypertension ROS: taking medications as instructed, no medication side effects noted, no TIA's, no chest pain on exertion, no dyspnea on exertion and no swelling of ankles.    Objective:  BP (!) 143/88   Pulse (!) 59   Wt 227 lb 9.6 oz (103.2 kg)   LMP 05/09/2018 (Exact Date)   Breastfeeding No   BMI 33.61 kg/m   Appearance alert, well appearing, and in no distress. Pt complained of occasional headaches and blurred vision.  Assessment:   Blood Pressure needs improvement.   Plan:  The following changes are to be made: Provider increased dosage of enalapril, pt will follow up at pp visit scheduled for 02-23-19. Advised pt to call if symptoms do not get better before that. Marland Kitchen

## 2019-02-02 NOTE — Progress Notes (Signed)
I have reviewed the chart and agree with nursing staff's documentation of this patient's encounter. Enalapril increased to 10 mg daily Patient advised to monitor si/sx of preeclampsia  Catalina Antigua, MD 02/02/2019 11:38 AM

## 2019-02-03 ENCOUNTER — Telehealth: Payer: Self-pay

## 2019-02-03 NOTE — Telephone Encounter (Signed)
TC to pt following call from [facility] Nurse. Pt B/P was 150/92 today only c/o Headache taking Rx as directed. B/P yesterday was 143/88 in the office.   Pt has B/P appt w/ Nurse on 02/12/19 Provider was made aware per Dr.Arnold pt can continue Rx and keep appt for B/P check  And monitor sx's and contact office if sx's become worse.

## 2019-02-12 ENCOUNTER — Telehealth: Payer: Self-pay

## 2019-02-12 NOTE — Telephone Encounter (Signed)
Patients [facility] nurse called in stating pts bp is 135/91 today and pt is c/o slight headaches and blurred vision. Pt is taking her Vasotec that is increased to 10mg . Advised patient to come in today or tomorrow for BP check in the office.

## 2019-02-13 ENCOUNTER — Inpatient Hospital Stay (HOSPITAL_COMMUNITY)
Admission: AD | Admit: 2019-02-13 | Discharge: 2019-02-13 | Disposition: A | Discharge status: Home / Self Care | Payer: Medicaid Other | Attending: Obstetrics and Gynecology | Admitting: Obstetrics and Gynecology

## 2019-02-13 ENCOUNTER — Other Ambulatory Visit: Payer: Self-pay

## 2019-02-13 ENCOUNTER — Ambulatory Visit: Payer: Medicaid Other | Admitting: *Deleted

## 2019-02-13 ENCOUNTER — Encounter (HOSPITAL_COMMUNITY): Payer: Self-pay | Admitting: *Deleted

## 2019-02-13 VITALS — BP 132/90 | HR 81 | Wt 218.0 lb

## 2019-02-13 DIAGNOSIS — O165 Unspecified maternal hypertension, complicating the puerperium: Secondary | ICD-10-CM | POA: Insufficient documentation

## 2019-02-13 DIAGNOSIS — O169 Unspecified maternal hypertension, unspecified trimester: Secondary | ICD-10-CM

## 2019-02-13 DIAGNOSIS — Z880 Allergy status to penicillin: Secondary | ICD-10-CM | POA: Insufficient documentation

## 2019-02-13 DIAGNOSIS — R51 Headache: Secondary | ICD-10-CM | POA: Diagnosis present

## 2019-02-13 DIAGNOSIS — Z0130 Encounter for examination of blood pressure without abnormal findings: Secondary | ICD-10-CM

## 2019-02-13 HISTORY — DX: Gestational diabetes mellitus in pregnancy, unspecified control: O24.419

## 2019-02-13 LAB — COMPREHENSIVE METABOLIC PANEL
ALT: 21 U/L (ref 0–44)
AST: 23 U/L (ref 15–41)
Albumin: 3.4 g/dL — ABNORMAL LOW (ref 3.5–5.0)
Alkaline Phosphatase: 80 U/L (ref 38–126)
Anion gap: 8 (ref 5–15)
BUN: 12 mg/dL (ref 6–20)
CO2: 23 mmol/L (ref 22–32)
Calcium: 9.2 mg/dL (ref 8.9–10.3)
Chloride: 107 mmol/L (ref 98–111)
Creatinine, Ser: 0.77 mg/dL (ref 0.44–1.00)
GFR calc Af Amer: >60 mL/min (ref >60)
GFR calc non Af Amer: >60 mL/min (ref >60)
Glucose, Bld: 86 mg/dL (ref 70–99)
Potassium: 3.9 mmol/L (ref 3.5–5.1)
Sodium: 138 mmol/L (ref 135–145)
Total Bilirubin: 0.8 mg/dL (ref 0.3–1.2)
Total Protein: 7.2 g/dL (ref 6.5–8.1)

## 2019-02-13 LAB — CBC WITH DIFFERENTIAL/PLATELET
Abs Immature Granulocytes: 0.01 10*3/uL (ref 0.00–0.07)
Basophils Absolute: 0 10*3/uL (ref 0.0–0.1)
Basophils Relative: 1 %
Eosinophils Absolute: 0.1 10*3/uL (ref 0.0–0.5)
Eosinophils Relative: 2 %
HCT: 38.5 % (ref 36.0–46.0)
Hemoglobin: 12.4 g/dL (ref 12.0–15.0)
Immature Granulocytes: 0 %
Lymphocytes Relative: 28 %
Lymphs Abs: 1.6 10*3/uL (ref 0.7–4.0)
MCH: 29.3 pg (ref 26.0–34.0)
MCHC: 32.2 g/dL (ref 30.0–36.0)
MCV: 91 fL (ref 80.0–100.0)
Monocytes Absolute: 0.5 10*3/uL (ref 0.1–1.0)
Monocytes Relative: 9 %
NEUTROS ABS: 3.4 10*3/uL (ref 1.7–7.7)
Neutrophils Relative %: 60 %
Platelets: 328 10*3/uL (ref 150–400)
RBC: 4.23 MIL/uL (ref 3.87–5.11)
RDW: 14 % (ref 11.5–15.5)
WBC: 5.7 10*3/uL (ref 4.0–10.5)
nRBC: 0 % (ref 0.0–0.2)

## 2019-02-13 LAB — URINALYSIS, ROUTINE W REFLEX MICROSCOPIC
Bacteria, UA: NONE SEEN
Bilirubin Urine: NEGATIVE
Glucose, UA: NEGATIVE mg/dL
Ketones, ur: NEGATIVE mg/dL
Nitrite: NEGATIVE
PROTEIN: NEGATIVE mg/dL
Specific Gravity, Urine: 1.024 (ref 1.005–1.030)
pH: 6 (ref 5.0–8.0)

## 2019-02-13 LAB — PROTEIN / CREATININE RATIO, URINE
Creatinine, Urine: 234.76 mg/dL
Protein Creatinine Ratio: 0.07 mg/mg{Cre} (ref 0.00–0.15)
Total Protein, Urine: 17 mg/dL

## 2019-02-13 MED ORDER — ACETAMINOPHEN 500 MG PO TABS
1000.0000 mg | ORAL_TABLET | Freq: Once | ORAL | Status: AC
  Administered 2019-02-13: 1000 mg via ORAL
  Filled 2019-02-13: qty 2

## 2019-02-13 MED ORDER — IBUPROFEN 800 MG PO TABS: 800.0000 mg | ORAL_TABLET | Freq: Three times a day (TID) | ORAL | 0 refills | Status: DC | PRN

## 2019-02-13 NOTE — MAU Note (Signed)
Pt sent from MD office for BP eval.  Reports H/A and visual disturbances, no epigastric pain. S/P vaginal delivery 01/25/19.

## 2019-02-13 NOTE — Progress Notes (Signed)
Agree with A & P. 

## 2019-02-13 NOTE — MAU Provider Note (Addendum)
History     CSN: [id number]  Arrival date and time: 02/13/19 1332   First Provider Initiated Contact with Patient 02/13/19 1537      Chief Complaint  Patient presents with  . BP Evaluation   Tiffany Reid is a 43 y.o. [id number] s/p NSVD after IOL for pre-eclampsia on 01/25/2019, presenting today for evaluation of BP and HA with visual changes. Yesterday (02/12/19), her BP was elevated (135/91) and she's been having headaches off and on since delivery with blurred vision in one eye, occasionally sees spots. She went to the office today and was sent to MAU for further eval. She says her headaches have been mild and relieved with ibuprofen. She denies SOB, dizziness, or epigastric pain.   OB History    Gravida  6   Para  3   Term  3   Preterm      AB  3   Living  3     SAB  3   TAB      Ectopic      Multiple  0   Live Births  3          Past Medical History:  Diagnosis Date  . Anemia   . Chronic pain   . Diabetes mellitus without complication (HCC)   . Genital herpes   . Gestational diabetes   . Neuropathy    Past Surgical History:  Procedure Laterality Date  . DILATION AND CURETTAGE OF UTERUS      Family History  Problem Relation Age of Onset  . Diabetes Mother   . Heart disease Paternal Grandmother     Social History   Tobacco Use  . Smoking status: Never Smoker  . Smokeless tobacco: Never Used  Substance Use Topics  . Alcohol use: Not Currently    Comment: socially  . Drug use: No    Allergies:  Allergies  Allergen Reactions  . Penicillins Itching and Swelling    Did it involve swelling of the face/tongue/throat, SOB, or low BP? Yes Did it involve sudden or severe rash/hives, skin peeling, or any reaction on the inside of your mouth or nose? Yes Did you need to seek medical attention at a hospital or doctor's office? Yes When did it last happen?43 years old If all above answers are "NO", may proceed with cephalosporin use.      Medications Prior to Admission  Medication Sig Dispense Refill Last Dose  . enalapril (VASOTEC) 10 MG tablet Take 1 tablet (10 mg total) by mouth daily. 30 tablet 1 02/13/2019 at Unknown time  . ibuprofen (ADVIL,MOTRIN) 600 MG tablet Take 1 tablet (600 mg total) by mouth every 6 (six) hours. 30 tablet 0 02/12/2019 at Unknown time  . Prenatal Vit-Fe Fumarate-FA (PRENATAL MULTIVITAMIN) TABS tablet Take 1 tablet by mouth daily at 12 noon.   02/12/2019 at Unknown time  . valACYclovir (VALTREX) 500 MG tablet Take 500 mg by mouth 2 (two) times daily.   02/12/2019 at Unknown time    Review of Systems  Constitutional: Negative.  Negative for fatigue and fever.  HENT: Negative.  Negative for congestion.   Eyes: Positive for visual disturbance (spotty and blurred vision). Negative for photophobia.  Respiratory: Negative.  Negative for cough and shortness of breath.   Cardiovascular: Negative.  Negative for chest pain, palpitations and leg swelling.  Gastrointestinal: Negative.  Negative for abdominal pain, constipation, diarrhea, nausea and vomiting.  Endocrine: Negative.   Genitourinary: Negative.  Negative for dysuria, pelvic pain, vaginal  bleeding and vaginal discharge.  Musculoskeletal: Negative.   Skin: Negative.   Allergic/Immunologic: Negative.   Neurological: Positive for headaches. Negative for dizziness, weakness, light-headedness and numbness.  Hematological: Negative.   Psychiatric/Behavioral: Negative.    Physical Exam   Blood pressure 123/70, pulse (!) 54, temperature 98.7 F (37.1 C), temperature source Oral, SpO2 100 %, not currently breastfeeding.  Physical Exam  Nursing note and vitals reviewed. Constitutional: She is oriented to person, place, and time. She appears well-developed and well-nourished. No distress.  HENT:  Head: Normocephalic.  Eyes: Pupils are equal, round, and reactive to light. Conjunctivae and EOM are normal.  Neck: Normal range of motion.   Cardiovascular: Normal rate, regular rhythm and normal heart sounds.  No murmur heard. Respiratory: Effort normal and breath sounds normal. No respiratory distress.  GI: Soft. Bowel sounds are normal. She exhibits no distension. There is no abdominal tenderness.  Musculoskeletal: Normal range of motion.  Neurological: She is alert and oriented to person, place, and time. She has normal reflexes. No cranial nerve deficit. Coordination normal.  Skin: Skin is warm and dry. She is not diaphoretic.  Psychiatric: She has a normal mood and affect. Her behavior is normal. Judgment and thought content normal.    MAU Course  Procedures  MDM UA, urine protein/creatinine CBC, CMP 1000mg  Tylenol (with relief)  Results for orders placed or performed during the hospital encounter of 02/13/19 (from the past 24 hour(s))  Urinalysis, Routine w reflex microscopic     Status: Abnormal   Collection Time: 02/13/19  2:30 PM  Result Value Ref Range   Color, Urine YELLOW YELLOW   APPearance CLEAR CLEAR   Specific Gravity, Urine 1.024 1.005 - 1.030   pH 6.0 5.0 - 8.0   Glucose, UA NEGATIVE NEGATIVE mg/dL   Hgb urine dipstick SMALL (A) NEGATIVE   Bilirubin Urine NEGATIVE NEGATIVE   Ketones, ur NEGATIVE NEGATIVE mg/dL   Protein, ur NEGATIVE NEGATIVE mg/dL   Nitrite NEGATIVE NEGATIVE   Leukocytes,Ua TRACE (A) NEGATIVE   RBC / HPF 6-10 0 - 5 RBC/hpf   WBC, UA 6-10 0 - 5 WBC/hpf   Bacteria, UA NONE SEEN NONE SEEN   Squamous Epithelial / LPF 0-5 0 - 5   Mucus PRESENT   Protein / creatinine ratio, urine     Status: None   Collection Time: 02/13/19  2:40 PM  Result Value Ref Range   Creatinine, Urine 234.76 mg/dL   Total Protein, Urine 17 mg/dL   Protein Creatinine Ratio 0.07 0.00 - 0.15 mg/mg[Cre]  CBC with Differential/Platelet     Status: None   Collection Time: 02/13/19  2:46 PM  Result Value Ref Range   WBC 5.7 4.0 - 10.5 K/uL   RBC 4.23 3.87 - 5.11 MIL/uL   Hemoglobin 12.4 12.0 - 15.0 g/dL    HCT 16.3 84.6 - 65.9 %   MCV 91.0 80.0 - 100.0 fL   MCH 29.3 26.0 - 34.0 pg   MCHC 32.2 30.0 - 36.0 g/dL   RDW 93.5 70.1 - 77.9 %   Platelets 328 150 - 400 K/uL   nRBC 0.0 0.0 - 0.2 %   Neutrophils Relative % 60 %   Neutro Abs 3.4 1.7 - 7.7 K/uL   Lymphocytes Relative 28 %   Lymphs Abs 1.6 0.7 - 4.0 K/uL   Monocytes Relative 9 %   Monocytes Absolute 0.5 0.1 - 1.0 K/uL   Eosinophils Relative 2 %   Eosinophils Absolute 0.1 0.0 - 0.5 K/uL  Basophils Relative 1 %   Basophils Absolute 0.0 0.0 - 0.1 K/uL   Immature Granulocytes 0 %   Abs Immature Granulocytes 0.01 0.00 - 0.07 K/uL  Comprehensive metabolic panel     Status: Abnormal   Collection Time: 02/13/19  2:46 PM  Result Value Ref Range   Sodium 138 135 - 145 mmol/L   Potassium 3.9 3.5 - 5.1 mmol/L   Chloride 107 98 - 111 mmol/L   CO2 23 22 - 32 mmol/L   Glucose, Bld 86 70 - 99 mg/dL   BUN 12 6 - 20 mg/dL   Creatinine, Ser 4.33 0.44 - 1.00 mg/dL   Calcium 9.2 8.9 - 29.5 mg/dL   Total Protein 7.2 6.5 - 8.1 g/dL   Albumin 3.4 (L) 3.5 - 5.0 g/dL   AST 23 15 - 41 U/L   ALT 21 0 - 44 U/L   Alkaline Phosphatase 80 38 - 126 U/L   Total Bilirubin 0.8 0.3 - 1.2 mg/dL   GFR calc non Af Amer >60 >60 mL/min   GFR calc Af Amer >60 >60 mL/min   Anion gap 8 5 - 15     Assessment and Plan   1. Hypertension affecting pregnancy, unspecified trimester   2. Postpartum care and examination    DC home Comfort measures reviewed  Pre-eclampsia warning signs  RX: ibuprofen 800mg  PRN headaches  Return to MAU as needed FU with OB as planned  Follow-up Information    [facility] Follow up.   Contact information: [address] [place] [address] [phone]           Bernerd Limbo, SNM 02/13/2019, 3:45 PM   I confirm that I have verified the information documented in the nurse midwife student's note and that I have also personally reperformed the history, physical exam and all  medical decision making activities of this service and have verified that all service and findings are accurately documented in this student's note.   Patient given tylenol here in MAU and reports that headache has resolved. She states that when she has a headache at home usually ibuprofen helps, but she did not take any today. All labs normal, headache resolved, BP normal here. Patient advised to continue norvasc as prescribed and keep all FU visits with the office.   Thressa Sheller DNP, CNM  02/13/19  4:12 PM

## 2019-02-13 NOTE — Progress Notes (Signed)
Subjective:  Tiffany Reid is a 43 y.o. female here for BP check.   Hypertension ROS: taking medications as instructed, no medication side effects noted, no TIA's, no chest pain on exertion, no dyspnea on exertion, no swelling of ankles and positive for HA and visual changes..    Objective:  BP 132/90   Pulse 81   Wt 218 lb (98.9 kg)   LMP 05/09/2018 (Exact Date)   BMI 32.19 kg/m    Appearance alert, well appearing, and in no distress. General exam BP noted to be well controlled today in office.    Assessment:   Blood Pressure stable.   Plan:  Pt to continue medication as prescribed. Pt to report to MAU at [facility] for eval today.  Orders per Dr Alysia Penna today. Pt agrees to plan.

## 2019-02-23 ENCOUNTER — Ambulatory Visit (INDEPENDENT_AMBULATORY_CARE_PROVIDER_SITE_OTHER): Payer: Medicaid Other | Admitting: Obstetrics

## 2019-02-23 ENCOUNTER — Encounter: Payer: Self-pay | Admitting: Obstetrics

## 2019-02-23 ENCOUNTER — Other Ambulatory Visit: Payer: Self-pay

## 2019-02-23 VITALS — BP 127/88 | HR 73 | Wt 221.0 lb

## 2019-02-23 DIAGNOSIS — O165 Unspecified maternal hypertension, complicating the puerperium: Secondary | ICD-10-CM

## 2019-02-23 DIAGNOSIS — O169 Unspecified maternal hypertension, unspecified trimester: Secondary | ICD-10-CM

## 2019-02-23 DIAGNOSIS — Z1389 Encounter for screening for other disorder: Secondary | ICD-10-CM | POA: Diagnosis not present

## 2019-02-23 DIAGNOSIS — Z8632 Personal history of gestational diabetes: Secondary | ICD-10-CM

## 2019-02-23 NOTE — Progress Notes (Signed)
Post Partum Exam  Tiffany Reid is a 43 y.o. [phone] female who presents for a postpartum visit. She is 4 weeks postpartum following a spontaneous vaginal delivery. I have fully reviewed the prenatal and intrapartum course. The delivery was at 37 gestational weeks.  Anesthesia: none. Postpartum course has been complicated by increase BP. Baby's course has been doing well. Baby is feeding by bottle - Carn[employer] Good Start. Bleeding thin lochia. Bowel function is normal. Bladder function is normal. Patient is not sexually active. Contraception method is abstinence.  Postpartum depression screening:neg, score 0.  The following portions of the patient's history were reviewed and updated as appropriate: allergies, current medications, past family history, past medical history, past social history, past surgical history and problem list. Last pap smear done 09/2018 and was Normal  Review of Systems A comprehensive review of systems was negative.    Objective:  not currently breastfeeding.  General:  alert and no distress   Breasts:  inspection negative, no nipple discharge or bleeding, no masses or nodularity palpable  Lungs: clear to auscultation bilaterally  Heart:  regular rate and rhythm, S1, S2 normal, no murmur, click, rub or gallop  Abdomen: soft, non-tender; bowel sounds normal; no masses,  no organomegaly   Assessment:    1. Hypertension affecting pregnancy, unspecified trimester  2. Postpartum hypertension - stable  3. History of gestational diabetes mellitus (GDM) Rx: - Glucose Tolerance, 2 Hours w/1 Hour in 2 weeks  Plan:   1. Contraception: condoms 2. Continue PNV's 3. Follow up in: 2 weeks or as needed.    Brock Bad MD 02-23-2019

## 2019-03-09 ENCOUNTER — Other Ambulatory Visit: Payer: Self-pay

## 2019-03-09 ENCOUNTER — Ambulatory Visit (INDEPENDENT_AMBULATORY_CARE_PROVIDER_SITE_OTHER): Payer: Medicaid Other | Admitting: Advanced Practice Midwife

## 2019-03-09 ENCOUNTER — Other Ambulatory Visit: Payer: Medicaid Other

## 2019-03-09 ENCOUNTER — Ambulatory Visit: Payer: Medicaid Other | Admitting: Obstetrics

## 2019-03-09 DIAGNOSIS — O165 Unspecified maternal hypertension, complicating the puerperium: Secondary | ICD-10-CM

## 2019-03-09 DIAGNOSIS — O24414 Gestational diabetes mellitus in pregnancy, insulin controlled: Secondary | ICD-10-CM

## 2019-03-09 MED ORDER — ENALAPRIL MALEATE 10 MG PO TABS: 10.0000 mg | ORAL_TABLET | Freq: Every day | ORAL | 4 refills | Status: DC

## 2019-03-09 NOTE — Progress Notes (Signed)
Subjective:  Tiffany Reid is a 43 y.o. female here for BP check and PP 2GTT.   Hypertension ROS: taking medications as instructed, no medication side effects noted, no TIA's, no chest pain on exertion, no dyspnea on exertion and no swelling of ankles.    Objective:  There were no vitals taken for this visit.  Appearance alert, well appearing, and in no distress. General exam BP noted to be well controlled today in office.    Assessment:   Blood Pressure stable.   Plan:  Current treatment plan is effective, no change in therapy.  Pt to follow up with PCP for BP management. Refills on Enalapril sent to pharmacy today. 2GTT done today, pt advised to call for results.

## 2019-03-09 NOTE — Progress Notes (Signed)
I reviewed this pt chart and VS and agree with the nurse's note.  Pt to continue enalapril as prescribed and f/u with primary care as soon as possible. Refills sent.

## 2019-03-12 LAB — GESTATIONAL GLUCOSE (X3)
Glucose, GTT - 1 Hour: 120 mg/dL (ref 65–179)
Glucose, GTT - 2 Hour: 111 mg/dL (ref 65–154)

## 2019-03-22 ENCOUNTER — Encounter: Payer: Self-pay | Admitting: Advanced Practice Midwife

## 2019-03-22 DIAGNOSIS — Z8632 Personal history of gestational diabetes: Secondary | ICD-10-CM | POA: Insufficient documentation

## 2019-03-22 HISTORY — DX: Personal history of gestational diabetes: Z86.32

## 2019-06-15 ENCOUNTER — Ambulatory Visit (INDEPENDENT_AMBULATORY_CARE_PROVIDER_SITE_OTHER): Payer: Medicaid Other

## 2019-06-15 ENCOUNTER — Other Ambulatory Visit: Payer: Self-pay

## 2019-06-15 VITALS — BP 122/79 | HR 73 | Ht 69.0 in | Wt 230.0 lb

## 2019-06-15 DIAGNOSIS — Z3201 Encounter for pregnancy test, result positive: Secondary | ICD-10-CM | POA: Diagnosis not present

## 2019-06-15 DIAGNOSIS — Z3480 Encounter for supervision of other normal pregnancy, unspecified trimester: Secondary | ICD-10-CM

## 2019-06-15 DIAGNOSIS — O0990 Supervision of high risk pregnancy, unspecified, unspecified trimester: Secondary | ICD-10-CM | POA: Insufficient documentation

## 2019-06-15 LAB — POCT URINE PREGNANCY: Preg Test, Ur: POSITIVE — AB

## 2019-06-15 NOTE — Progress Notes (Signed)
[name] presents today for UPT. She has no unusual complaints and complains of right knee and hip pain 4-6/10.  LMP: 04/27/2019    OBJECTIVE: Appears well, in no apparent distress.  OB History    Gravida  8   Para  3   Term  3   Preterm      AB  3   Living  3     SAB  3   TAB      Ectopic      Multiple  0   Live Births  3          Home UPT Result: POSITIVE In-Office UPT result: POSITIVE  I have reviewed the patient's medical, obstetrical, social, and family histories, and medications.   ASSESSMENT: Positive pregnancy test  PLAN Prenatal care to be completed at: [facility]

## 2019-07-15 ENCOUNTER — Other Ambulatory Visit (HOSPITAL_COMMUNITY)
Admission: RE | Admit: 2019-07-15 | Discharge: 2019-07-15 | Disposition: A | Discharge status: Home / Self Care | Payer: Medicaid Other | Source: Ambulatory Visit | Attending: Obstetrics and Gynecology | Admitting: Obstetrics and Gynecology

## 2019-07-15 ENCOUNTER — Ambulatory Visit (INDEPENDENT_AMBULATORY_CARE_PROVIDER_SITE_OTHER): Payer: Medicaid Other | Admitting: Obstetrics and Gynecology

## 2019-07-15 ENCOUNTER — Encounter: Payer: Self-pay | Admitting: Obstetrics and Gynecology

## 2019-07-15 ENCOUNTER — Other Ambulatory Visit: Payer: Self-pay

## 2019-07-15 VITALS — BP 124/75 | HR 82 | Wt 232.0 lb

## 2019-07-15 DIAGNOSIS — O09521 Supervision of elderly multigravida, first trimester: Secondary | ICD-10-CM | POA: Diagnosis not present

## 2019-07-15 DIAGNOSIS — O0990 Supervision of high risk pregnancy, unspecified, unspecified trimester: Secondary | ICD-10-CM | POA: Insufficient documentation

## 2019-07-15 DIAGNOSIS — Z3A11 11 weeks gestation of pregnancy: Secondary | ICD-10-CM

## 2019-07-15 DIAGNOSIS — O0991 Supervision of high risk pregnancy, unspecified, first trimester: Secondary | ICD-10-CM

## 2019-07-15 DIAGNOSIS — O09291 Supervision of pregnancy with other poor reproductive or obstetric history, first trimester: Secondary | ICD-10-CM

## 2019-07-15 DIAGNOSIS — Z8632 Personal history of gestational diabetes: Secondary | ICD-10-CM | POA: Diagnosis not present

## 2019-07-15 DIAGNOSIS — O09299 Supervision of pregnancy with other poor reproductive or obstetric history, unspecified trimester: Secondary | ICD-10-CM | POA: Insufficient documentation

## 2019-07-15 MED ORDER — ASPIRIN EC 81 MG PO TBEC: 81.0000 mg | DELAYED_RELEASE_TABLET | Freq: Every day | ORAL | 2 refills | Status: DC

## 2019-07-15 MED ORDER — BLOOD PRESSURE CUFF MISC: 1.0000 | Freq: Once | 0 refills | Status: DC

## 2019-07-15 NOTE — Progress Notes (Signed)
Pt is here for new OB visit. [redacted]w[redacted]d.

## 2019-07-15 NOTE — Patient Instructions (Signed)
First Trimester of Pregnancy The first trimester of pregnancy is from week 1 until the end of week 13 (months 1 through 3). A week after a sperm fertilizes an egg, the egg will implant on the wall of the uterus. This embryo will begin to develop into a baby. Genes from you and your partner will form the baby. The female genes will determine whether the baby will be a boy or a girl. At 6-8 weeks, the eyes and face will be formed, and the heartbeat can be seen on ultrasound. At the end of 12 weeks, all the baby's organs will be formed. Now that you are pregnant, you will want to do everything you can to have a healthy baby. Two of the most important things are to get good prenatal care and to follow your health care provider's instructions. Prenatal care is all the medical care you receive before the baby's birth. This care will help prevent, find, and treat any problems during the pregnancy and childbirth. Body changes during your first trimester Your body goes through many changes during pregnancy. The changes vary from woman to woman.  You may gain or lose a couple of pounds at first.  You may feel sick to your stomach (nauseous) and you may throw up (vomit). If the vomiting is uncontrollable, call your health care provider.  You may tire easily.  You may develop headaches that can be relieved by medicines. All medicines should be approved by your health care provider.  You may urinate more often. Painful urination may mean you have a bladder infection.  You may develop heartburn as a result of your pregnancy.  You may develop constipation because certain hormones are causing the muscles that push stool through your intestines to slow down.  You may develop hemorrhoids or swollen veins (varicose veins).  Your breasts may begin to grow larger and become tender. Your nipples may stick out more, and the tissue that surrounds them (areola) may become darker.  Your gums may bleed and may be  sensitive to brushing and flossing.  Dark spots or blotches (chloasma, mask of pregnancy) may develop on your face. This will likely fade after the baby is born.  Your menstrual periods will stop.  You may have a loss of appetite.  You may develop cravings for certain kinds of food.  You may have changes in your emotions from day to day, such as being excited to be pregnant or being concerned that something may go wrong with the pregnancy and baby.  You may have more vivid and strange dreams.  You may have changes in your hair. These can include thickening of your hair, rapid growth, and changes in texture. Some women also have hair loss during or after pregnancy, or hair that feels dry or thin. Your hair will most likely return to normal after your baby is born. What to expect at prenatal visits During a routine prenatal visit:  You will be weighed to make sure you and the baby are growing normally.  Your blood pressure will be taken.  Your abdomen will be measured to track your baby's growth.  The fetal heartbeat will be listened to between weeks 10 and 14 of your pregnancy.  Test results from any previous visits will be discussed. Your health care provider may ask you:  How you are feeling.  If you are feeling the baby move.  If you have had any abnormal symptoms, such as leaking fluid, bleeding, severe headaches, or abdominal   cramping.  If you are using any tobacco products, including cigarettes, chewing tobacco, and electronic cigarettes.  If you have any questions. Other tests that may be performed during your first trimester include:  Blood tests to find your blood type and to check for the presence of any previous infections. The tests will also be used to check for low iron levels (anemia) and protein on red blood cells (Rh antibodies). Depending on your risk factors, or if you previously had diabetes during pregnancy, you may have tests to check for high blood sugar  that affects pregnant women (gestational diabetes).  Urine tests to check for infections, diabetes, or protein in the urine.  An ultrasound to confirm the proper growth and development of the baby.  Fetal screens for spinal cord problems (spina bifida) and Down syndrome.  HIV (human immunodeficiency virus) testing. Routine prenatal testing includes screening for HIV, unless you choose not to have this test.  You may need other tests to make sure you and the baby are doing well. Follow these instructions at home: Medicines  Follow your health care provider's instructions regarding medicine use. Specific medicines may be either safe or unsafe to take during pregnancy.  Take a prenatal vitamin that contains at least 600 micrograms (mcg) of folic acid.  If you develop constipation, try taking a stool softener if your health care provider approves. Eating and drinking   Eat a balanced diet that includes fresh fruits and vegetables, whole grains, good sources of protein such as meat, eggs, or tofu, and low-fat dairy. Your health care provider will help you determine the amount of weight gain that is right for you.  Avoid raw meat and uncooked cheese. These carry germs that can cause birth defects in the baby.  Eating four or five small meals rather than three large meals a day may help relieve nausea and vomiting. If you start to feel nauseous, eating a few soda crackers can be helpful. Drinking liquids between meals, instead of during meals, also seems to help ease nausea and vomiting.  Limit foods that are high in fat and processed sugars, such as fried and sweet foods.  To prevent constipation: ? Eat foods that are high in fiber, such as fresh fruits and vegetables, whole grains, and beans. ? Drink enough fluid to keep your urine clear or pale yellow. Activity  Exercise only as directed by your health care provider. Most women can continue their usual exercise routine during  pregnancy. Try to exercise for 30 minutes at least 5 days a week. Exercising will help you: ? Control your weight. ? Stay in shape. ? Be prepared for labor and delivery.  Experiencing pain or cramping in the lower abdomen or lower back is a good sign that you should stop exercising. Check with your health care provider before continuing with normal exercises.  Try to avoid standing for long periods of time. Move your legs often if you must stand in one place for a long time.  Avoid heavy lifting.  Wear low-heeled shoes and practice good posture.  You may continue to have sex unless your health care provider tells you not to. Relieving pain and discomfort  Wear a good support bra to relieve breast tenderness.  Take warm sitz baths to soothe any pain or discomfort caused by hemorrhoids. Use hemorrhoid cream if your health care provider approves.  Rest with your legs elevated if you have leg cramps or low back pain.  If you develop varicose veins in   your legs, wear support hose. Elevate your feet for 15 minutes, 3-4 times a day. Limit salt in your diet. Prenatal care  Schedule your prenatal visits by the twelfth week of pregnancy. They are usually scheduled monthly at first, then more often in the last 2 months before delivery.  Write down your questions. Take them to your prenatal visits.  Keep all your prenatal visits as told by your health care provider. This is important. Safety  Wear your seat belt at all times when driving.  Make a list of emergency phone numbers, including numbers for family, friends, the hospital, and police and fire departments. General instructions  Ask your health care provider for a referral to a local prenatal education class. Begin classes no later than the beginning of month 6 of your pregnancy.  Ask for help if you have counseling or nutritional needs during pregnancy. Your health care provider can offer advice or refer you to specialists for help  with various needs.  Do not use hot tubs, steam rooms, or saunas.  Do not douche or use tampons or scented sanitary pads.  Do not cross your legs for long periods of time.  Avoid cat litter boxes and soil used by cats. These carry germs that can cause birth defects in the baby and possibly loss of the fetus by miscarriage or stillbirth.  Avoid all smoking, herbs, alcohol, and medicines not prescribed by your health care provider. Chemicals in these products affect the formation and growth of the baby.  Do not use any products that contain nicotine or tobacco, such as cigarettes and e-cigarettes. If you need help quitting, ask your health care provider. You may receive counseling support and other resources to help you quit.  Schedule a dentist appointment. At home, brush your teeth with a soft toothbrush and be gentle when you floss. Contact a health care provider if:  You have dizziness.  You have mild pelvic cramps, pelvic pressure, or nagging pain in the abdominal area.  You have persistent nausea, vomiting, or diarrhea.  You have a bad smelling vaginal discharge.  You have pain when you urinate.  You notice increased swelling in your face, hands, legs, or ankles.  You are exposed to fifth disease or chickenpox.  You are exposed to German measles (rubella) and have never had it. Get help right away if:  You have a fever.  You are leaking fluid from your vagina.  You have spotting or bleeding from your vagina.  You have severe abdominal cramping or pain.  You have rapid weight gain or loss.  You vomit blood or material that looks like coffee grounds.  You develop a severe headache.  You have shortness of breath.  You have any kind of trauma, such as from a fall or a car accident. Summary  The first trimester of pregnancy is from week 1 until the end of week 13 (months 1 through 3).  Your body goes through many changes during pregnancy. The changes vary from  woman to woman.  You will have routine prenatal visits. During those visits, your health care provider will examine you, discuss any test results you may have, and talk with you about how you are feeling. This information is not intended to replace advice given to you by your health care provider. Make sure you discuss any questions you have with your health care provider. Document Released: 11/13/2001 Document Revised: 11/01/2017 Document Reviewed: 10/31/2016 Elsevier Patient Education  2020 [employer]  

## 2019-07-15 NOTE — Progress Notes (Signed)
Subjective:  Tiffany Reid is a 43 y.o. [id number] at 54w2dbeing seen today for first OB visit. EDD by LMP. Just delivered this Feb with GDM, insulin dependent and SPEC with IOL at 37 weeks.  She is currently monitored for the following issues for this high-risk pregnancy and has AMA (advanced maternal age) multigravida 385+ History of gestational diabetes; Supervision of high risk pregnancy, antepartum; and History of pre-eclampsia in prior pregnancy, currently pregnant on their problem list.  Patient reports no complaints.  Contractions: Not present. Vag. Bleeding: Scant.   . Denies leaking of fluid.   The following portions of the patient's history were reviewed and updated as appropriate: allergies, current medications, past family history, past medical history, past social history, past surgical history and problem list. Problem list updated.  Objective:   Vitals:   07/15/19 1356  BP: 124/75  Pulse: 82  Weight: 232 lb (105.2 kg)    Fetal Status:           General:  Alert, oriented and cooperative. Patient is in no acute distress.  Skin: Skin is warm and dry. No rash noted.   Cardiovascular: Normal heart rate noted  Respiratory: Normal respiratory effort, no problems with respiration noted  Abdomen: Soft, gravid, appropriate for gestational age. Pain/Pressure: Absent     Pelvic:  Cervical exam deferred        Extremities: Normal range of motion.  Edema: None  Mental Status: Normal mood and affect. Normal behavior. Normal judgment and thought content.   Urinalysis:      Assessment and Plan:  Pregnancy: [id number] 13w2d1. Supervision of high risk pregnancy, antepartum Prenatal care and labs reviewed with pt. Will obtain U/S for dating. Genetic testing reviewed with pt Baby Rx ordered - Babyscripts Schedule Optimization - Blood Pressure Monitoring (BLOOD PRESSURE CUFF) MISC; 1 Device by Does not apply route once for 1 dose.  Dispense: 1 each; Refill: 0 - Urine cytology  ancillary only([place]) - Genetic Screening - Obstetric Panel, Including HIV - Culture, OB Urine - Hemoglobin A1c - Protein / creatinine ratio, urine - Comp Met (CMET) - TSH - [place] LESS THAN 14 WEEKS WITH OB TRANSVAGINAL; Future  2. History of gestational diabetes Will check hgb A1c  3. Multigravida of advanced maternal age in first trimester As noted above  4. History of pre-eclampsia in prior pregnancy, currently pregnant BP monitoring reviewed with pt Will start BASA qd Indications reviewed with pt  Preterm labor symptoms and general obstetric precautions including but not limited to vaginal bleeding, contractions, leaking of fluid and fetal movement were reviewed in detail with the patient. Please refer to After Visit Summary for other counseling recommendations.  Return in about 4 weeks (around 08/12/2019) for OB visit, face to face.   ErChancy MilroyMD

## 2019-07-16 LAB — PROTEIN / CREATININE RATIO, URINE
Creatinine, Urine: 165.6 mg/dL
Protein, Ur: 16.9 mg/dL
Protein/Creat Ratio: 102 mg/g creat (ref 0–200)

## 2019-07-17 LAB — OBSTETRIC PANEL, INCLUDING HIV
Antibody Screen: NEGATIVE
Basophils Absolute: 0 10*3/uL (ref 0.0–0.2)
Basos: 0 %
EOS (ABSOLUTE): 0.2 10*3/uL (ref 0.0–0.4)
Eos: 2 %
HIV Screen 4th Generation wRfx: NONREACTIVE
Hematocrit: 35.8 % (ref 34.0–46.6)
Hemoglobin: 12.1 g/dL (ref 11.1–15.9)
Hepatitis B Surface Ag: NEGATIVE
Immature Grans (Abs): 0 10*3/uL (ref 0.0–0.1)
Immature Granulocytes: 0 %
Lymphocytes Absolute: 2 10*3/uL (ref 0.7–3.1)
Lymphs: 21 %
MCH: 30 pg (ref 26.6–33.0)
MCHC: 33.8 g/dL (ref 31.5–35.7)
MCV: 89 fL (ref 79–97)
Monocytes Absolute: 1 10*3/uL — ABNORMAL HIGH (ref 0.1–0.9)
Monocytes: 10 %
Neutrophils Absolute: 6.6 10*3/uL (ref 1.4–7.0)
Neutrophils: 67 %
Platelets: 341 10*3/uL (ref 150–450)
RBC: 4.04 x10E6/uL (ref 3.77–5.28)
RDW: 12.9 % (ref 11.7–15.4)
RPR Ser Ql: NONREACTIVE
Rh Factor: POSITIVE
Rubella Antibodies, IGG: 1.21 index (ref >0.99)
WBC: 9.9 10*3/uL (ref 3.4–10.8)

## 2019-07-17 LAB — COMPREHENSIVE METABOLIC PANEL
ALT: 13 IU/L (ref 0–32)
AST: 14 IU/L (ref 0–40)
Albumin/Globulin Ratio: 1.2 (ref 1.2–2.2)
Albumin: 4.1 g/dL (ref 3.8–4.8)
Alkaline Phosphatase: 57 IU/L (ref 39–117)
BUN/Creatinine Ratio: 12 (ref 9–23)
BUN: 9 mg/dL (ref 6–24)
Bilirubin Total: 0.3 mg/dL (ref 0.0–1.2)
CO2: 22 mmol/L (ref 20–29)
Calcium: 9.6 mg/dL (ref 8.7–10.2)
Chloride: 98 mmol/L (ref 96–106)
Creatinine, Ser: 0.74 mg/dL (ref 0.57–1.00)
GFR calc Af Amer: 116 mL/min/{1.73_m2} (ref >59)
GFR calc non Af Amer: 101 mL/min/{1.73_m2} (ref >59)
Globulin, Total: 3.3 g/dL (ref 1.5–4.5)
Glucose: 70 mg/dL (ref 65–99)
Potassium: 3.8 mmol/L (ref 3.5–5.2)
Sodium: 135 mmol/L (ref 134–144)
Total Protein: 7.4 g/dL (ref 6.0–8.5)

## 2019-07-17 LAB — URINE CYTOLOGY ANCILLARY ONLY
Chlamydia: NEGATIVE
Neisseria Gonorrhea: NEGATIVE

## 2019-07-17 LAB — HEMOGLOBIN A1C
Est. average glucose Bld gHb Est-mCnc: 111 mg/dL
Hgb A1c MFr Bld: 5.5 % (ref 4.8–5.6)

## 2019-07-17 LAB — TSH: TSH: 1.62 u[IU]/mL (ref 0.450–4.500)

## 2019-07-19 LAB — URINE CULTURE, OB REFLEX

## 2019-07-19 LAB — CULTURE, OB URINE

## 2019-07-22 ENCOUNTER — Other Ambulatory Visit: Payer: Self-pay

## 2019-07-22 DIAGNOSIS — O2340 Unspecified infection of urinary tract in pregnancy, unspecified trimester: Secondary | ICD-10-CM

## 2019-07-22 MED ORDER — NITROFURANTOIN MONOHYD MACRO 100 MG PO CAPS: 100.0000 mg | ORAL_CAPSULE | Freq: Two times a day (BID) | ORAL | 0 refills | Status: DC

## 2019-07-22 NOTE — Progress Notes (Signed)
Rx changed to Macrobid 100 mg.  Per Dr.Ervin.

## 2019-07-23 ENCOUNTER — Ambulatory Visit (INDEPENDENT_AMBULATORY_CARE_PROVIDER_SITE_OTHER): Payer: Medicaid Other | Admitting: *Deleted

## 2019-07-23 ENCOUNTER — Other Ambulatory Visit: Payer: Self-pay

## 2019-07-23 ENCOUNTER — Other Ambulatory Visit: Payer: Self-pay | Admitting: Obstetrics and Gynecology

## 2019-07-23 ENCOUNTER — Ambulatory Visit (HOSPITAL_COMMUNITY)
Admission: RE | Admit: 2019-07-23 | Discharge: 2019-07-23 | Disposition: A | Discharge status: Home / Self Care | Payer: Medicaid Other | Source: Ambulatory Visit | Attending: Obstetrics and Gynecology | Admitting: Obstetrics and Gynecology

## 2019-07-23 DIAGNOSIS — O0990 Supervision of high risk pregnancy, unspecified, unspecified trimester: Secondary | ICD-10-CM | POA: Insufficient documentation

## 2019-07-23 DIAGNOSIS — Z712 Person consulting for explanation of examination or test findings: Secondary | ICD-10-CM

## 2019-07-23 NOTE — Progress Notes (Signed)
Pt presents for [place] results which were reviewed and reported to Dr. Rip Harbour. Pt was informed of normal, viable IUP with EDD 02/02/20. By sure LMP, EDD is calculated as 02/01/20. Pt was advised that BEDD is 02/01/20 per LMP. Pt voiced understanding and had no questions. She was given the pictures which were provided by [place] dept. She will continue prenatal care @ [facility] as scheduled.

## 2019-07-28 ENCOUNTER — Encounter: Payer: Self-pay | Admitting: Obstetrics and Gynecology

## 2019-08-03 ENCOUNTER — Telehealth: Payer: Self-pay | Admitting: *Deleted

## 2019-08-03 DIAGNOSIS — Z315 Encounter for genetic counseling: Secondary | ICD-10-CM

## 2019-08-03 NOTE — Telephone Encounter (Signed)
-----   Message from Tiffany Milroy, MD sent at 08/03/2019  9:37 AM EDT ----- Please refer pt for genetic counseling due to increased carrier risk for SMA. Thanks Legrand Como

## 2019-08-04 ENCOUNTER — Other Ambulatory Visit: Payer: Self-pay | Admitting: *Deleted

## 2019-08-04 DIAGNOSIS — O0990 Supervision of high risk pregnancy, unspecified, unspecified trimester: Secondary | ICD-10-CM

## 2019-08-04 NOTE — Progress Notes (Signed)
U/s ordered entered for anatomy scan

## 2019-08-06 ENCOUNTER — Encounter: Payer: Self-pay | Admitting: Obstetrics and Gynecology

## 2019-08-12 ENCOUNTER — Encounter: Payer: Self-pay | Admitting: Obstetrics and Gynecology

## 2019-08-12 ENCOUNTER — Other Ambulatory Visit: Payer: Self-pay

## 2019-08-12 ENCOUNTER — Ambulatory Visit (INDEPENDENT_AMBULATORY_CARE_PROVIDER_SITE_OTHER): Payer: Medicaid Other | Admitting: Obstetrics and Gynecology

## 2019-08-12 VITALS — BP 116/75 | HR 80 | Wt 236.1 lb

## 2019-08-12 DIAGNOSIS — O0990 Supervision of high risk pregnancy, unspecified, unspecified trimester: Secondary | ICD-10-CM

## 2019-08-12 DIAGNOSIS — O09299 Supervision of pregnancy with other poor reproductive or obstetric history, unspecified trimester: Secondary | ICD-10-CM

## 2019-08-12 DIAGNOSIS — Z23 Encounter for immunization: Secondary | ICD-10-CM | POA: Diagnosis not present

## 2019-08-12 DIAGNOSIS — Z8632 Personal history of gestational diabetes: Secondary | ICD-10-CM

## 2019-08-12 DIAGNOSIS — Z3A15 15 weeks gestation of pregnancy: Secondary | ICD-10-CM

## 2019-08-12 DIAGNOSIS — O09522 Supervision of elderly multigravida, second trimester: Secondary | ICD-10-CM

## 2019-08-12 MED ORDER — CYCLOBENZAPRINE HCL 10 MG PO TABS: 10.0000 mg | ORAL_TABLET | Freq: Three times a day (TID) | ORAL | 2 refills | Status: DC | PRN

## 2019-08-12 MED ORDER — BLOOD PRESSURE CUFF MISC: 1.0000 | Freq: Once | 0 refills | Status: AC

## 2019-08-12 MED ORDER — COMFORT FIT MATERNITY SUPP LG MISC: 0 refills | Status: DC

## 2019-08-12 NOTE — Progress Notes (Signed)
   PRENATAL VISIT NOTE  Subjective:  Tiffany Reid is a 43 y.o. [phone] at [redacted]w[redacted]d being seen today for ongoing prenatal care.  She is currently monitored for the following issues for this high-risk pregnancy and has AMA (advanced maternal age) multigravida 34+; History of gestational diabetes; Supervision of high risk pregnancy, antepartum; and History of pre-eclampsia in prior pregnancy, currently pregnant on their problem list.  Patient reports backache.  Contractions: Not present. Vag. Bleeding: None.  Movement: Absent. Denies leaking of fluid.   The following portions of the patient's history were reviewed and updated as appropriate: allergies, current medications, past family history, past medical history, past social history, past surgical history and problem list.   Objective:   Vitals:   08/12/19 0956  BP: 116/75  Pulse: 80  Weight: 236 lb 1.6 oz (107.1 kg)    Fetal Status: Fetal Heart Rate (bpm): 152   Movement: Absent     General:  Alert, oriented and cooperative. Patient is in no acute distress.  Skin: Skin is warm and dry. No rash noted.   Cardiovascular: Normal heart rate noted  Respiratory: Normal respiratory effort, no problems with respiration noted  Abdomen: Soft, gravid, appropriate for gestational age.  Pain/Pressure: Absent     Pelvic: Cervical exam deferred        Extremities: Normal range of motion.  Edema: Trace  Mental Status: Normal mood and affect. Normal behavior. Normal judgment and thought content.   Assessment and Plan:  Pregnancy: [id number] at [redacted]w[redacted]d 1. Supervision of high risk pregnancy, antepartum Patient is doing well complaining of lower back pain Discussed back exercises. Rx flexeril and maternity support belt provided AFP today Anatomy ultrasound ordered - AFP, Serum, Open Spina Bifida  2. History of pre-eclampsia in prior pregnancy, currently pregnant Normotensive  Continue ASA  3. Multigravida of advanced maternal age in second trimester   4. History of gestational diabetes Normal A1c  Preterm labor symptoms and general obstetric precautions including but not limited to vaginal bleeding, contractions, leaking of fluid and fetal movement were reviewed in detail with the patient. Please refer to After Visit Summary for other counseling recommendations.   No follow-ups on file.  Future Appointments  Date Time Provider [place]  09/01/2019  1:00 PM [place] [facility]  09/01/2019  1:00 PM [place] 3 [facility]  09/01/2019  2:30 PM [place] [facility]ET[facility]SEL[facility]    Mora Bellman, MD

## 2019-08-15 LAB — AFP, SERUM, OPEN SPINA BIFIDA
AFP MoM: 0.83
AFP Value: 20.3 ng/mL
Gest. Age on Collection Date: 15.2 weeks
Maternal Age At EDD: 42 yr
OSBR Risk 1 IN: 10000
Test Results:: NEGATIVE
Weight: 236 [lb_av]

## 2019-09-01 ENCOUNTER — Encounter (HOSPITAL_COMMUNITY): Payer: Self-pay

## 2019-09-01 ENCOUNTER — Other Ambulatory Visit: Payer: Self-pay

## 2019-09-01 ENCOUNTER — Ambulatory Visit (HOSPITAL_COMMUNITY): Payer: BC Managed Care – PPO | Admitting: *Deleted

## 2019-09-01 ENCOUNTER — Other Ambulatory Visit: Payer: Self-pay | Admitting: Obstetrics and Gynecology

## 2019-09-01 ENCOUNTER — Ambulatory Visit (HOSPITAL_BASED_OUTPATIENT_CLINIC_OR_DEPARTMENT_OTHER): Payer: BC Managed Care – PPO | Admitting: Genetic Counselor

## 2019-09-01 ENCOUNTER — Ambulatory Visit (HOSPITAL_COMMUNITY): Payer: Self-pay | Admitting: Genetic Counselor

## 2019-09-01 ENCOUNTER — Other Ambulatory Visit (HOSPITAL_COMMUNITY): Payer: Self-pay | Admitting: *Deleted

## 2019-09-01 ENCOUNTER — Other Ambulatory Visit (HOSPITAL_COMMUNITY): Payer: Medicaid Other

## 2019-09-01 ENCOUNTER — Ambulatory Visit (HOSPITAL_COMMUNITY)
Admission: RE | Admit: 2019-09-01 | Discharge: 2019-09-01 | Disposition: A | Discharge status: Home / Self Care | Payer: BC Managed Care – PPO | Source: Ambulatory Visit | Attending: Obstetrics and Gynecology | Admitting: Obstetrics and Gynecology

## 2019-09-01 ENCOUNTER — Ambulatory Visit (HOSPITAL_COMMUNITY): Payer: BC Managed Care – PPO

## 2019-09-01 VITALS — BP 110/65 | HR 93 | Temp 99.2°F

## 2019-09-01 DIAGNOSIS — O09293 Supervision of pregnancy with other poor reproductive or obstetric history, third trimester: Secondary | ICD-10-CM | POA: Diagnosis not present

## 2019-09-01 DIAGNOSIS — Z148 Genetic carrier of other disease: Secondary | ICD-10-CM

## 2019-09-01 DIAGNOSIS — Z315 Encounter for genetic counseling: Secondary | ICD-10-CM

## 2019-09-01 DIAGNOSIS — Z31430 Encounter of female for testing for genetic disease carrier status for procreative management: Secondary | ICD-10-CM | POA: Diagnosis present

## 2019-09-01 DIAGNOSIS — O0992 Supervision of high risk pregnancy, unspecified, second trimester: Secondary | ICD-10-CM | POA: Insufficient documentation

## 2019-09-01 DIAGNOSIS — O09292 Supervision of pregnancy with other poor reproductive or obstetric history, second trimester: Secondary | ICD-10-CM

## 2019-09-01 DIAGNOSIS — O10012 Pre-existing essential hypertension complicating pregnancy, second trimester: Secondary | ICD-10-CM

## 2019-09-01 DIAGNOSIS — O0990 Supervision of high risk pregnancy, unspecified, unspecified trimester: Secondary | ICD-10-CM | POA: Diagnosis present

## 2019-09-01 DIAGNOSIS — Z363 Encounter for antenatal screening for malformations: Secondary | ICD-10-CM | POA: Diagnosis not present

## 2019-09-01 DIAGNOSIS — O09522 Supervision of elderly multigravida, second trimester: Secondary | ICD-10-CM | POA: Insufficient documentation

## 2019-09-01 DIAGNOSIS — O10013 Pre-existing essential hypertension complicating pregnancy, third trimester: Secondary | ICD-10-CM

## 2019-09-01 DIAGNOSIS — Z3A18 18 weeks gestation of pregnancy: Secondary | ICD-10-CM

## 2019-09-01 DIAGNOSIS — Z362 Encounter for other antenatal screening follow-up: Secondary | ICD-10-CM

## 2019-09-01 NOTE — Progress Notes (Signed)
09/01/2019  Tiffany Reid 09/01/1976 MRN: [id number] DOV: 09/01/2019  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 [name] came to her appointment alone due to COVID-19 visitor restrictions.   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 is a [id number], 43 y.o. female. Her current pregnancy has completed [redacted]w[redacted]d(Estimated Date of Delivery: 02/01/20).  [name] exposure to environmental toxins or chemical agents. She denied the use of alcohol, tobacco or street drugs. She reported taking prenatal vitamins, aspirin, a muscle relaxant, and Valtrex. She denied significant viral illnesses, fevers, and bleeding during the course of her pregnancy. Her medical and surgical histories were noncontributory.  Family History  A three generation pedigree was drafted and reviewed. The family history is remarkable for the following:  - Ms. Tiffany Glazierhas had three spontaneous pregnancy losses, all occurring in the first trimester. The cause of these miscarriages is unknown. We reviewed that there are many potential causes of recurrent miscarriages, including anatomic, immunologic, endocrine, and genetic factors. However, a cause is only identified in 50% of individuals who experience recurrent miscarriages. Abnormalities of chromosome number or structure are the most common cause of sporadic early pregnancy loss. A significant proportion of recurrent miscarriages may also be associated with structural or numerical chromosome abnormalities. We discussed that 2-5% of couples who experience multiple miscarriages carry a balanced translocation that can become unbalanced and potentially lethal in their offspring. In addition to chromosomal abnormalities, certain single gene, X-linked, and polygenic/multifactorial  disorders are associated with recurrent miscarriage. See Discussion section for more details.  - Ms. Tiffany Glazierhas a daughter from a previous relationship with premature ventricular contractions and another daughter with a heart murmur. The exact etiology of her daughter's murmur is unknown and records are not available. There are multifactorial causes for isolated congenital heart defects (CHDs) and arrhythmia disorders, including environmental and genetic factors. As such, the recurrence risk of CHDs for second degree relatives is elevated over the general population incidence of 1/200 (0.5%). We discussed that without knowing the exact etiology, half-siblings of individuals with a CHD have a 1% risk of having a CHD themselves. Additionally, the risk of recurrence for arrhythmias could be as high as 50%.   - Tiffany Reid's older daughters both have learning difficulties due to dyslexia. One of them also has ADHD. We discussed that many times, learning difficulties are multifactorial in nature, occurring due to a combination of genetic and environmental factors that are difficult to identify. Learning disabilities can appear to run in families; thus, there is a chance that the current fetus could also experience learning difficulties of some kind. Ms. Tiffany Glazierunderstands that she should make the pediatrician aware of any concerns she has about her children's development.  - [name], Tiffany Reid passed away in her late 89sor early 89sdue to breast cancer that became metastatic. Ms. Tiffany Glazierbelieves Tiffany Reid was diagnosed at least five years prior to her death. Though most cancers are thought to be sporadic or due to environmental factors, some families appear to have a strong predisposition to cancers.  When considering a family history of cancer, we look for common types of cancer in multiple family members occurring at younger than typical ages. we discussed the option of meeting  with a cancer genetic counselor to discuss any  possible screening or testing options available. If Tiffany Reid is concerned about the family history of cancer and would like to learn more about the family's chance for an inherited cancer syndrome, his doctor may refer him or his relatives to the [facility] [phone]).   The remaining family histories were reviewed and found to be noncontributory for birth defects, intellectual disability, recurrent pregnancy loss, and known genetic conditions. [name] had limited information about Tiffany Reid paternal family history; thus, risk assessment was limited.  The patient's ethnicity is [place]. The father of the pregnancy's ethnicity is African American. Ashkenazi Jewish ancestry and consanguinity were denied. Pedigree will be scanned under Media.  Discussion  [name] was found to have 2 copies of SMN1 on Horizon-14 carrier screening; however, she also has the c.*3+80T>G polymorphism of SMN1 in intron 7 (also known as [id number]). This puts her at increased risk (1 in 22) to be a silent 2+0 carrier for spinal muscular atrophy (SMA). SMA is a condition caused by mutations in the SMN1 gene. SMA is characterized by progressive muscle weakness and atrophy due to degeneration and loss of anterior horn cells (lower motor neurons) in the spinal cord and brain stem. We discussed the different types of SMA (0, I, II, and III), including differences in severity and age of onset. We also reviewed the autosomal recessive inheritance pattern of SMA.   We discussed that carrier testing by copy number analysis is recommended for the father of the pregnancy. Based on the carrier frequency for SMA in the African American population, he currently has a 1 in 66 chance of being a carrier of SMA. If he is found to have 2 copies of SMN1, his risk of being a carrier is reduced but not eliminated. If both parents are carriers of SMA, there is a 25%  chance of having an affected fetus. [name] indicated that she is interested in pursuing SMA carrier screening for her Reid and would consider prenatal diagnosis if her Reid is found to be a carrier.   TiffanySladewas alsoinformed abouttheEarly Checkresearch studyto add SMA to her baby's newborn screening panel should her Reid not undergo carrier screening. She was provided with an information sheet containing instructions for signing up for the research study, and was informed that the research study is free of charge to participate in.   Tiffany Reid carrier screening was negative for the other 13 conditions screened. Thus, her risk to be a carrier for these additional conditions (listed separately in the laboratory report) has been reduced but not eliminated.   We also reviewed that Tiffany Reid had Panorama NIPS through [facility] that was low-risk for fetal aneuploidies. We reviewed that these results showed a less than 1 in 10,000 risk for trisomies 21, 18 and 13, and monosomy X (Turner syndrome).  In addition, the risk for triploidy and sex chromosome trisomies (47,XXX and 47,XXY) was also low. [name] elected to have cffDNA analysis for 22q11 deletion syndrome, which was also low risk. We reviewed that while this testing identifies > 99% of pregnancies with trisomy 43, trisomy 54, sex chromosome trisomies (47,XXX and 47,XXY), and triploidy, it is NOT diagnostic. A positive test result requires confirmation by CVS or amniocentesis, and a negative test result does not rule out a fetal chromosome abnormality. She also understands that this testing does not identify all genetic conditions.  A complete ultrasound was performed today prior to our visit. The ultrasound report will be sent under separate  cover. There were no visualized fetal anomalies or markers suggestive of aneuploidy.   [name] was also counseled regarding diagnostic testing via amniocentesis. We discussed the technical  aspects of the procedure and quoted up to a 1 in 500 (0.2%) risk for spontaneous pregnancy loss or other adverse pregnancy outcomes as a result of amniocentesis. Cultured cells from an amniocentesis sample allow for the visualization of a fetal karyotype, which can detect >99% of chromosomal aberrations. Chromosomal microarray can also be performed to identify smaller deletions or duplications of fetal chromosomal material. Amniocentesis could also be performed to assess whether the baby is affected by SMA. After careful consideration, [name] declined amniocentesis at this time. She understands that amniocentesis is available at any point after 16 weeks of pregnancy and that she may opt to undergo the procedure at a later date should she change her mind.  Finally, we discussed available testing options to assess for the cause of Tiffany Reid's recurrent miscarriages. We reviewed that a maternal karyotype could assess for chromosomal aberrations such as balanced translocations in [name] that could possibly become unbalanced in her children. [name] was not interested in pursuing karyotype analysis, as she indicated that this will be her last pregnancy.  [name] desired Reid carrier screening for SMA for her Reid, Tiffany Reid; however, he does not have health insurance. I coordinated Reid carrier screening through the laboratory [facility] as Tiffany Reid qualifies for free testing through their Patient Assistance Program. I emailed [name] a copy of the Patient Assistance Program application for Mr. Tiffany Reid to sign and requested that she email me a photo of the signed form and the couple's tax forms from the previous filing year so that I can facilitate his free testing. The laboratory will send a saliva kit for Tiffany Reid to the couple's house. Once the laboratory receives Tiffany Reid sample, results will take 2-3 weeks to be returned. I will call [name] with results when they become  available.  I counseled [name] regarding the above risks and available options. The approximate face-to-face time with the genetic counselor was 30 minutes.  In summary:  Discussed carrier screening results and options for follow-up testing  Increased risk to be silent carrier for SMA  Desires Reid carrier screening via saliva kit. Orders for Reid carrier screening were placed today. She will send me copy of documents required for free testing. We will follow results   Reviewed low-risk NIPS results  Reduction in risk forDown syndrome,trisomy 18,trisomy 91, sex chromosome aneuploidies, and 22q11 deletion syndrome  Reviewed results of ultrasound  No fetal anomalies or markers seen  Reduction in risk for fetal aneuploidy  Offered additional testing and screening  Declined amniocentesis  Reviewed family history concerns   Buelah Manis, [place]

## 2019-09-09 ENCOUNTER — Encounter: Payer: Self-pay | Admitting: Obstetrics

## 2019-09-09 ENCOUNTER — Encounter: Payer: Self-pay | Admitting: Obstetrics and Gynecology

## 2019-09-09 ENCOUNTER — Telehealth (INDEPENDENT_AMBULATORY_CARE_PROVIDER_SITE_OTHER): Payer: Medicaid Other | Admitting: Obstetrics and Gynecology

## 2019-09-09 VITALS — BP 137/73 | HR 102 | Wt 243.2 lb

## 2019-09-09 DIAGNOSIS — O0992 Supervision of high risk pregnancy, unspecified, second trimester: Secondary | ICD-10-CM

## 2019-09-09 DIAGNOSIS — O09522 Supervision of elderly multigravida, second trimester: Secondary | ICD-10-CM

## 2019-09-09 DIAGNOSIS — Z3A19 19 weeks gestation of pregnancy: Secondary | ICD-10-CM

## 2019-09-09 DIAGNOSIS — O09292 Supervision of pregnancy with other poor reproductive or obstetric history, second trimester: Secondary | ICD-10-CM

## 2019-09-09 DIAGNOSIS — Z8632 Personal history of gestational diabetes: Secondary | ICD-10-CM

## 2019-09-09 DIAGNOSIS — O09299 Supervision of pregnancy with other poor reproductive or obstetric history, unspecified trimester: Secondary | ICD-10-CM

## 2019-09-09 DIAGNOSIS — M545 Low back pain: Secondary | ICD-10-CM

## 2019-09-09 DIAGNOSIS — O0990 Supervision of high risk pregnancy, unspecified, unspecified trimester: Secondary | ICD-10-CM

## 2019-09-09 NOTE — Progress Notes (Signed)
I connected with  Tiffany Reid on 09/09/19 by a video enabled telemedicine application and verified that I am speaking with the correct person using two identifiers.   MyChart ROB.  C/o pain  7/10 on left side towards her back, under armpit x 2 days

## 2019-09-09 NOTE — Progress Notes (Signed)
TELEHEALTH OBSTETRICS PRENATAL VIRTUAL VIDEO VISIT ENCOUNTER NOTE  Provider location: Center for [facility]   I connected with Tiffany Reid on 09/09/19 at  3:00 PM EDT by MyChart Video Encounter at home and verified that I am speaking with the correct person using two identifiers.   I discussed the limitations, risks, security and privacy concerns of performing an evaluation and management service virtually and the availability of in person appointments. I also discussed with the patient that there may be a patient responsible charge related to this service. The patient expressed understanding and agreed to proceed. Subjective:  Tiffany Reid is a 43 y.o. [phone] at [redacted]w[redacted]d being seen today for ongoing prenatal care.  She is currently monitored for the following issues for this high-risk pregnancy and has AMA (advanced maternal age) multigravida 35+; History of gestational diabetes; Supervision of high risk pregnancy, antepartum; and History of pre-eclampsia in prior pregnancy, currently pregnant on their problem list.  Patient reports backache.  Contractions: Not present. Vag. Bleeding: None.   . Denies any leaking of fluid.   The following portions of the patient's history were reviewed and updated as appropriate: allergies, current medications, past family history, past medical history, past social history, past surgical history and problem list.   Objective:   Vitals:   09/09/19 1452  BP: 137/73  Pulse: (!) 102  Weight: 243 lb 3.2 oz (110.3 kg)    Fetal Status:           General:  Alert, oriented and cooperative. Patient is in no acute distress.  Respiratory: Normal respiratory effort, no problems with respiration noted  Mental Status: Normal mood and affect. Normal behavior. Normal judgment and thought content.  Rest of physical exam deferred due to type of encounter  Imaging: [place] Mfm Ob Transvaginal  Result Date:  09/01/2019 ----------------------------------------------------------------------  OBSTETRICS REPORT                       (Signed Final 09/01/2019 03:02 pm) ---------------------------------------------------------------------- Patient Info  ID #:       [id number]                          D.O.B.:  February 08, 1976 (43 yrs)  Name:       Tiffany Reid                   Visit Date: 09/01/2019 01:11 pm ---------------------------------------------------------------------- Performed By  Performed By:     Lenise Arena        Ref. Address:     Faculty                    RDMS  Attending:        Ma Rings MD         [facility]  Referred By:      Catalina Antigua MD ---------------------------------------------------------------------- Orders   #  Description  Code         Ordered By   1  [place] MFM OB DETAIL +14 WK              [id number]     Markeith Jue   2  [place] MFM OB TRANSVAGINAL               [id number]      Corrisa Gibby  ----------------------------------------------------------------------   #  Order #                    Accession #                 Episode #   1  [id number]                  [id number]   2  [id number]                  [id number]                  [id number]  ---------------------------------------------------------------------- Indications   Advanced maternal age multigravida 65+,        O09.522   second trimester   Hypertension - Chronic/Pre-existing            O10.019   Encounter for antenatal screening for          Z36.3   malformations (negative AFP)   Poor obstetric history: Previous gestational   O09.299   diabetes   Poor obstetric history: Previous preeclampsia  O09.299   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 Cord Insertion:      Visualized, central  Amniotic Fluid  AFI FV:      Within normal limits                              Largest Pocket(cm)                              4.87 ---------------------------------------------------------------------- Biometry  BPD:      40.8  mm     G. Age:  18w 3d         61  %    CI:        73.68   %  70 - 86                                                          FL/HC:      19.1   %    15.8 - 18  HC:       151   mm     G. Age:  18w 1d         42  %    HC/AC:      1.10        1.07 - 1.29  AC:      137.3  mm     G. Age:  19w 1d         80  %    FL/BPD:     70.8   %  FL:       28.9  mm     G. Age:  18w 6d         72  %    FL/AC:      21.0   %    20 - 24  CER:      18.7  mm     G. Age:  18w 2d         57  %  NFT:       3.7  mm  LV:        7.2  mm  CM:        4.1  mm  Est. FW:     264  gm      0 lb 9 oz     88  % ---------------------------------------------------------------------- OB History  Gravidity:    8         Term:   3         SAB:   3  Living:       3 ---------------------------------------------------------------------- Gestational Age  LMP:           18w 1d        Date:  04/27/19                 EDD:   02/01/20  U/S Today:     18w 5d                                        EDD:   01/28/20  Best:          18w 1d     Det. By:  LMP  (04/27/19)          EDD:   02/01/20 ---------------------------------------------------------------------- Anatomy  Cranium:               Appears normal         Aortic Arch:            Appears normal  Cavum:                  Appears normal         Ductal Arch:            Appears normal  Ventricles:            Appears normal         Diaphragm:  Appears normal  Choroid Plexus:        Appears normal         Stomach:                Appears normal, left                                                                        sided  Cerebellum:            Appears normal         Abdomen:                Appears normal  Posterior Fossa:       Appears normal         Abdominal Wall:         Appears nml (cord                                                                        insert, abd wall)  Nuchal Fold:           Appears normal         Cord Vessels:           Appears normal (3                                                                        vessel cord)  Face:                  Appears normal         Kidneys:                Appear normal                         (orbits and profile)  Lips:                  Appears normal         Bladder:                Appears normal  Thoracic:              Appears normal         Spine:                  Limited views  appear normal  Heart:                 Not well visualized    Upper Extremities:      Appears normal  RVOT:                  Not well visualized    Lower Extremities:      Appears normal  LVOT:                  Not well visualized  Other:  Female gender. Heels and 5th digit visualized. Nasal bone visualized.          Technically difficult due to maternal habitus and fetal position. ---------------------------------------------------------------------- Cervix Uterus Adnexa  Cervix  Length:            2.8  cm.  Measured transvaginally.  Uterus  No abnormality visualized.  Left Ovary  Not visualized. No adnexal mass visualized  Right Ovary  Not visualized. No adnexal mass visualized.  Cul De Sac  No free fluid seen.  Adnexa  No abnormality visualized.  ---------------------------------------------------------------------- Comments  This patient was seen for a detailed fetal anatomy scan due  to advanced maternal age, maternal obesity, and history of  chronic hypertension.  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 She denies any problems in her current  pregnancy.  She reports that she had a cell free DNA test earlier in her  pregnancy which indicated a low risk for trisomy 6221, 7718, and  13. A female fetus is predicted.  She was informed that the fetal growth and amniotic fluid  level were appropriate for her gestational age.  There were no obvious fetal anomalies noted on today's  ultrasound exam.  However, due to the fetal position and her  earlier gestational age, the fetal cardiac views were unable to  be fully visualized.  The patient was informed that anomalies may be missed due  to technical limitations. If the fetus is in a suboptimal position  or maternal habitus is increased, visualization of the fetus in  the maternal uterus may be impaired.  A transvaginal ultrasound performed today showed a cervical  length of 2.8 cm long without any signs of funneling.  The increased risk of fetal aneuploidy due to advanced  maternal age was discussed. Due to advanced maternal age,  the patient was offered and declined an amniocentesis today  for definitive diagnosis of fetal aneuploidy.  As she is over 41 years old, weekly fetal testing should be  considered starting at around 32 weeks.  The patient is meeting with the genetic counselor following  today's ultrasound exam to discuss the significance of her  positive screen as a carrier for spinal muscular atrophy.  A follow-up exam was scheduled in 4 weeks to obtain better  views of the fetal anatomy. ----------------------------------------------------------------------                   Ma RingsVictor Fang, MD Electronically Signed Final Report   09/01/2019 03:02 pm  ----------------------------------------------------------------------  [place] Mfm Ob Detail +14 Wk  Result Date: 09/01/2019 ----------------------------------------------------------------------  OBSTETRICS REPORT                       (Signed Final 09/01/2019 03:02 pm) ---------------------------------------------------------------------- Patient Info  ID #:       [id number]  D.O.B.:  1976-04-13 (43 yrs)  Name:       Tiffany Reid                   Visit Date: 09/01/2019 01:11 pm ---------------------------------------------------------------------- Performed By  Performed By:     Lenise Arena        Ref. Address:     Faculty                    RDMS  Attending:        Ma Rings MD         [facility]  Referred By:      Catalina Antigua MD ---------------------------------------------------------------------- Orders   #  Description                          Code         Ordered By   1  [place] MFM OB DETAIL +14 WK              76811.01     Izabel Chim   2  [place] MFM OB TRANSVAGINAL               16109.6      Lilyanne Mcquown  ----------------------------------------------------------------------   #  Order #                    Accession #                 Episode #   1  [id number]                  [id number]   2  [id number]                  [id number]                  [id number]  ---------------------------------------------------------------------- Indications   Advanced maternal age multigravida 46+,        O09.522   second trimester   Hypertension - Chronic/Pre-existing            O10.019   Encounter for antenatal screening for          Z36.3   malformations (negative AFP)   Poor obstetric history: Previous gestational   O09.299   diabetes   Poor obstetric history: Previous preeclampsia  O09.299   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 Cord Insertion:      Visualized, central  Amniotic Fluid  AFI FV:      Within normal limits                              Largest Pocket(cm)                              4.87 ---------------------------------------------------------------------- Biometry  BPD:      40.8  mm     G. Age:  18w 3d         61  %    CI:        73.68   %    70 - 86                                                          FL/HC:      19.1   %    15.8 - 18  HC:       151   mm     G. Age:  18w 1d         42  %    HC/AC:      1.10        1.07 - 1.29  AC:      137.3  mm     G. Age:  19w 1d         80  %    FL/BPD:     70.8   %  FL:       28.9  mm     G. Age:  18w 6d         72  %    FL/AC:      21.0   %    20 - 24  CER:      18.7  mm     G. Age:  18w 2d         57  %  NFT:       3.7  mm  LV:        7.2  mm  CM:        4.1  mm  Est. FW:     264  gm      0 lb 9 oz     88  % ---------------------------------------------------------------------- OB History  Gravidity:    8         Term:   3         SAB:   3  Living:       3 ---------------------------------------------------------------------- Gestational Age  LMP:           18w 1d        Date:  04/27/19                 EDD:   02/01/20  U/S Today:     18w 5d                                        EDD:   01/28/20  Best:          18w 1d     Det. By:  LMP  (04/27/19)          EDD:   02/01/20 ----------------------------------------------------------------------  Anatomy  Cranium:               Appears normal         Aortic Arch:            Appears normal  Cavum:                 Appears normal         Ductal Arch:            Appears normal  Ventricles:            Appears normal         Diaphragm:              Appears normal  Choroid Plexus:        Appears normal         Stomach:                Appears normal, left                                                                        sided  Cerebellum:            Appears normal         Abdomen:                Appears normal  Posterior Fossa:       Appears normal         Abdominal Wall:         Appears nml (cord                                                                        insert, abd wall)  Nuchal Fold:           Appears normal         Cord Vessels:           Appears normal (3  vessel cord)  Face:                  Appears normal         Kidneys:                Appear normal                         (orbits and profile)  Lips:                  Appears normal         Bladder:                Appears normal  Thoracic:              Appears normal         Spine:                  Limited views                                                                        appear normal  Heart:                 Not well visualized    Upper Extremities:      Appears normal  RVOT:                  Not well visualized    Lower Extremities:      Appears normal  LVOT:                  Not well visualized  Other:  Female gender. Heels and 5th digit visualized. Nasal bone visualized.          Technically difficult due to maternal habitus and fetal position. ---------------------------------------------------------------------- Cervix Uterus Adnexa  Cervix  Length:            2.8  cm.  Measured transvaginally.  Uterus  No abnormality visualized.  Left Ovary  Not visualized. No adnexal mass visualized  Right Ovary  Not visualized. No adnexal mass visualized.  Cul De Sac  No  free fluid seen.  Adnexa  No abnormality visualized. ---------------------------------------------------------------------- Comments  This patient was seen for a detailed fetal anatomy scan due  to advanced maternal age, maternal obesity, and history of  chronic hypertension.  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 She denies any problems in her current  pregnancy.  She reports that she had a cell free DNA test earlier in her  pregnancy which indicated a low risk for trisomy 8, 17, and  13. A female fetus is predicted.  She was informed that the fetal growth and amniotic fluid  level were appropriate for her gestational age.  There were no obvious fetal anomalies noted on today's  ultrasound exam.  However, due to the fetal position and her  earlier gestational age, the fetal cardiac views were unable to  be fully visualized.  The patient was informed that anomalies may be missed due  to technical limitations. If the fetus is in a suboptimal position  or maternal habitus is increased, visualization of the fetus in  the maternal uterus may be impaired.  A transvaginal ultrasound performed today showed a cervical  length of 2.8 cm long without any signs of funneling.  The increased risk of fetal aneuploidy due to advanced  maternal age was discussed. Due to advanced maternal age,  the patient was offered and declined an amniocentesis today  for definitive diagnosis of fetal aneuploidy.  As she is over 47 years old, weekly fetal testing should be  considered starting at around 32 weeks.  The patient is meeting with the genetic counselor following  today's ultrasound exam to discuss the significance of her  positive screen as a carrier for spinal muscular atrophy.  A follow-up exam was scheduled in 4 weeks to obtain better  views of the fetal anatomy. ----------------------------------------------------------------------                   Ma Rings, MD Electronically Signed Final Report    09/01/2019 03:02 pm ----------------------------------------------------------------------   Assessment and Plan:  Pregnancy: [id number] at [redacted]w[redacted]d 1. Supervision of high risk pregnancy, antepartum Patient with musculoskeletal left back pain as a result of bending a lot at work. Encouraged patient to stretch via yoga type exercises, to wear her maternity belt at work and to use flexeril/tylenol as needed Follow up anatomy ultrasound  2. History of pre-eclampsia in prior pregnancy, currently pregnant Continue ASA  3. History of gestational diabetes   4. Multigravida of advanced maternal age in second trimester   Preterm labor symptoms and general obstetric precautions including but not limited to vaginal bleeding, contractions, leaking of fluid and fetal movement were reviewed in detail with the patient. I discussed the assessment and treatment plan with the patient. The patient was provided an opportunity to ask questions and all were answered. The patient agreed with the plan and demonstrated an understanding of the instructions. The patient was advised to call back or seek an in-person office evaluation/go to [facility] at [facility] for any urgent or concerning symptoms. Please refer to After Visit Summary for other counseling recommendations.   I provided 15 minutes of face-to-face time during this encounter.  No follow-ups on file.  Future Appointments  Date Time Provider Department Center  09/29/2019  2:15 PM WH-MFC [place] 4 WH-MFCUS MFC-US  09/29/2019  2:20 PM WH-MFC NURSE WH-MFC MFC-US    Catalina Antigua, MD [facility] for [employer], [facility]

## 2019-09-29 ENCOUNTER — Ambulatory Visit (HOSPITAL_COMMUNITY): Payer: BC Managed Care – PPO | Admitting: *Deleted

## 2019-09-29 ENCOUNTER — Encounter (HOSPITAL_COMMUNITY): Payer: Self-pay | Admitting: *Deleted

## 2019-09-29 ENCOUNTER — Other Ambulatory Visit: Payer: Self-pay

## 2019-09-29 ENCOUNTER — Ambulatory Visit (HOSPITAL_COMMUNITY)
Admission: RE | Admit: 2019-09-29 | Discharge: 2019-09-29 | Disposition: A | Discharge status: Home / Self Care | Payer: BC Managed Care – PPO | Source: Ambulatory Visit | Attending: Obstetrics and Gynecology | Admitting: Obstetrics and Gynecology

## 2019-09-29 VITALS — BP 110/56 | HR 88 | Temp 98.9°F

## 2019-09-29 DIAGNOSIS — O09522 Supervision of elderly multigravida, second trimester: Secondary | ICD-10-CM

## 2019-09-29 DIAGNOSIS — O09292 Supervision of pregnancy with other poor reproductive or obstetric history, second trimester: Secondary | ICD-10-CM

## 2019-09-29 DIAGNOSIS — Z3A22 22 weeks gestation of pregnancy: Secondary | ICD-10-CM | POA: Diagnosis not present

## 2019-09-29 DIAGNOSIS — Z362 Encounter for other antenatal screening follow-up: Secondary | ICD-10-CM | POA: Insufficient documentation

## 2019-09-30 ENCOUNTER — Other Ambulatory Visit (HOSPITAL_COMMUNITY): Payer: Self-pay | Admitting: *Deleted

## 2019-09-30 DIAGNOSIS — O09523 Supervision of elderly multigravida, third trimester: Secondary | ICD-10-CM

## 2019-10-07 ENCOUNTER — Encounter: Payer: Medicaid Other | Admitting: Obstetrics and Gynecology

## 2019-10-08 ENCOUNTER — Encounter: Payer: Self-pay | Admitting: Obstetrics and Gynecology

## 2019-10-08 ENCOUNTER — Other Ambulatory Visit: Payer: Self-pay

## 2019-10-08 ENCOUNTER — Other Ambulatory Visit (HOSPITAL_COMMUNITY)
Admission: RE | Admit: 2019-10-08 | Discharge: 2019-10-08 | Disposition: A | Discharge status: Home / Self Care | Payer: BC Managed Care – PPO | Source: Ambulatory Visit | Attending: Obstetrics and Gynecology | Admitting: Obstetrics and Gynecology

## 2019-10-08 ENCOUNTER — Ambulatory Visit (INDEPENDENT_AMBULATORY_CARE_PROVIDER_SITE_OTHER): Payer: Medicaid Other | Admitting: Obstetrics and Gynecology

## 2019-10-08 VITALS — BP 108/74 | HR 92 | Wt 248.0 lb

## 2019-10-08 DIAGNOSIS — Z8632 Personal history of gestational diabetes: Secondary | ICD-10-CM

## 2019-10-08 DIAGNOSIS — O09292 Supervision of pregnancy with other poor reproductive or obstetric history, second trimester: Secondary | ICD-10-CM

## 2019-10-08 DIAGNOSIS — N898 Other specified noninflammatory disorders of vagina: Secondary | ICD-10-CM | POA: Diagnosis present

## 2019-10-08 DIAGNOSIS — O0992 Supervision of high risk pregnancy, unspecified, second trimester: Secondary | ICD-10-CM

## 2019-10-08 DIAGNOSIS — O26892 Other specified pregnancy related conditions, second trimester: Secondary | ICD-10-CM | POA: Insufficient documentation

## 2019-10-08 DIAGNOSIS — O09522 Supervision of elderly multigravida, second trimester: Secondary | ICD-10-CM

## 2019-10-08 DIAGNOSIS — O09299 Supervision of pregnancy with other poor reproductive or obstetric history, unspecified trimester: Secondary | ICD-10-CM

## 2019-10-08 DIAGNOSIS — Z3A23 23 weeks gestation of pregnancy: Secondary | ICD-10-CM

## 2019-10-08 DIAGNOSIS — O0990 Supervision of high risk pregnancy, unspecified, unspecified trimester: Secondary | ICD-10-CM

## 2019-10-08 DIAGNOSIS — O26899 Other specified pregnancy related conditions, unspecified trimester: Secondary | ICD-10-CM | POA: Insufficient documentation

## 2019-10-08 NOTE — Progress Notes (Signed)
Subjective:  Tiffany Reid is a 43 y.o. [phone] at [redacted]w[redacted]d being seen today for ongoing prenatal care.  She is currently monitored for the following issues for this high-risk pregnancy and has AMA (advanced maternal age) multigravida 7+; History of gestational diabetes; Supervision of high risk pregnancy, antepartum; History of pre-eclampsia in prior pregnancy, currently pregnant; Genital herpes; GERD (gastroesophageal reflux disease); and Vaginal discharge during pregnancy on their problem list.  Patient reports vaginal discharge.  Contractions: Not present. Vag. Bleeding: None.  Movement: Present. Denies leaking of fluid.   The following portions of the patient's history were reviewed and updated as appropriate: allergies, current medications, past family history, past medical history, past social history, past surgical history and problem list. Problem list updated.  Objective:   Vitals:   10/08/19 1353  BP: 108/74  Pulse: 92  Weight: 248 lb (112.5 kg)    Fetal Status:     Movement: Present     General:  Alert, oriented and cooperative. Patient is in no acute distress.  Skin: Skin is warm and dry. No rash noted.   Cardiovascular: Normal heart rate noted  Respiratory: Normal respiratory effort, no problems with respiration noted  Abdomen: Soft, gravid, appropriate for gestational age. Pain/Pressure: Present     Pelvic:  Cervical exam performed        Extremities: Normal range of motion.  Edema: None  Mental Status: Normal mood and affect. Normal behavior. Normal judgment and thought content.   Urinalysis:      Assessment and Plan:  Pregnancy: [id number] at [redacted]w[redacted]d  1. Supervision of high risk pregnancy, antepartum Stable Glucola next visit - Culture, OB Urine*LC  2. History of pre-eclampsia in prior pregnancy, currently pregnant BP stable  3. History of gestational diabetes Glucola next visit  4. 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 Vaginal discharge during pregnancy in second trimester  - Cervicovaginal ancillary only( [place])  Preterm labor symptoms and general obstetric precautions including but not limited to vaginal bleeding, contractions, leaking of fluid and fetal movement were reviewed in detail with the patient. Please refer to After Visit Summary for other counseling recommendations.  Return in about 4 weeks (around 11/05/2019) for OB visit, face to face for Glucola.   Chancy Milroy, MD

## 2019-10-08 NOTE — Patient Instructions (Signed)

## 2019-10-10 LAB — CULTURE, OB URINE

## 2019-10-10 LAB — URINE CULTURE, OB REFLEX

## 2019-10-13 LAB — CERVICOVAGINAL ANCILLARY ONLY
Bacterial Vaginitis (gardnerella): NEGATIVE
Candida Glabrata: NEGATIVE
Candida Vaginitis: POSITIVE — AB
Chlamydia: NEGATIVE
Comment: NEGATIVE
Comment: NEGATIVE
Comment: NEGATIVE
Comment: NEGATIVE
Comment: NEGATIVE
Comment: NORMAL
Neisseria Gonorrhea: NEGATIVE
Trichomonas: NEGATIVE

## 2019-10-15 ENCOUNTER — Other Ambulatory Visit: Payer: Self-pay

## 2019-10-15 DIAGNOSIS — B379 Candidiasis, unspecified: Secondary | ICD-10-CM

## 2019-10-15 MED ORDER — TERCONAZOLE 0.4 % VA CREA: 1.0000 | TOPICAL_CREAM | Freq: Every day | VAGINAL | 0 refills | Status: DC

## 2019-10-15 NOTE — Progress Notes (Signed)
Rx sent as directed by Dr.Ervin. LL-CMA

## 2019-11-05 ENCOUNTER — Other Ambulatory Visit: Payer: Self-pay

## 2019-11-05 ENCOUNTER — Encounter: Payer: Self-pay | Admitting: Obstetrics and Gynecology

## 2019-11-05 ENCOUNTER — Ambulatory Visit (INDEPENDENT_AMBULATORY_CARE_PROVIDER_SITE_OTHER): Payer: BC Managed Care – PPO | Admitting: Obstetrics and Gynecology

## 2019-11-05 ENCOUNTER — Other Ambulatory Visit: Payer: BC Managed Care – PPO

## 2019-11-05 VITALS — BP 109/72 | HR 91 | Wt 255.6 lb

## 2019-11-05 DIAGNOSIS — O09299 Supervision of pregnancy with other poor reproductive or obstetric history, unspecified trimester: Secondary | ICD-10-CM

## 2019-11-05 DIAGNOSIS — O09523 Supervision of elderly multigravida, third trimester: Secondary | ICD-10-CM

## 2019-11-05 DIAGNOSIS — O09522 Supervision of elderly multigravida, second trimester: Secondary | ICD-10-CM

## 2019-11-05 DIAGNOSIS — O09292 Supervision of pregnancy with other poor reproductive or obstetric history, second trimester: Secondary | ICD-10-CM

## 2019-11-05 DIAGNOSIS — Z3A32 32 weeks gestation of pregnancy: Secondary | ICD-10-CM

## 2019-11-05 DIAGNOSIS — Z3A27 27 weeks gestation of pregnancy: Secondary | ICD-10-CM

## 2019-11-05 DIAGNOSIS — O0992 Supervision of high risk pregnancy, unspecified, second trimester: Secondary | ICD-10-CM

## 2019-11-05 DIAGNOSIS — A6000 Herpesviral infection of urogenital system, unspecified: Secondary | ICD-10-CM

## 2019-11-05 DIAGNOSIS — O0990 Supervision of high risk pregnancy, unspecified, unspecified trimester: Secondary | ICD-10-CM

## 2019-11-05 DIAGNOSIS — Z3009 Encounter for other general counseling and advice on contraception: Secondary | ICD-10-CM

## 2019-11-05 DIAGNOSIS — Z8632 Personal history of gestational diabetes: Secondary | ICD-10-CM

## 2019-11-05 MED ORDER — VALACYCLOVIR HCL 500 MG PO TABS: 500.0000 mg | ORAL_TABLET | Freq: Two times a day (BID) | ORAL | 3 refills | Status: DC

## 2019-11-05 NOTE — Progress Notes (Signed)
   PRENATAL VISIT NOTE  Subjective:  Tiffany Reid is a 43 y.o. [phone] at [redacted]w[redacted]d being seen today for ongoing prenatal care.  She is currently monitored for the following issues for this high-risk pregnancy and has AMA (advanced maternal age) multigravida 80+; History of gestational diabetes; Supervision of high risk pregnancy, antepartum; History of pre-eclampsia in prior pregnancy, currently pregnant; Genital herpes; GERD (gastroesophageal reflux disease); Vaginal discharge during pregnancy; and Unwanted fertility on their problem list.  Patient reports lower abdominal pain/pressure. Some left wrist pain. Reports some "stinging" in her vulva, started about 3-4 days ago.  Contractions: Not present. Vag. Bleeding: None.  Movement: Present. Denies leaking of fluid.   The following portions of the patient's history were reviewed and updated as appropriate: allergies, current medications, past family history, past medical history, past social history, past surgical history and problem list.   Objective:   Vitals:   11/05/19 0911  BP: 109/72  Pulse: 91  Weight: 255 lb 9.6 oz (115.9 kg)    Fetal Status: Fetal Heart Rate (bpm): 138   Movement: Present     General:  Alert, oriented and cooperative. Patient is in no acute distress.  Skin: Skin is warm and dry. No rash noted.   Cardiovascular: Normal heart rate noted  Respiratory: Normal respiratory effort, no problems with respiration noted  Abdomen: Soft, gravid, appropriate for gestational age.  Pain/Pressure: Present     Pelvic: Cervical exam deferred        Extremities: Normal range of motion.  Edema: None  Mental Status: Normal mood and affect. Normal behavior. Normal judgment and thought content.   Assessment and Plan:  Pregnancy: [id number] at [redacted]w[redacted]d  1. Supervision of high risk pregnancy, antepartum - Glucose Tolerance, 2 Hours w/1 Hour - RPR - CBC - HIV antibody (with reflex)  2. History of pre-eclampsia in prior pregnancy,  currently pregnant Cont baby ASA  3. Multigravida of advanced maternal age in third trimester  4. History of gestational diabetes  5. Genital herpes simplex, unspecified site Pt states she is having "stinging" sensation x 3-4 days. She has this sensation during her period usually, has never had an actual outbreak of HSV, was diagnosed based on blood work. No current blistering or other symptoms. ? Outbreak, will restart valtrex and continue suppressive therapy throughout rest of pregnancy  6. Unwanted fertility BTL papers signed today   Preterm labor symptoms and general obstetric precautions including but not limited to vaginal bleeding, contractions, leaking of fluid and fetal movement were reviewed in detail with the patient. Please refer to After Visit Summary for other counseling recommendations.   Return in about 2 weeks (around 11/19/2019) for high OB, virtual.  Future Appointments  Date Time Provider [place]  11/12/2019  2:15 PM [place] [facility]  11/12/2019  2:15 PM [place] 4 WH-[facility]  11/19/2019  9:30 AM Anyanwu, Sallyanne Havers, MD [facility] None    Sloan Leiter, MD

## 2019-11-05 NOTE — Progress Notes (Signed)
Patient reports fetal movement with pressure. 

## 2019-11-06 ENCOUNTER — Encounter: Payer: Self-pay | Admitting: Obstetrics and Gynecology

## 2019-11-06 LAB — CBC
Hematocrit: 31.5 % — ABNORMAL LOW (ref 34.0–46.6)
Hemoglobin: 10.5 g/dL — ABNORMAL LOW (ref 11.1–15.9)
MCH: 31.3 pg (ref 26.6–33.0)
MCHC: 33.3 g/dL (ref 31.5–35.7)
MCV: 94 fL (ref 79–97)
Platelets: 253 10*3/uL (ref 150–450)
RBC: 3.36 x10E6/uL — ABNORMAL LOW (ref 3.77–5.28)
RDW: 13.6 % (ref 11.7–15.4)
WBC: 9.8 10*3/uL (ref 3.4–10.8)

## 2019-11-06 LAB — HIV ANTIBODY (ROUTINE TESTING W REFLEX): HIV Screen 4th Generation wRfx: NONREACTIVE

## 2019-11-06 LAB — GLUCOSE TOLERANCE, 2 HOURS W/ 1HR
Glucose, 1 hour: 211 mg/dL — ABNORMAL HIGH (ref 65–179)
Glucose, 2 hour: 157 mg/dL — ABNORMAL HIGH (ref 65–152)
Glucose, Fasting: 108 mg/dL — ABNORMAL HIGH (ref 65–91)

## 2019-11-06 LAB — RPR: RPR Ser Ql: NONREACTIVE

## 2019-11-06 NOTE — Addendum Note (Signed)
Addended by: Delrae Alfred on: 11/06/2019 11:47 AM   Modules accepted: Orders

## 2019-11-09 ENCOUNTER — Telehealth (HOSPITAL_COMMUNITY): Payer: Self-pay | Admitting: Genetic Counselor

## 2019-11-09 NOTE — Telephone Encounter (Signed)
Received call back from [name] to discuss her partner's spinal muscular atrophy (SMA) carrier screening results. We reviewed that her partner, Tiffany Reid, was identified to have 2 copies of the SMN1 gene and was negative for the c. *3+80T>G SNP. This result reduces, but does not eliminate his risk to be a carrier for SMA. Based on the sensitivity of the screen, there is a 1 in 342 residual risk for Mr. Tiffany Reid to be a carrier for SMA. [name] was identified to have a 1 in 34 chance of being a silent carrier for SMA. Given Mr. Tiffany Reid negative results, the couple has a [phone] in [phone] chance to have a baby affected by SMA. [name] confirmed that she had no further questions at this time.  Buelah Manis, MS Genetic Counselor

## 2019-11-09 NOTE — Telephone Encounter (Signed)
LVM for [name] indicating that I was calling about her partner's spinal muscular atrophy (SMA) screening results. [name] partner was not identified to be a carrier of SMA. I requested a call back to my direct line to discuss these in more detail, as no identifiers were provided in voicemail message.   Buelah Manis, MS Genetic Counselor

## 2019-11-12 ENCOUNTER — Ambulatory Visit (HOSPITAL_COMMUNITY): Payer: BC Managed Care – PPO | Admitting: *Deleted

## 2019-11-12 ENCOUNTER — Encounter (HOSPITAL_COMMUNITY): Payer: Self-pay

## 2019-11-12 ENCOUNTER — Other Ambulatory Visit: Payer: Self-pay

## 2019-11-12 ENCOUNTER — Other Ambulatory Visit (HOSPITAL_COMMUNITY): Payer: Self-pay | Admitting: *Deleted

## 2019-11-12 ENCOUNTER — Ambulatory Visit (HOSPITAL_COMMUNITY)
Admission: RE | Admit: 2019-11-12 | Discharge: 2019-11-12 | Disposition: A | Discharge status: Home / Self Care | Payer: BC Managed Care – PPO | Source: Ambulatory Visit | Attending: Obstetrics and Gynecology | Admitting: Obstetrics and Gynecology

## 2019-11-12 DIAGNOSIS — Z3009 Encounter for other general counseling and advice on contraception: Secondary | ICD-10-CM | POA: Insufficient documentation

## 2019-11-12 DIAGNOSIS — Z362 Encounter for other antenatal screening follow-up: Secondary | ICD-10-CM | POA: Diagnosis not present

## 2019-11-12 DIAGNOSIS — O09523 Supervision of elderly multigravida, third trimester: Secondary | ICD-10-CM | POA: Diagnosis present

## 2019-11-12 DIAGNOSIS — O99213 Obesity complicating pregnancy, third trimester: Secondary | ICD-10-CM

## 2019-11-12 DIAGNOSIS — O24419 Gestational diabetes mellitus in pregnancy, unspecified control: Secondary | ICD-10-CM

## 2019-11-12 DIAGNOSIS — Z3A28 28 weeks gestation of pregnancy: Secondary | ICD-10-CM

## 2019-11-12 DIAGNOSIS — O09293 Supervision of pregnancy with other poor reproductive or obstetric history, third trimester: Secondary | ICD-10-CM

## 2019-11-13 ENCOUNTER — Other Ambulatory Visit (HOSPITAL_COMMUNITY): Payer: Self-pay | Admitting: *Deleted

## 2019-11-13 DIAGNOSIS — O24419 Gestational diabetes mellitus in pregnancy, unspecified control: Secondary | ICD-10-CM

## 2019-11-18 ENCOUNTER — Ambulatory Visit: Payer: BC Managed Care – PPO | Admitting: Registered"

## 2019-11-19 ENCOUNTER — Telehealth (INDEPENDENT_AMBULATORY_CARE_PROVIDER_SITE_OTHER): Payer: BC Managed Care – PPO | Admitting: Obstetrics & Gynecology

## 2019-11-19 DIAGNOSIS — O0990 Supervision of high risk pregnancy, unspecified, unspecified trimester: Secondary | ICD-10-CM

## 2019-11-19 DIAGNOSIS — Z3A29 29 weeks gestation of pregnancy: Secondary | ICD-10-CM

## 2019-11-19 DIAGNOSIS — O24419 Gestational diabetes mellitus in pregnancy, unspecified control: Secondary | ICD-10-CM

## 2019-11-19 DIAGNOSIS — O0993 Supervision of high risk pregnancy, unspecified, third trimester: Secondary | ICD-10-CM

## 2019-11-19 NOTE — Patient Instructions (Signed)
Return to office for any scheduled appointments. Call the office or go to the MAU at [facility] if:  You begin to have strong, frequent contractions  Your water breaks.  Sometimes it is a big gush of fluid, sometimes it is just a trickle that keeps getting your panties wet or running down your legs  You have vaginal bleeding.  It is normal to have a small amount of spotting if your cervix was checked.   You do not feel your baby moving like normal.  If you do not, get something to eat and drink and lay down and focus on feeling your baby move.   If your baby is still not moving like normal, you should call the office or go to MAU.  Any other obstetric concerns.   

## 2019-11-19 NOTE — Progress Notes (Signed)
TELEHEALTH OBSTETRICS PRENATAL VIRTUAL VIDEO VISIT ENCOUNTER NOTE  Provider location: [facility] [place]   I connected with [name] on 11/19/19 at  9:30 AM EST by MyChart Video Encounter at home and verified that I am speaking with the correct person using two identifiers.   I discussed the limitations, risks, security and privacy concerns of performing an evaluation and management service virtually and the availability of in person appointments. I also discussed with the patient that there may be a patient responsible charge related to this service. The patient expressed understanding and agreed to proceed. Subjective:  Rochelle Larue is a 43 y.o. [phone] at [redacted]w[redacted]d being seen today for ongoing prenatal care.  She is currently monitored for the following issues for this high-risk pregnancy and has Gestational diabetes; AMA (advanced maternal age) multigravida 35+; History of gestational diabetes; Supervision of high risk pregnancy, antepartum; History of pre-eclampsia in prior pregnancy, currently pregnant; Genital herpes; GERD (gastroesophageal reflux disease); Vaginal discharge during pregnancy; and Unwanted fertility on their problem list.  Patient reports no complaints.   .  .   . Denies any leaking of fluid.   The following portions of the patient's history were reviewed and updated as appropriate: allergies, current medications, past family history, past medical history, past social history, past surgical history and problem list.   Objective:  There were no vitals filed for this visit.  Fetal Status:           General:  Alert, oriented and cooperative. Patient is in no acute distress.  Respiratory: Normal respiratory effort, no problems with respiration noted  Mental Status: Normal mood and affect. Normal behavior. Normal judgment and thought content.  Rest of physical exam deferred due to type of encounter  Imaging: [place] MFM OB FOLLOW UP  Result Date:  11/12/2019 ----------------------------------------------------------------------  OBSTETRICS REPORT                       (Signed Final 11/12/2019 03:09 pm) ---------------------------------------------------------------------- Patient Info  ID #:       [id number]                          D.O.B.:  November 25, 1976 (43 yrs)  Name:       [name]                   Visit Date: 11/12/2019 02:14 pm ---------------------------------------------------------------------- Performed By  Performed By:     Magnus Ivan           Ref. Address:     Faculty                    RDMS, RVT  Attending:        Ma Rings MD         [facility]  Referred By:      Gigi Gin                    CONSTANT MD ---------------------------------------------------------------------- Orders   #  Description                          Code         Ordered By   1  [place] MFM OB FOLLOW UP                  [id number]     [facility] [place]  ----------------------------------------------------------------------   #  Order #                    Accession #                 Episode #   1  [id number]                  [id number]                  [id number]  ---------------------------------------------------------------------- Indications   Advanced maternal age multigravida 42+,        O74.523   third trimester   Obesity complicating pregnancy, third          O99.213   trimester   Encounter for fetal growth retardation         Z36.4   Gestational diabetes in pregnancy,             O24.419   unspecified control   Poor obstetric history: Previous preeclampsia  O09.299   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 Cord Insertion:      Previously Visualized  Amniotic Fluid  AFI FV:      Within normal limits  AFI Sum(cm)     %Tile       Largest Pocket(cm)  18.04           69          4.86  RUQ(cm)       RLQ(cm)       LUQ(cm)        LLQ(cm)  4.33          4.86          4.4            4.45 ---------------------------------------------------------------------- Biometry  BPD:      74.6  mm     G. Age:  29w 6d         83  %    CI:        74.47   %    70 - 86  FL/HC:      21.5   %    18.8 - 20.6  HC:      274.4  mm     G. Age:  30w 0d         67  %    HC/AC:      1.00        1.05 - 1.21  AC:      275.7  mm     G. Age:  31w 4d       > 99  %    FL/BPD:     79.0   %    71 - 87  FL:       58.9  mm     G. Age:  30w 5d         91  %    FL/AC:      21.4   %    20 - 24  Est. FW:    1689  gm    3 lb 12 oz    > 99  % ---------------------------------------------------------------------- OB History  Gravidity:    8         Term:   3         SAB:   3  Living:       3 ---------------------------------------------------------------------- Gestational Age  LMP:           28w 3d        Date:  04/27/19                 EDD:   02/01/20  U/S Today:     30w 4d                                        EDD:   01/17/20  Best:          28w 3d     Det. By:  LMP  (04/27/19)          EDD:   02/01/20 ---------------------------------------------------------------------- Anatomy  Cranium:               Appears normal         Aortic Arch:            Appears normal  Cavum:                 Appears normal         Ductal Arch:            Previously seen  Ventricles:            Appears normal         Diaphragm:              Previously seen  Choroid Plexus:         Previously seen        Stomach:                Appears normal, left                                                                        sided  Cerebellum:            Previously seen        Abdomen:                Previously seen  Posterior Fossa:       Previously seen        Abdominal Wall:         Previously seen  Face:                  Orbits and profile     Cord Vessels:           Previously seen                         previously seen  Lips:                  Previously seen        Kidneys:                Appear normal  Palate:                Previously seen        Bladder:                Appears normal  Thoracic:              Previously seen        Spine:                  Previously seen  Heart:                 Previously seen        Upper Extremities:      Previously seen  RVOT:                  Previously seen        Lower Extremities:      Previously seen  LVOT:                  Previously seen  Other:  Heels and 5th digit previously seen. Female gender. ---------------------------------------------------------------------- Comments  This patient was seen for a follow up growth scan due to  maternal obesity and advanced maternal age.  The patient  reports that she was recently diagnosed with gestational  diabetes.  She is scheduled for diabetic teaching next week.  The overall EFW obtained today was greater than the 99th  percentile for her gestational age.  There was normal  amniotic fluid noted today.  The patient reports that her last  child was delivered at 37 weeks weighing 9 pounds 12  ounces.  She was advised that this baby will most likely be of  similar weight.  A follow up exam was scheduled in 4 weeks.  As she is over  the age of 24 and due to gestational diabetes, we will start  weekly fetal testing starting in 4 weeks. ----------------------------------------------------------------------                   Ma Rings, MD Electronically Signed Final Report   11/12/2019 03:09 pm  ----------------------------------------------------------------------   Assessment and Plan:  Pregnancy: [id number] at [redacted]w[redacted]d 1. Gestational diabetes mellitus (GDM) in third trimester, gestational diabetes method of control unspecified Missed appointment for DM teaching due to childcare issues, will reschedule. Advised to check BS with remaining supplies from previous pregnancy.  Ultrasounds as per MFM.  2. Supervision of high risk pregnancy, antepartum Preterm labor symptoms and general obstetric precautions including but not limited to vaginal bleeding, contractions, leaking of fluid and fetal movement were reviewed in detail with the patient. I discussed the assessment and treatment plan with the patient. The patient was provided an opportunity to ask questions and all were answered. The patient agreed with the plan and demonstrated an understanding of the instructions. The patient was advised to call back or seek an in-person office evaluation/go to MAU at [facility] for any urgent or concerning symptoms. Please refer to After Visit Summary for other counseling recommendations.   I provided 10 minutes of face-to-face time during this encounter.  Return in about 2 weeks (around 12/03/2019) for Virtual OB Visit.  Future Appointments  Date Time Provider Department Center  12/10/2019  2:00 PM WH-MFC [place] 5 WH-MFCUS MFC-US  12/10/2019  2:10 PM WH-MFC NURSE WH-MFC MFC-US  12/17/2019  1:45 PM WH-MFC [place] 5 WH-MFCUS MFC-US  12/17/2019  1:50 PM WH-MFC NURSE WH-MFC MFC-US  12/24/2019  2:15 PM WH-MFC [place] 4 WH-MFCUS MFC-US  12/24/2019  2:20 PM WH-MFC NURSE WH-MFC MFC-US  12/31/2019  2:15 PM WH-MFC [place] 4 WH-MFCUS MFC-US  12/31/2019  2:20 PM WH-MFC NURSE WH-MFC MFC-US  01/07/2020  2:15 PM WH-MFC [place] 4 WH-MFCUS MFC-US  01/07/2020  2:20 PM WH-MFC NURSE WH-MFC MFC-US    Jaynie Collins, MD [facility] for [employer], [facility]

## 2019-12-02 ENCOUNTER — Ambulatory Visit: Payer: BC Managed Care – PPO

## 2019-12-03 ENCOUNTER — Encounter: Payer: Self-pay | Admitting: Obstetrics and Gynecology

## 2019-12-03 ENCOUNTER — Telehealth (INDEPENDENT_AMBULATORY_CARE_PROVIDER_SITE_OTHER): Payer: BC Managed Care – PPO | Admitting: Obstetrics and Gynecology

## 2019-12-03 VITALS — BP 116/61

## 2019-12-03 DIAGNOSIS — O09523 Supervision of elderly multigravida, third trimester: Secondary | ICD-10-CM | POA: Diagnosis not present

## 2019-12-03 DIAGNOSIS — Z3A31 31 weeks gestation of pregnancy: Secondary | ICD-10-CM

## 2019-12-03 DIAGNOSIS — O0993 Supervision of high risk pregnancy, unspecified, third trimester: Secondary | ICD-10-CM

## 2019-12-03 DIAGNOSIS — Z8632 Personal history of gestational diabetes: Secondary | ICD-10-CM

## 2019-12-03 DIAGNOSIS — O0990 Supervision of high risk pregnancy, unspecified, unspecified trimester: Secondary | ICD-10-CM

## 2019-12-03 DIAGNOSIS — O24419 Gestational diabetes mellitus in pregnancy, unspecified control: Secondary | ICD-10-CM | POA: Diagnosis not present

## 2019-12-03 DIAGNOSIS — Z3009 Encounter for other general counseling and advice on contraception: Secondary | ICD-10-CM

## 2019-12-03 MED ORDER — INSULIN NPH (HUMAN) (ISOPHANE) 100 UNIT/ML ~~LOC~~ SUSP: 16.0000 [IU] | Freq: Every day | SUBCUTANEOUS | 3 refills | Status: DC

## 2019-12-03 NOTE — Progress Notes (Signed)
TELEHEALTH OBSTETRICS PRENATAL VIRTUAL VIDEO VISIT ENCOUNTER NOTE  Provider location: [facility] [place]   I connected with Tiffany Reid on 12/03/19 at  9:30 AM EST by MyChart Video Encounter at home and verified that I am speaking with the correct person using two identifiers.   I discussed the limitations, risks, security and privacy concerns of performing an evaluation and management service virtually and the availability of in person appointments. I also discussed with the patient that there may be a patient responsible charge related to this service. The patient expressed understanding and agreed to proceed. Subjective:  Tiffany Reid is a 43 y.o. [phone] at [redacted]w[redacted]d being seen today for ongoing prenatal care.  She is currently monitored for the following issues for this high-risk pregnancy and has Gestational diabetes; AMA (advanced maternal age) multigravida 35+; History of gestational diabetes; Supervision of high risk pregnancy, antepartum; History of pre-eclampsia in prior pregnancy, currently pregnant; Genital herpes; GERD (gastroesophageal reflux disease); Vaginal discharge during pregnancy; and Unwanted fertility on their problem list.  Patient reports no complaints.  Contractions: Irritability. Vag. Bleeding: None.  Movement: Present. Denies any leaking of fluid.   The following portions of the patient's history were reviewed and updated as appropriate: allergies, current medications, past family history, past medical history, past social history, past surgical history and problem list.   Objective:   Vitals:   12/03/19 0934  BP: 116/61    Fetal Status:     Movement: Present     General:  Alert, oriented and cooperative. Patient is in no acute distress.  Respiratory: Normal respiratory effort, no problems with respiration noted  Mental Status: Normal mood and affect. Normal behavior. Normal judgment and thought content.  Rest of physical exam deferred  due to type of encounter  Imaging: [place] MFM OB FOLLOW UP  Result Date: 11/12/2019 ----------------------------------------------------------------------  OBSTETRICS REPORT                       (Signed Final 11/12/2019 03:09 pm) ---------------------------------------------------------------------- Patient Info  ID #:       [id number]                          D.O.B.:  06/25/1976 (43 yrs)  Name:       Tiffany Reid                   Visit Date: 11/12/2019 02:14 pm ---------------------------------------------------------------------- Performed By  Performed By:     Magnus Ivan           Ref. Address:     Faculty                    RDMS, RVT  Attending:        Ma Rings MD         [facility]  Referred By:      Gigi Gin                    Delayne Sanzo MD ----------------------------------------------------------------------  Orders   #  Description                          Code         Ordered By   1  US MFM OB FOLLOW UP                  [id number]     [facility]  ----------------------------------------------------------------------   #  Order #                    Accession #                 Episode #   1  [id number]                  [id number]                  [id number]  ---------------------------------------------------------------------- Indications   Advanced maternal age multigravida 5135+,        26O09.523   third trimester   Obesity complicating pregnancy, third          O99.213   trimester   Encounter for fetal growth retardation         Z36.4   Gestational diabetes in pregnancy,             O24.419   unspecified control   Poor obstetric history: Previous preeclampsia  O09.299   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 Cord Insertion:      Previously Visualized  Amniotic Fluid  AFI FV:      Within normal limits  AFI Sum(cm)     %Tile       Largest Pocket(cm)  18.04           69          4.86  RUQ(cm)       RLQ(cm)       LUQ(cm)        LLQ(cm)  4.33          4.86          4.4            4.45 ---------------------------------------------------------------------- Biometry  BPD:      74.6  mm     G. Age:  29w 6d         83  %    CI:        74.47   %    70 - 86  FL/HC:      21.5   %    18.8 - 20.6  HC:      274.4  mm     G. Age:  30w 0d         67  %    HC/AC:      1.00        1.05 - 1.21  AC:      275.7  mm     G. Age:  31w 4d       > 99  %    FL/BPD:     79.0   %    71 - 87  FL:       58.9  mm     G. Age:  30w 5d         91  %    FL/AC:      21.4   %    20 - 24  Est. FW:    1689  gm    3 lb 12 oz    > 99  % ---------------------------------------------------------------------- OB History  Gravidity:    8         Term:   3         SAB:   3  Living:       3 ---------------------------------------------------------------------- Gestational Age  LMP:           28w 3d        Date:  04/27/19                 EDD:   02/01/20  U/S Today:     30w 4d                                        EDD:   01/17/20  Best:          28w 3d     Det. By:  LMP  (04/27/19)          EDD:   02/01/20 ---------------------------------------------------------------------- Anatomy  Cranium:               Appears normal         Aortic Arch:            Appears normal  Cavum:                 Appears normal         Ductal Arch:            Previously seen  Ventricles:            Appears normal         Diaphragm:              Previously seen  Choroid  Plexus:        Previously seen        Stomach:                Appears normal, left                                                                        sided  Cerebellum:            Previously seen        Abdomen:                Previously seen  Posterior Fossa:       Previously seen        Abdominal Wall:         Previously seen  Face:                  Orbits and profile     Cord Vessels:           Previously seen                         previously seen  Lips:                  Previously seen        Kidneys:                Appear normal  Palate:                Previously seen        Bladder:                Appears normal  Thoracic:              Previously seen        Spine:                  Previously seen  Heart:                 Previously seen        Upper Extremities:      Previously seen  RVOT:                  Previously seen        Lower Extremities:      Previously seen  LVOT:                  Previously seen  Other:  Heels and 5th digit previously seen. Female gender. ---------------------------------------------------------------------- Comments  This patient was seen for a follow up growth scan due to  maternal obesity and advanced maternal age.  The patient  reports that she was recently diagnosed with gestational  diabetes.  She is scheduled for diabetic teaching next week.  The overall EFW obtained today was greater than the 99th  percentile for her gestational age.  There was normal  amniotic fluid noted today.  The patient reports that her last  child was delivered at 37 weeks weighing 9 pounds 12  ounces.  She was advised that this baby will most likely be of  similar weight.  A follow up exam was scheduled in 4 weeks.  As she is over  the age of 16 and due to gestational diabetes, we will start  weekly fetal testing starting in 4 weeks. ----------------------------------------------------------------------                   Johnell Comings, MD Electronically Signed Final Report   11/12/2019 03:09 pm  ----------------------------------------------------------------------   Assessment and Plan:  Pregnancy: [id number] at [redacted]w[redacted]d 1. Unwanted fertility BTL form previous signed  2. Supervision of high risk pregnancy, antepartum Patient is doing well without complaints  3. Gestational diabetes mellitus (GDM) in third trimester, gestational diabetes method of control unspecified Patient reports elevated fasting 115-126  and pp within normal range 90-110 Discussed starting metformin at bedtime but patient prefers to start with insulin as she was on in with her past pregnancy Follow up MFM scan on 1/7  4. Multigravida of advanced maternal age in third trimester    Preterm labor symptoms and general obstetric precautions including but not limited to vaginal bleeding, contractions, leaking of fluid and fetal movement were reviewed in detail with the patient. I discussed the assessment and treatment plan with the patient. The patient was provided an opportunity to ask questions and all were answered. The patient agreed with the plan and demonstrated an understanding of the instructions. The patient was advised to call back or seek an in-person office evaluation/go to [facility]U at [facility] for any urgent or concerning symptoms. Please refer to After Visit Summary for other counseling recommendations.   I provided 11 minutes of face-to-face time during this encounter.  No follow-ups on file.  Future Appointments  Date Time Provider Department Center  12/10/2019  2:00 PM WH-MFC [place] 5 WH-MFCUS MFC-US  12/10/2019  2:10 PM WH-MFC NURSE WH-MFC MFC-US  12/17/2019  1:45 PM WH-MFC [place] 5 WH-MFCUS MFC-US  12/17/2019  1:50 PM WH-MFC NURSE WH-MFC MFC-US  12/24/2019  2:15 PM WH-MFC [place] 4 WH-MFCUS MFC-US  12/24/2019  2:20 PM WH-MFC NURSE WH-MFC MFC-US  12/31/2019  2:15 PM WH-MFC [place] 4 WH-MFCUS MFC-US  12/31/2019  2:20 PM WH-MFC NURSE WH-MFC MFC-US  01/07/2020  2:15 PM WH-MFC [place] 4 WH-MFCUS MFC-US  01/07/2020  2:20  PM WH-MFC NURSE WH-MFC MFC-US    Catalina Antigua, MD Center for [employer], [facility]

## 2019-12-03 NOTE — Progress Notes (Signed)
Pt is on the phone preparing for virtual visit with provider. [redacted]w[redacted]d.

## 2019-12-10 ENCOUNTER — Other Ambulatory Visit: Payer: Self-pay

## 2019-12-10 ENCOUNTER — Ambulatory Visit (HOSPITAL_COMMUNITY)
Admission: RE | Admit: 2019-12-10 | Discharge: 2019-12-10 | Disposition: A | Discharge status: Home / Self Care | Payer: Medicaid Other | Source: Ambulatory Visit | Attending: Obstetrics and Gynecology | Admitting: Obstetrics and Gynecology

## 2019-12-10 ENCOUNTER — Ambulatory Visit (HOSPITAL_COMMUNITY): Payer: Medicaid Other | Admitting: *Deleted

## 2019-12-10 ENCOUNTER — Encounter (HOSPITAL_COMMUNITY): Payer: Self-pay

## 2019-12-10 DIAGNOSIS — Z3009 Encounter for other general counseling and advice on contraception: Secondary | ICD-10-CM | POA: Insufficient documentation

## 2019-12-10 DIAGNOSIS — O99213 Obesity complicating pregnancy, third trimester: Secondary | ICD-10-CM

## 2019-12-10 DIAGNOSIS — O24414 Gestational diabetes mellitus in pregnancy, insulin controlled: Secondary | ICD-10-CM

## 2019-12-10 DIAGNOSIS — O09523 Supervision of elderly multigravida, third trimester: Secondary | ICD-10-CM | POA: Diagnosis not present

## 2019-12-10 DIAGNOSIS — Z3A32 32 weeks gestation of pregnancy: Secondary | ICD-10-CM

## 2019-12-10 DIAGNOSIS — O09293 Supervision of pregnancy with other poor reproductive or obstetric history, third trimester: Secondary | ICD-10-CM | POA: Diagnosis not present

## 2019-12-10 DIAGNOSIS — Z362 Encounter for other antenatal screening follow-up: Secondary | ICD-10-CM | POA: Diagnosis not present

## 2019-12-10 DIAGNOSIS — O24419 Gestational diabetes mellitus in pregnancy, unspecified control: Secondary | ICD-10-CM | POA: Diagnosis present

## 2019-12-17 ENCOUNTER — Ambulatory Visit (HOSPITAL_COMMUNITY)
Admission: RE | Admit: 2019-12-17 | Discharge: 2019-12-17 | Disposition: A | Discharge status: Home / Self Care | Payer: Medicaid Other | Source: Ambulatory Visit | Attending: Obstetrics and Gynecology | Admitting: Obstetrics and Gynecology

## 2019-12-17 ENCOUNTER — Telehealth (INDEPENDENT_AMBULATORY_CARE_PROVIDER_SITE_OTHER): Payer: Medicaid Other | Admitting: Obstetrics and Gynecology

## 2019-12-17 ENCOUNTER — Other Ambulatory Visit: Payer: Self-pay

## 2019-12-17 ENCOUNTER — Ambulatory Visit (HOSPITAL_COMMUNITY): Payer: Medicaid Other | Admitting: *Deleted

## 2019-12-17 ENCOUNTER — Encounter: Payer: Self-pay | Admitting: Obstetrics and Gynecology

## 2019-12-17 ENCOUNTER — Encounter (HOSPITAL_COMMUNITY): Payer: Self-pay

## 2019-12-17 VITALS — BP 111/60 | HR 97

## 2019-12-17 DIAGNOSIS — Z3009 Encounter for other general counseling and advice on contraception: Secondary | ICD-10-CM | POA: Insufficient documentation

## 2019-12-17 DIAGNOSIS — O09293 Supervision of pregnancy with other poor reproductive or obstetric history, third trimester: Secondary | ICD-10-CM

## 2019-12-17 DIAGNOSIS — O09523 Supervision of elderly multigravida, third trimester: Secondary | ICD-10-CM

## 2019-12-17 DIAGNOSIS — O24414 Gestational diabetes mellitus in pregnancy, insulin controlled: Secondary | ICD-10-CM | POA: Diagnosis not present

## 2019-12-17 DIAGNOSIS — Z3A33 33 weeks gestation of pregnancy: Secondary | ICD-10-CM

## 2019-12-17 DIAGNOSIS — O24419 Gestational diabetes mellitus in pregnancy, unspecified control: Secondary | ICD-10-CM

## 2019-12-17 DIAGNOSIS — O0990 Supervision of high risk pregnancy, unspecified, unspecified trimester: Secondary | ICD-10-CM

## 2019-12-17 DIAGNOSIS — O99213 Obesity complicating pregnancy, third trimester: Secondary | ICD-10-CM

## 2019-12-17 DIAGNOSIS — O09299 Supervision of pregnancy with other poor reproductive or obstetric history, unspecified trimester: Secondary | ICD-10-CM

## 2019-12-17 DIAGNOSIS — O0993 Supervision of high risk pregnancy, unspecified, third trimester: Secondary | ICD-10-CM

## 2019-12-17 MED ORDER — INSULIN NPH (HUMAN) (ISOPHANE) 100 UNIT/ML ~~LOC~~ SUSP: 16.0000 [IU] | Freq: Every day | SUBCUTANEOUS | 2 refills | Status: DC

## 2019-12-17 NOTE — Progress Notes (Signed)
Pt states that she has not yet started on insulin, states insurance wouldn't cover it. Pt states fasting glucose is still ranging from 100-110.

## 2019-12-17 NOTE — Progress Notes (Signed)
TELEHEALTH OBSTETRICS PRENATAL VIRTUAL VIDEO VISIT ENCOUNTER NOTE  Provider location: [facility] for [place] [facility] [place]   I connected with Tiffany Reid on 12/17/19 at  9:45 AM EST by MyChart Video Encounter at home and verified that I am speaking with the correct person using two identifiers.   I discussed the limitations, risks, security and privacy concerns of performing an evaluation and management service virtually and the availability of in person appointments. I also discussed with the patient that there may be a patient responsible charge related to this service. The patient expressed understanding and agreed to proceed. Subjective:  Tiffany Reid is a 44 y.o. [phone] at [redacted]w[redacted]d being seen today for ongoing prenatal care.  She is currently monitored for the following issues for this high-risk pregnancy and has Gestational diabetes; AMA (advanced maternal age) multigravida 39+; History of gestational diabetes; Supervision of high risk pregnancy, antepartum; History of pre-eclampsia in prior pregnancy, currently pregnant; Genital herpes; GERD (gastroesophageal reflux disease); Vaginal discharge during pregnancy; and Unwanted fertility on their problem list.  Patient reports pelvic pain and pressure.  Contractions: Irritability. Vag. Bleeding: None.  Movement: Present. Denies any leaking of fluid.   The following portions of the patient's history were reviewed and updated as appropriate: allergies, current medications, past family history, past medical history, past social history, past surgical history and problem list.   Objective:   Vitals:   12/17/19 0921  BP: 111/60  Pulse: 97   Fetal Status:     Movement: Present     General:  Alert, oriented and cooperative. Patient is in no acute distress.  Respiratory: Normal respiratory effort, no problems with respiration noted  Mental Status: Normal mood and affect. Normal behavior. Normal judgment and thought content.  Rest of  physical exam deferred due to type of encounter  Imaging:   Assessment and Plan:  Pregnancy: [id number] at [redacted]w[redacted]d  1. Unwanted fertility For BTL  2. Supervision of high risk pregnancy, antepartum Reviewed hospital policies regarding visitors, masking, COVID testing, etc.  3. History of pre-eclampsia in prior pregnancy, currently pregnant BP stable  4. Multigravida of advanced maternal age in third trimester  5. Gestational diabetes mellitus (GDM) in third trimester, gestational diabetes method of control unspecified Has not started insulin yet due to insurance coverage CMA to call today to check on that Resent Humulin N to pharmacy today FG: 100-110 PP: 79-85 Growth scan today  Preterm labor symptoms and general obstetric precautions including but not limited to vaginal bleeding, contractions, leaking of fluid and fetal movement were reviewed in detail with the patient. I discussed the assessment and treatment plan with the patient. The patient was provided an opportunity to ask questions and all were answered. The patient agreed with the plan and demonstrated an understanding of the instructions. The patient was advised to call back or seek an in-person office evaluation/go to [facility]U at [facility] for any urgent or concerning symptoms. Please refer to After Visit Summary for other counseling recommendations.   I provided 20 minutes of face-to-face time during this encounter.  Return in about 2 weeks (around 12/31/2019) for high OB, virtual.  Future Appointments  Date Time Provider [place]  12/17/2019  1:45 PM WH-MFC [place] 5 WH-MFCUS MFC-US  12/17/2019  1:50 PM [place] MFC-US  12/24/2019  2:15 PM WH-MFC [place] 4 WH-MFCUS MFC-US  12/24/2019  2:20 PM [place] MFC-US  12/31/2019  2:15 PM [place] 4 WH-MFCUS MFC-US  12/31/2019  2:20 PM  WH-MFC NURSE WH-MFC MFC-US  01/07/2020  2:15 PM WH-MFC [place] 4 WH-MFCUS MFC-US  01/07/2020  2:20 PM WH-MFC NURSE WH-MFC  MFC-US    Conan Bowens, MD Center for [facility], [facility]

## 2019-12-24 ENCOUNTER — Ambulatory Visit (HOSPITAL_COMMUNITY): Payer: Medicaid Other | Admitting: *Deleted

## 2019-12-24 ENCOUNTER — Encounter (HOSPITAL_COMMUNITY): Payer: Self-pay

## 2019-12-24 ENCOUNTER — Other Ambulatory Visit: Payer: Self-pay

## 2019-12-24 ENCOUNTER — Ambulatory Visit (HOSPITAL_COMMUNITY)
Admission: RE | Admit: 2019-12-24 | Discharge: 2019-12-24 | Disposition: A | Discharge status: Home / Self Care | Payer: Medicaid Other | Source: Ambulatory Visit | Attending: Obstetrics and Gynecology | Admitting: Obstetrics and Gynecology

## 2019-12-24 DIAGNOSIS — Z3009 Encounter for other general counseling and advice on contraception: Secondary | ICD-10-CM | POA: Diagnosis present

## 2019-12-24 DIAGNOSIS — O24419 Gestational diabetes mellitus in pregnancy, unspecified control: Secondary | ICD-10-CM | POA: Diagnosis present

## 2019-12-28 ENCOUNTER — Other Ambulatory Visit (HOSPITAL_COMMUNITY): Payer: Self-pay | Admitting: *Deleted

## 2019-12-28 DIAGNOSIS — O09529 Supervision of elderly multigravida, unspecified trimester: Secondary | ICD-10-CM

## 2019-12-31 ENCOUNTER — Encounter (HOSPITAL_COMMUNITY): Payer: Self-pay

## 2019-12-31 ENCOUNTER — Ambulatory Visit (HOSPITAL_COMMUNITY): Payer: Medicaid Other

## 2019-12-31 ENCOUNTER — Telehealth (INDEPENDENT_AMBULATORY_CARE_PROVIDER_SITE_OTHER): Payer: Medicaid Other | Admitting: Obstetrics and Gynecology

## 2019-12-31 ENCOUNTER — Encounter: Payer: Self-pay | Admitting: Obstetrics and Gynecology

## 2019-12-31 ENCOUNTER — Ambulatory Visit (HOSPITAL_COMMUNITY)
Admission: RE | Admit: 2019-12-31 | Discharge: 2019-12-31 | Disposition: A | Discharge status: Home / Self Care | Payer: Medicaid Other | Source: Ambulatory Visit | Attending: Obstetrics and Gynecology | Admitting: Obstetrics and Gynecology

## 2019-12-31 ENCOUNTER — Ambulatory Visit (HOSPITAL_COMMUNITY): Payer: Medicaid Other | Admitting: *Deleted

## 2019-12-31 ENCOUNTER — Other Ambulatory Visit: Payer: Self-pay

## 2019-12-31 VITALS — BP 129/75 | HR 86

## 2019-12-31 DIAGNOSIS — O24414 Gestational diabetes mellitus in pregnancy, insulin controlled: Secondary | ICD-10-CM | POA: Diagnosis not present

## 2019-12-31 DIAGNOSIS — O09293 Supervision of pregnancy with other poor reproductive or obstetric history, third trimester: Secondary | ICD-10-CM

## 2019-12-31 DIAGNOSIS — O24419 Gestational diabetes mellitus in pregnancy, unspecified control: Secondary | ICD-10-CM

## 2019-12-31 DIAGNOSIS — O0990 Supervision of high risk pregnancy, unspecified, unspecified trimester: Secondary | ICD-10-CM

## 2019-12-31 DIAGNOSIS — Z3009 Encounter for other general counseling and advice on contraception: Secondary | ICD-10-CM

## 2019-12-31 DIAGNOSIS — O09523 Supervision of elderly multigravida, third trimester: Secondary | ICD-10-CM

## 2019-12-31 DIAGNOSIS — O09529 Supervision of elderly multigravida, unspecified trimester: Secondary | ICD-10-CM | POA: Insufficient documentation

## 2019-12-31 DIAGNOSIS — O99213 Obesity complicating pregnancy, third trimester: Secondary | ICD-10-CM | POA: Diagnosis not present

## 2019-12-31 DIAGNOSIS — Z3A35 35 weeks gestation of pregnancy: Secondary | ICD-10-CM

## 2019-12-31 DIAGNOSIS — O09299 Supervision of pregnancy with other poor reproductive or obstetric history, unspecified trimester: Secondary | ICD-10-CM

## 2019-12-31 DIAGNOSIS — B009 Herpesviral infection, unspecified: Secondary | ICD-10-CM | POA: Insufficient documentation

## 2019-12-31 DIAGNOSIS — Z3A34 34 weeks gestation of pregnancy: Secondary | ICD-10-CM

## 2019-12-31 DIAGNOSIS — O0993 Supervision of high risk pregnancy, unspecified, third trimester: Secondary | ICD-10-CM

## 2019-12-31 NOTE — Progress Notes (Signed)
TELEHEALTH OBSTETRICS PRENATAL VIRTUAL VIDEO VISIT ENCOUNTER NOTE  Provider location: [facility] for [facility] at [place]   I connected with [name] on 12/31/19 at  9:00 AM EST by MyChart Video Encounter at home and verified that I am speaking with the correct person using two identifiers.   I discussed the limitations, risks, security and privacy concerns of performing an evaluation and management service virtually and the availability of in person appointments. I also discussed with the patient that there may be a patient responsible charge related to this service. The patient expressed understanding and agreed to proceed. Subjective:  Tiffany Reid is a 44 y.o. [phone] at [redacted]w[redacted]d being seen today for ongoing prenatal care.  She is currently monitored for the following issues for this high-risk pregnancy and has Gestational diabetes; AMA (advanced maternal age) multigravida 35+; History of gestational diabetes; Supervision of high risk pregnancy, antepartum; History of pre-eclampsia in prior pregnancy, currently pregnant; Genital herpes; GERD (gastroesophageal reflux disease); Vaginal discharge during pregnancy; Unwanted fertility; and HSV infection on their problem list.  Patient reports no complaints.  Contractions: Irritability. Vag. Bleeding: None.  Movement: Present. Denies any leaking of fluid.   The following portions of the patient's history were reviewed and updated as appropriate: allergies, current medications, past family history, past medical history, past social history, past surgical history and problem list.   Objective:   Vitals:   12/31/19 0848  BP: 129/75  Pulse: 86    Fetal Status:     Movement: Present     General:  Alert, oriented and cooperative. Patient is in no acute distress.  Respiratory: Normal respiratory effort, no problems with respiration noted  Mental Status: Normal mood and affect. Normal behavior. Normal judgment and thought content.  Rest  of physical exam deferred due to type of encounter  Imaging:  Assessment and Plan:  Pregnancy: [id number] at [redacted]w[redacted]d  1. Unwanted fertility For BTL  2. Supervision of high risk pregnancy, antepartum Reviewed hospital policies regarding visitors, masking, COVID testing, etc.  3. Multigravida of advanced maternal age in third trimester Weekly testing BPP today  4. Gestational diabetes mellitus (GDM) in third trimester, gestational diabetes method of control unspecified On NPH 20 units QHS FG: 95-115 PP: 74-95 Increase to NPH 24 units QHS  5. History of pre-eclampsia in prior pregnancy, currently pregnant BP stable  6. HSV infection No s/s  Cont valtrex   Preterm labor symptoms and general obstetric precautions including but not limited to vaginal bleeding, contractions, leaking of fluid and fetal movement were reviewed in detail with the patient. I discussed the assessment and treatment plan with the patient. The patient was provided an opportunity to ask questions and all were answered. The patient agreed with the plan and demonstrated an understanding of the instructions. The patient was advised to call back or seek an in-person office evaluation/go to MAU at [facility] for any urgent or concerning symptoms. Please refer to After Visit Summary for other counseling recommendations.   I provided 15 minutes of face-to-face time during this encounter.  Return in about 1 week (around 01/07/2020) for high OB, in person, 36 week swabs.  Future Appointments  Date Time Provider Department Center  12/31/2019  2:00 PM [facility] NURSE WH-MFC MFC-US  12/31/2019  2:15 PM WH-MFC [place] 4 WH-MFCUS MFC-US  01/07/2020  2:00 PM WH-MFC NURSE WH-MFC MFC-US  01/07/2020  2:15 PM WH-MFC [place] 4 WH-MFCUS MFC-US    Conan Bowens, MD Center for [facility], [place]  [facility]

## 2019-12-31 NOTE — Progress Notes (Signed)
I connected with  Tiffany Reid on 12/31/19 by a video enabled telemedicine application and verified that I am speaking with the correct person using two identifiers.   I discussed the limitations of evaluation and management by telemedicine. The patient expressed understanding and agreed to proceed.  BS readings: AM fasting 100-115 after meals 79-95.

## 2020-01-07 ENCOUNTER — Other Ambulatory Visit (HOSPITAL_COMMUNITY)
Admission: RE | Admit: 2020-01-07 | Discharge: 2020-01-07 | Disposition: A | Discharge status: Home / Self Care | Payer: Medicaid Other | Source: Ambulatory Visit | Attending: Obstetrics & Gynecology | Admitting: Obstetrics & Gynecology

## 2020-01-07 ENCOUNTER — Ambulatory Visit (HOSPITAL_BASED_OUTPATIENT_CLINIC_OR_DEPARTMENT_OTHER)
Admission: RE | Admit: 2020-01-07 | Discharge: 2020-01-07 | Disposition: A | Discharge status: Home / Self Care | Payer: Medicaid Other | Source: Ambulatory Visit | Attending: Obstetrics and Gynecology | Admitting: Obstetrics and Gynecology

## 2020-01-07 ENCOUNTER — Ambulatory Visit (HOSPITAL_COMMUNITY): Payer: Medicaid Other | Admitting: *Deleted

## 2020-01-07 ENCOUNTER — Ambulatory Visit (INDEPENDENT_AMBULATORY_CARE_PROVIDER_SITE_OTHER): Payer: Medicaid Other | Admitting: Obstetrics & Gynecology

## 2020-01-07 ENCOUNTER — Other Ambulatory Visit: Payer: Self-pay

## 2020-01-07 ENCOUNTER — Ambulatory Visit (HOSPITAL_COMMUNITY): Admission: RE | Admit: 2020-01-07 | Payer: Medicaid Other | Source: Ambulatory Visit

## 2020-01-07 ENCOUNTER — Other Ambulatory Visit (HOSPITAL_COMMUNITY): Payer: Self-pay | Admitting: *Deleted

## 2020-01-07 ENCOUNTER — Encounter (HOSPITAL_COMMUNITY): Payer: Self-pay

## 2020-01-07 VITALS — BP 118/75 | HR 90 | Wt 253.0 lb

## 2020-01-07 DIAGNOSIS — O09523 Supervision of elderly multigravida, third trimester: Secondary | ICD-10-CM

## 2020-01-07 DIAGNOSIS — O99213 Obesity complicating pregnancy, third trimester: Secondary | ICD-10-CM | POA: Diagnosis not present

## 2020-01-07 DIAGNOSIS — Z3009 Encounter for other general counseling and advice on contraception: Secondary | ICD-10-CM | POA: Insufficient documentation

## 2020-01-07 DIAGNOSIS — O0993 Supervision of high risk pregnancy, unspecified, third trimester: Secondary | ICD-10-CM

## 2020-01-07 DIAGNOSIS — Z362 Encounter for other antenatal screening follow-up: Secondary | ICD-10-CM

## 2020-01-07 DIAGNOSIS — O24414 Gestational diabetes mellitus in pregnancy, insulin controlled: Secondary | ICD-10-CM | POA: Diagnosis not present

## 2020-01-07 DIAGNOSIS — O09529 Supervision of elderly multigravida, unspecified trimester: Secondary | ICD-10-CM

## 2020-01-07 DIAGNOSIS — O0990 Supervision of high risk pregnancy, unspecified, unspecified trimester: Secondary | ICD-10-CM

## 2020-01-07 DIAGNOSIS — O24419 Gestational diabetes mellitus in pregnancy, unspecified control: Secondary | ICD-10-CM

## 2020-01-07 DIAGNOSIS — Z23 Encounter for immunization: Secondary | ICD-10-CM

## 2020-01-07 DIAGNOSIS — Z3A36 36 weeks gestation of pregnancy: Secondary | ICD-10-CM | POA: Diagnosis not present

## 2020-01-07 MED ORDER — PANTOPRAZOLE SODIUM 40 MG PO TBEC: 40.0000 mg | DELAYED_RELEASE_TABLET | Freq: Every day | ORAL | 1 refills | Status: DC

## 2020-01-07 MED ORDER — INSULIN NPH (HUMAN) (ISOPHANE) 100 UNIT/ML ~~LOC~~ SUSP: 28.0000 [IU] | Freq: Every day | SUBCUTANEOUS | 2 refills | Status: DC

## 2020-01-07 NOTE — Patient Instructions (Signed)
Type 1 or Type 2 Diabetes Mellitus During Pregnancy, Diagnosis Type 1 diabetes (type 1 diabetes mellitus) and type 2 diabetes (type 2 diabetes mellitus) are long-term (chronic) diseases. Your diabetes may be caused by one or both of these problems:  Your pancreas does not make enough of a hormone called insulin.  Your pancreas does not respond in a normal way to insulin that it makes. Insulin lets sugars (glucose) go into cells in the body. This gives you energy. If you have diabetes, sugars cannot get into cells. This causes high blood sugar (hyperglycemia). If diabetes is treated, it may not hurt you or your baby. Your doctor will set treatment goals for you. In general, you should have these blood sugar levels:  After not eating for a long time (fasting): 95 mg/dL (5.3 mmol/L).  After meals (postprandial): ? One hour after a meal: at or below 140 mg/dL (7.8 mmol/L). ? Two hours after a meal: at or below 120 mg/dL (6.7 mmol/L).  A1c (hemoglobin A1c) level: 6-6.5%. Follow these instructions at home: Questions to ask your doctor  You may want to ask these questions: ? Do I need to meet with a diabetes educator? ? Where can I find a support group for people with diabetes? ? What equipment will I need to care for myself at home? ? What medicines do I need? When should I take them? ? How often do I need to check my blood sugar? ? What number can I call if I have questions? ? When is my next doctor's visit? General instructions  Take over-the-counter and prescription medicines only as told by your doctor.  Stay at a healthy weight during pregnancy.  Keep all follow-up visits as told by your doctor. This is important. Contact a doctor if:  Your blood sugar is at or above 240 mg/dL (13.3 mmol/L).  Your blood sugar is at or above 200 mg/dL (11.1 mmol/L), and you have ketones in your pee (urine).  You have been sick or have had a fever for 2 days or more and you are not getting  better.  You have any of these problems for more than 6 hours: ? You cannot eat or drink. ? You feel sick to your stomach (nauseous). ? You throw up (vomit). ? You have watery poop (diarrhea). Get help right away if:  Your blood sugar is lower than 54 mg/dL (3 mmol/L).  You get confused.  You have trouble: ? Thinking clearly. ? Breathing.  Your baby moves less than normal.  You have: ? Moderate or large ketone levels in your pee. ? Vaginal bleeding. ? Unusual fluid coming from your vagina. ? Early contractions. These may feel like tightness in your belly. Summary  Type 1 diabetes (type 1 diabetes mellitus) and type 2 diabetes (type 2 diabetes mellitus) are long-term (chronic) diseases.  If diabetes is treated, it may not hurt you or your baby.  Your doctor will set treatment goals for you. This information is not intended to replace advice given to you by your health care provider. Make sure you discuss any questions you have with your health care provider. Document Revised: 03/10/2019 Document Reviewed: 12/23/2015 Elsevier Patient Education  2020 [employer]  

## 2020-01-07 NOTE — Progress Notes (Signed)
   PRENATAL VISIT NOTE  Subjective:  Tiffany Reid is a 44 y.o. [phone] at [redacted]w[redacted]d being seen today for ongoing prenatal care.  She is currently monitored for the following issues for this high-risk pregnancy and has Gestational diabetes; AMA (advanced maternal age) multigravida 35+; History of gestational diabetes; Supervision of high risk pregnancy, antepartum; History of pre-eclampsia in prior pregnancy, currently pregnant; Genital herpes; GERD (gastroesophageal reflux disease); Vaginal discharge during pregnancy; Unwanted fertility; and HSV infection on their problem list.  Patient reports heartburn and nausea.  Contractions: Irritability. Vag. Bleeding: None.  Movement: Present. Denies leaking of fluid.   The following portions of the patient's history were reviewed and updated as appropriate: allergies, current medications, past family history, past medical history, past social history, past surgical history and problem list.   Objective:   Vitals:   01/07/20 0918  BP: 118/75  Pulse: 90  Weight: 253 lb (114.8 kg)    Fetal Status: Fetal Heart Rate (bpm): 160   Movement: Present  Presentation: Vertex  General:  Alert, oriented and cooperative. Patient is in no acute distress.  Skin: Skin is warm and dry. No rash noted.   Cardiovascular: Normal heart rate noted  Respiratory: Normal respiratory effort, no problems with respiration noted  Abdomen: Soft, gravid, appropriate for gestational age.  Pain/Pressure: Present     Pelvic: Cervical exam performed Dilation: 1.5 Effacement (%): 30 Station: Ballotable  Extremities: Normal range of motion.  Edema: Trace  Mental Status: Normal mood and affect. Normal behavior. Normal judgment and thought content.   Assessment and Plan:  Pregnancy: [id number] at [redacted]w[redacted]d 1. Supervision of high risk pregnancy, antepartum Routine testing - Tdap vaccine greater than or equal to 7yo IM - Culture, beta strep (group b only) - Cervicovaginal ancillary  only  2. Gestational diabetes mellitus (GDM) in third trimester, gestational diabetes method of control unspecified FBS up to 95-115, PP nl range, will increase insulin  3. Multigravida of advanced maternal age in third trimester   Preterm labor symptoms and general obstetric precautions including but not limited to vaginal bleeding, contractions, leaking of fluid and fetal movement were reviewed in detail with the patient. Please refer to After Visit Summary for other counseling recommendations.   Return in about 1 week (around 01/14/2020).  Future Appointments  Date Time Provider Department Center  01/07/2020  2:00 PM [facility] NURSE [facility]  01/07/2020  2:15 PM [facility] [place] 4 [facility]  01/13/2020  8:30 AM Adam Phenix, MD [facility] None    Scheryl Darter, MD

## 2020-01-08 ENCOUNTER — Other Ambulatory Visit: Payer: Self-pay

## 2020-01-08 ENCOUNTER — Encounter (HOSPITAL_COMMUNITY)
Admission: AD | Disposition: A | Discharge status: Home / Self Care | Payer: Self-pay | Source: Home / Self Care | Attending: Obstetrics & Gynecology

## 2020-01-08 ENCOUNTER — Encounter (HOSPITAL_COMMUNITY): Payer: Self-pay | Admitting: Diagnostic Radiology

## 2020-01-08 ENCOUNTER — Inpatient Hospital Stay (HOSPITAL_COMMUNITY): Payer: Medicaid Other | Admitting: Anesthesiology

## 2020-01-08 ENCOUNTER — Inpatient Hospital Stay (HOSPITAL_COMMUNITY)
Admission: AD | Admit: 2020-01-08 | Discharge: 2020-01-11 | DRG: 784 | Disposition: A | Discharge status: Home / Self Care | Payer: Medicaid Other | Attending: Obstetrics & Gynecology | Admitting: Obstetrics & Gynecology

## 2020-01-08 DIAGNOSIS — Z880 Allergy status to penicillin: Secondary | ICD-10-CM | POA: Diagnosis not present

## 2020-01-08 DIAGNOSIS — Z302 Encounter for sterilization: Secondary | ICD-10-CM

## 2020-01-08 DIAGNOSIS — O9962 Diseases of the digestive system complicating childbirth: Secondary | ICD-10-CM | POA: Diagnosis present

## 2020-01-08 DIAGNOSIS — O9832 Other infections with a predominantly sexual mode of transmission complicating childbirth: Secondary | ICD-10-CM | POA: Diagnosis present

## 2020-01-08 DIAGNOSIS — O24419 Gestational diabetes mellitus in pregnancy, unspecified control: Secondary | ICD-10-CM | POA: Diagnosis present

## 2020-01-08 DIAGNOSIS — O3663X0 Maternal care for excessive fetal growth, third trimester, not applicable or unspecified: Secondary | ICD-10-CM | POA: Diagnosis present

## 2020-01-08 DIAGNOSIS — O99824 Streptococcus B carrier state complicating childbirth: Secondary | ICD-10-CM

## 2020-01-08 DIAGNOSIS — Z20822 Contact with and (suspected) exposure to covid-19: Secondary | ICD-10-CM | POA: Diagnosis present

## 2020-01-08 DIAGNOSIS — K219 Gastro-esophageal reflux disease without esophagitis: Secondary | ICD-10-CM | POA: Diagnosis present

## 2020-01-08 DIAGNOSIS — O09529 Supervision of elderly multigravida, unspecified trimester: Secondary | ICD-10-CM

## 2020-01-08 DIAGNOSIS — Z3A36 36 weeks gestation of pregnancy: Secondary | ICD-10-CM

## 2020-01-08 DIAGNOSIS — O99214 Obesity complicating childbirth: Secondary | ICD-10-CM | POA: Diagnosis present

## 2020-01-08 DIAGNOSIS — Z3009 Encounter for other general counseling and advice on contraception: Secondary | ICD-10-CM

## 2020-01-08 DIAGNOSIS — O09299 Supervision of pregnancy with other poor reproductive or obstetric history, unspecified trimester: Secondary | ICD-10-CM

## 2020-01-08 DIAGNOSIS — Z9889 Other specified postprocedural states: Secondary | ICD-10-CM

## 2020-01-08 DIAGNOSIS — A6000 Herpesviral infection of urogenital system, unspecified: Secondary | ICD-10-CM | POA: Diagnosis present

## 2020-01-08 DIAGNOSIS — O09293 Supervision of pregnancy with other poor reproductive or obstetric history, third trimester: Secondary | ICD-10-CM

## 2020-01-08 DIAGNOSIS — O24424 Gestational diabetes mellitus in childbirth, insulin controlled: Secondary | ICD-10-CM | POA: Diagnosis present

## 2020-01-08 DIAGNOSIS — O26893 Other specified pregnancy related conditions, third trimester: Secondary | ICD-10-CM | POA: Diagnosis present

## 2020-01-08 LAB — URINALYSIS, ROUTINE W REFLEX MICROSCOPIC
Bilirubin Urine: NEGATIVE
Glucose, UA: NEGATIVE mg/dL
Ketones, ur: 20 mg/dL — AB
Leukocytes,Ua: NEGATIVE
Nitrite: NEGATIVE
Protein, ur: NEGATIVE mg/dL
Specific Gravity, Urine: 1.013 (ref 1.005–1.030)
pH: 6 (ref 5.0–8.0)

## 2020-01-08 LAB — TYPE AND SCREEN
ABO/RH(D): A POS
Antibody Screen: NEGATIVE

## 2020-01-08 LAB — CERVICOVAGINAL ANCILLARY ONLY
Bacterial Vaginitis (gardnerella): NEGATIVE
Candida Glabrata: NEGATIVE
Candida Vaginitis: NEGATIVE
Chlamydia: NEGATIVE
Comment: NEGATIVE
Comment: NEGATIVE
Comment: NEGATIVE
Comment: NEGATIVE
Comment: NEGATIVE
Comment: NORMAL
Neisseria Gonorrhea: NEGATIVE
Trichomonas: NEGATIVE

## 2020-01-08 LAB — CBC
HCT: 32.2 % — ABNORMAL LOW (ref 36.0–46.0)
Hemoglobin: 10.8 g/dL — ABNORMAL LOW (ref 12.0–15.0)
MCH: 30.4 pg (ref 26.0–34.0)
MCHC: 33.5 g/dL (ref 30.0–36.0)
MCV: 90.7 fL (ref 80.0–100.0)
Platelets: 243 10*3/uL (ref 150–400)
RBC: 3.55 MIL/uL — ABNORMAL LOW (ref 3.87–5.11)
RDW: 13.3 % (ref 11.5–15.5)
WBC: 10.6 10*3/uL — ABNORMAL HIGH (ref 4.0–10.5)
nRBC: 0 % (ref 0.0–0.2)

## 2020-01-08 LAB — GLUCOSE, CAPILLARY
Glucose-Capillary: 77 mg/dL (ref 70–99)
Glucose-Capillary: 94 mg/dL (ref 70–99)

## 2020-01-08 LAB — RESPIRATORY PANEL BY RT PCR (FLU A&B, COVID)
Influenza A by PCR: NEGATIVE
Influenza B by PCR: NEGATIVE
SARS Coronavirus 2 by RT PCR: NEGATIVE

## 2020-01-08 LAB — HEMOGLOBIN A1C
Hgb A1c MFr Bld: 6 % — ABNORMAL HIGH (ref 4.8–5.6)
Mean Plasma Glucose: 125.5 mg/dL

## 2020-01-08 SURGERY — Surgical Case
Anesthesia: Spinal | Site: Abdomen | Laterality: Bilateral | Wound class: Clean Contaminated

## 2020-01-08 MED ORDER — BUPIVACAINE HCL (PF) 0.5 % IJ SOLN
INTRAMUSCULAR | Status: AC
  Filled 2020-01-08: qty 30

## 2020-01-08 MED ORDER — FENTANYL CITRATE (PF) 100 MCG/2ML IJ SOLN
INTRAMUSCULAR | Status: DC | PRN
  Administered 2020-01-08: 15 ug via INTRATHECAL

## 2020-01-08 MED ORDER — BUPIVACAINE HCL (PF) 0.5 % IJ SOLN
INTRAMUSCULAR | Status: DC | PRN
  Administered 2020-01-08: 30 mL

## 2020-01-08 MED ORDER — OXYTOCIN 40 UNITS IN NORMAL SALINE INFUSION - SIMPLE MED: INTRAVENOUS | Status: DC | PRN

## 2020-01-08 MED ORDER — SOD CITRATE-CITRIC ACID 500-334 MG/5ML PO SOLN
30.0000 mL | Freq: Once | ORAL | Status: AC
  Administered 2020-01-08: 30 mL via ORAL
  Filled 2020-01-08: qty 30

## 2020-01-08 MED ORDER — PHENYLEPHRINE HCL-NACL 20-0.9 MG/250ML-% IV SOLN
INTRAVENOUS | Status: DC | PRN
  Administered 2020-01-08: 60 ug/min via INTRAVENOUS

## 2020-01-08 MED ORDER — OXYTOCIN 40 UNITS IN NORMAL SALINE INFUSION - SIMPLE MED
INTRAVENOUS | Status: AC
  Filled 2020-01-08: qty 1000

## 2020-01-08 MED ORDER — METFORMIN HCL 500 MG PO TABS
1000.0000 mg | ORAL_TABLET | Freq: Every day | ORAL | Status: DC
  Administered 2020-01-09: 1000 mg via ORAL
  Filled 2020-01-08 (×3): qty 2

## 2020-01-08 MED ORDER — FAMOTIDINE IN NACL 20-0.9 MG/50ML-% IV SOLN: 20.0000 mg | Freq: Once | INTRAVENOUS | Status: AC

## 2020-01-08 MED ORDER — BUPIVACAINE IN DEXTROSE 0.75-8.25 % IT SOLN
INTRATHECAL | Status: DC | PRN
  Administered 2020-01-08: 1.8 mL via INTRATHECAL

## 2020-01-08 MED ORDER — PHENYLEPHRINE HCL-NACL 20-0.9 MG/250ML-% IV SOLN
INTRAVENOUS | Status: AC
  Filled 2020-01-08: qty 250

## 2020-01-08 MED ORDER — CEFAZOLIN SODIUM-DEXTROSE 2-4 GM/100ML-% IV SOLN
2.0000 g | INTRAVENOUS | Status: AC
  Administered 2020-01-08: 2 g via INTRAVENOUS
  Filled 2020-01-08: qty 100

## 2020-01-08 MED ORDER — ONDANSETRON 4 MG PO TBDP
4.0000 mg | ORAL_TABLET | Freq: Once | ORAL | Status: AC
  Administered 2020-01-08: 4 mg via ORAL
  Filled 2020-01-08: qty 1

## 2020-01-08 MED ORDER — LACTATED RINGERS IV SOLN: INTRAVENOUS | Status: DC | PRN

## 2020-01-08 MED ORDER — SODIUM CHLORIDE 0.9 % IV SOLN: INTRAVENOUS | Status: DC | PRN

## 2020-01-08 MED ORDER — KETOROLAC TROMETHAMINE 30 MG/ML IJ SOLN
INTRAMUSCULAR | Status: AC
  Filled 2020-01-08: qty 1

## 2020-01-08 MED ORDER — SCOPOLAMINE 1 MG/3DAYS TD PT72
1.0000 | MEDICATED_PATCH | Freq: Once | TRANSDERMAL | Status: DC
  Administered 2020-01-08: 1.5 mg via TRANSDERMAL
  Filled 2020-01-08: qty 1

## 2020-01-08 MED ORDER — MORPHINE SULFATE (PF) 0.5 MG/ML IJ SOLN
INTRAMUSCULAR | Status: AC
  Filled 2020-01-08: qty 10

## 2020-01-08 MED ORDER — MEPERIDINE HCL 25 MG/ML IJ SOLN: 6.2500 mg | INTRAMUSCULAR | Status: DC | PRN

## 2020-01-08 MED ORDER — FENTANYL CITRATE (PF) 100 MCG/2ML IJ SOLN
INTRAMUSCULAR | Status: AC
  Filled 2020-01-08: qty 2

## 2020-01-08 MED ORDER — KETOROLAC TROMETHAMINE 30 MG/ML IJ SOLN
30.0000 mg | Freq: Four times a day (QID) | INTRAMUSCULAR | Status: AC | PRN
  Administered 2020-01-09: 30 mg via INTRAVENOUS
  Filled 2020-01-08: qty 1

## 2020-01-08 MED ORDER — FENTANYL CITRATE (PF) 100 MCG/2ML IJ SOLN
25.0000 ug | INTRAMUSCULAR | Status: DC | PRN
  Administered 2020-01-08: 25 ug via INTRAVENOUS

## 2020-01-08 MED ORDER — KETOROLAC TROMETHAMINE 30 MG/ML IJ SOLN
30.0000 mg | Freq: Four times a day (QID) | INTRAMUSCULAR | Status: AC | PRN
  Administered 2020-01-08: 30 mg via INTRAMUSCULAR

## 2020-01-08 MED ORDER — PHENYLEPHRINE HCL (PRESSORS) 10 MG/ML IV SOLN
INTRAVENOUS | Status: DC | PRN
  Administered 2020-01-08: 80 ug via INTRAVENOUS

## 2020-01-08 MED ORDER — MORPHINE SULFATE (PF) 0.5 MG/ML IJ SOLN
INTRAMUSCULAR | Status: DC | PRN
  Administered 2020-01-08: .15 mg via INTRATHECAL

## 2020-01-08 MED ORDER — ONDANSETRON HCL 4 MG/2ML IJ SOLN
INTRAMUSCULAR | Status: AC
  Filled 2020-01-08: qty 2

## 2020-01-08 MED ORDER — OXYTOCIN 10 UNIT/ML IJ SOLN
INTRAMUSCULAR | Status: DC | PRN
  Administered 2020-01-08: 40 [IU] via INTRAMUSCULAR

## 2020-01-08 MED ORDER — FAMOTIDINE IN NACL 20-0.9 MG/50ML-% IV SOLN
INTRAVENOUS | Status: AC
  Administered 2020-01-08: 20 mg via INTRAVENOUS
  Filled 2020-01-08: qty 50

## 2020-01-08 SURGICAL SUPPLY — 41 items
BARRIER ADHS 3X4 INTERCEED (GAUZE/BANDAGES/DRESSINGS) IMPLANT
BRR ADH 4X3 ABS CNTRL BYND (GAUZE/BANDAGES/DRESSINGS)
CHLORAPREP W/TINT 26ML (MISCELLANEOUS) ×4 IMPLANT
CLAMP CORD UMBIL (MISCELLANEOUS) IMPLANT
CLIP FILSHIE TUBAL LIGA STRL (Clip) ×3 IMPLANT
DRSG OPSITE POSTOP 4X10 (GAUZE/BANDAGES/DRESSINGS) ×4 IMPLANT
ELECT REM PT RETURN 9FT ADLT (ELECTROSURGICAL) ×4
ELECTRODE REM PT RTRN 9FT ADLT (ELECTROSURGICAL) ×2 IMPLANT
EXTRACTOR VACUUM KIWI (MISCELLANEOUS) IMPLANT
GLOVE BIO SURGEON STRL SZ 6.5 (GLOVE) ×3 IMPLANT
GLOVE BIO SURGEONS STRL SZ 6.5 (GLOVE) ×1
GLOVE BIOGEL PI IND STRL 7.0 (GLOVE) ×2 IMPLANT
GLOVE BIOGEL PI INDICATOR 7.0 (GLOVE) ×2
GOWN STRL REUS W/ TWL LRG LVL3 (GOWN DISPOSABLE) ×4 IMPLANT
GOWN STRL REUS W/TWL LRG LVL3 (GOWN DISPOSABLE) ×8
HEMOSTAT ARISTA ABSORB 3G PWDR (HEMOSTASIS) ×3 IMPLANT
KIT ABG SYR 3ML LUER SLIP (SYRINGE) IMPLANT
NDL HYPO 25X5/8 SAFETYGLIDE (NEEDLE) IMPLANT
NDL SPNL 18GX3.5 QUINCKE PK (NEEDLE) ×1 IMPLANT
NEEDLE HYPO 25X5/8 SAFETYGLIDE (NEEDLE) IMPLANT
NEEDLE SPNL 18GX3.5 QUINCKE PK (NEEDLE) ×4 IMPLANT
NS IRRIG 1000ML POUR BTL (IV SOLUTION) ×4 IMPLANT
PACK C SECTION WH (CUSTOM PROCEDURE TRAY) ×4 IMPLANT
PAD ABD 8X10 STRL (GAUZE/BANDAGES/DRESSINGS) ×3 IMPLANT
PAD OB MATERNITY 4.3X12.25 (PERSONAL CARE ITEMS) ×4 IMPLANT
PENCIL SMOKE EVAC W/HOLSTER (ELECTROSURGICAL) ×4 IMPLANT
SPONGE LAP 18X18 RF (DISPOSABLE) ×12 IMPLANT
SUT PDS AB 0 CTX 60 (SUTURE) IMPLANT
SUT VIC AB 0 CT1 27 (SUTURE)
SUT VIC AB 0 CT1 27XBRD ANBCTR (SUTURE) IMPLANT
SUT VIC AB 0 CT1 36 (SUTURE) IMPLANT
SUT VIC AB 2-0 CT1 27 (SUTURE) ×4
SUT VIC AB 2-0 CT1 TAPERPNT 27 (SUTURE) ×2 IMPLANT
SUT VIC AB 2-0 CTX 36 (SUTURE) ×11 IMPLANT
SUT VIC AB 3-0 CT1 27 (SUTURE) ×4
SUT VIC AB 3-0 CT1 TAPERPNT 27 (SUTURE) ×2 IMPLANT
SUT VIC AB 3-0 SH 27 (SUTURE)
SUT VIC AB 3-0 SH 27X BRD (SUTURE) IMPLANT
SYR 30ML LL (SYRINGE) ×4 IMPLANT
TOWEL OR 17X24 6PK STRL BLUE (TOWEL DISPOSABLE) ×4 IMPLANT
TRAY FOLEY BAG SILVER LF 14FR (SET/KITS/TRAYS/PACK) ×4 IMPLANT

## 2020-01-08 NOTE — H&P (Addendum)
Obstetric Preoperative History and Physical  Tiffany Reid is a 44 y.o. [id number] with IUP at [redacted]w[redacted]d presenting for scheduled cesarean section.  Reports good fetal movement, no bleeding, no contractions, no leaking of fluid.  No acute preoperative concerns.    Cesarean Section Indication: macrosomia with history of shoulder dystocia with brachial plexus injury; arrived laboring at [redacted]w[redacted]d  Prenatal Course Source of Care: [facility] Pregnancy complications or risks: Patient Active Problem List   Diagnosis Date Noted  . HSV infection 12/31/2019  . Unwanted fertility 11/05/2019  . Vaginal discharge during pregnancy 10/08/2019  . History of pre-eclampsia in prior pregnancy, currently pregnant 07/15/2019  . Supervision of high risk pregnancy, antepartum 06/15/2019  . History of gestational diabetes 03/22/2019  . AMA (advanced maternal age) multigravida 35+ 01/14/2019  . Gestational diabetes 12/23/2018  . Genital herpes 09/17/2017  . GERD (gastroesophageal reflux disease) 09/17/2017   She plans to bottle feed She desires bilateral tubal ligation for postpartum contraception.   Prenatal labs and studies: ABO, Rh: A/Positive/-- (08/12 1439) Antibody: Negative (08/12 1439) Rubella: 1.21 (08/12 1439) RPR: Non Reactive (12/03 1019)  HBsAg: Negative (08/12 1439)  HIV: Non Reactive (12/03 1019)  GBS:--/Negative (02/19 1117) 2 hr Glucola  abnormal Genetic screening increased risk for SMA Anatomy US normal  Prenatal Transfer Tool  Maternal Diabetes: Yes:  Diabetes Type:  Insulin/Medication controlled Genetic Screening: increased risk for SMA Maternal Ultrasounds/Referrals: Normal Fetal Ultrasounds or other Referrals:  None Maternal Substance Abuse:  No Significant Maternal Medications:  Zofran, Valtrex, NPH, Protonix Significant Maternal Lab Results: Other: GBS pending  Past Medical History:  Diagnosis Date  . Anemia   . Arthritis   . Chronic pain   . Diabetes mellitus without complication  ([place])   . Genital herpes   . GERD (gastroesophageal reflux disease) 09/17/2017  . Gestational diabetes   . Neuromuscular disorder ([place])   . Neuropathy   . Pregnancy induced hypertension   . Spinal headache     Past Surgical History:  Procedure Laterality Date  . DILATION AND CURETTAGE OF UTERUS      OB History  Gravida Para Term Preterm AB Living  7 3 3   3 3   SAB TAB Ectopic Multiple Live Births  3     0 3    # Outcome Date GA Lbr Len/2nd Weight Sex Delivery Anes PTL Lv  7 Current           6 Term 01/25/19 [redacted]w[redacted]d 15:10 / 00:10 4440 g F Vag-Spont None  LIV  5 Term 09/25/02 [redacted]w[redacted]d  3345 g F Vag-Spont   LIV  4 Term 08/28/97 [redacted]w[redacted]d  3742 g F Vag-Spont   LIV  3 SAB           2 SAB           1 SAB             Social History   Socioeconomic History  . Marital status: Single    Spouse name: Not on file  . Number of children: Not on file  . Years of education: Not on file  . Highest education level: Not on file  Occupational History  . Not on file  Tobacco Use  . Smoking status: Never Smoker  . Smokeless tobacco: Never Used  Substance and Sexual Activity  . Alcohol use: Not Currently    Comment: socially  . Drug use: No  . Sexual activity: Yes    Birth control/protection: None  Other Topics Concern  .  Not on file  Social History Narrative  . Not on file   Social Determinants of Health   Financial Resource Strain:   . Difficulty of Paying Living Expenses: Not on file  Food Insecurity:   . Worried About Programme researcher, broadcasting/film/video in the Last Year: Not on file  . Ran Out of Food in the Last Year: Not on file  Transportation Needs:   . Lack of Transportation (Medical): Not on file  . Lack of Transportation (Non-Medical): Not on file  Physical Activity:   . Days of Exercise per Week: Not on file  . Minutes of Exercise per Session: Not on file  Stress:   . Feeling of Stress : Not on file  Social Connections:   . Frequency of Communication with Friends and Family: Not on  file  . Frequency of Social Gatherings with Friends and Family: Not on file  . Attends Religious Services: Not on file  . Active Member of Clubs or Organizations: Not on file  . Attends Banker Meetings: Not on file  . Marital Status: Not on file    Family History  Problem Relation Age of Onset  . Diabetes Mother   . Miscarriages / [place] Mother   . Obesity Mother   . Kidney disease Father   . Obesity Father   . Heart disease Paternal Grandmother   . Hypertension Paternal Grandmother   . Obesity Paternal Grandmother   . Cancer Sister   . ADD / ADHD Daughter   . Asthma Daughter   . Obesity Maternal Aunt   . Obesity Maternal Uncle   . Obesity Paternal Aunt   . Obesity Paternal Uncle   . Arthritis Maternal Grandmother   . Diabetes Maternal Grandmother   . Obesity Maternal Grandmother   . Varicose Veins Maternal Grandmother   . Arthritis Maternal Grandfather   . Obesity Maternal Grandfather   . Obesity Paternal Grandfather     Medications Prior to Admission  Medication Sig Dispense Refill Last Dose  . aspirin EC 81 MG tablet Take 1 tablet (81 mg total) by mouth daily. Take after 12 weeks for prevention of preeclampsia later in pregnancy 300 tablet 2 01/07/2020 at Unknown time  . insulin NPH Human (HUMULIN N) 100 UNIT/ML injection Inject 0.28 mLs (28 Units total) into the skin at bedtime. 10 mL 2 01/07/2020 at Unknown time  . Prenatal Vit-Fe Fumarate-FA (PRENATAL MULTIVITAMIN) TABS tablet Take 1 tablet by mouth daily at 12 noon.   01/07/2020 at Unknown time  . valACYclovir (VALTREX) 500 MG tablet Take 1 tablet (500 mg total) by mouth 2 (two) times daily. 60 tablet 3 01/08/2020 at Unknown time  . cyclobenzaprine (FLEXERIL) 10 MG tablet Take 1 tablet (10 mg total) by mouth 3 (three) times daily as needed for muscle spasms. 30 tablet 2  at not taking  . DICLEGIS 10-10 MG TBEC TAKE 2 TABLETS BY MOUTH AT BEDTIME MAY TAKE 1 TABLET THE FOLLOWING MORNING AND 1 TABLET IN THE  AFTERNOON AS NEEDED    at not taking  . Elastic Bandages & Supports (COMFORT FIT MATERNITY SUPP LG) MISC Wear daily when ambulating 1 each 0   . pantoprazole (PROTONIX) 40 MG tablet Take 1 tablet (40 mg total) by mouth daily. (Patient not taking: Reported on 01/07/2020) 30 tablet 1   . terconazole (TERAZOL 7) 0.4 % vaginal cream Place 1 applicator vaginally at bedtime. (Patient not taking: Reported on 11/05/2019) 45 g 0     Allergies  Allergen Reactions  . Penicillins Itching and Swelling    Did it involve swelling of the face/tongue/throat, SOB, or low BP? Yes Did it involve sudden or severe rash/hives, skin peeling, or any reaction on the inside of your mouth or nose? Yes Did you need to seek medical attention at a hospital or doctor's office? Yes When did it last happen?44 years old If all above answers are "NO", may proceed with cephalosporin use.     Review of Systems: Pertinent items noted in HPI and remainder of comprehensive ROS otherwise negative.  Physical Exam: BP 115/72   Pulse 90   Temp 99.2 F (37.3 C) (Oral)   Resp 18   Ht 5\' 9"  (1.753 m)   Wt 114.8 kg   LMP 04/27/2019 (Exact Date)   SpO2 98%   BMI 37.36 kg/m  FHR by Doppler: 155 bpm CONSTITUTIONAL: Well-developed, well-nourished female in no acute distress.  HENT:  Normocephalic, atraumatic, External right and left ear normal. Oropharynx is clear and moist EYES: Conjunctivae and EOM are normal. No scleral icterus.  NECK: Normal range of motion, supple, no masses SKIN: Skin is warm and dry. No rash noted. Not diaphoretic. No erythema. No pallor. NEUROLGIC: Alert and oriented to person, place, and time. Normal reflexes, muscle tone coordination. No cranial nerve deficit noted. PSYCHIATRIC: Normal mood and affect. Normal behavior. Normal judgment and thought content. CARDIOVASCULAR: Normal heart rate noted RESPIRATORY: Effort and breath sounds normal, no problems with respiration noted ABDOMEN: Soft, nontender,  nondistended, gravid.  PELVIC: Deferred MUSCULOSKELETAL: Normal range of motion. No edema and no tenderness. 2+ distal pulses.   Pertinent Labs/Studies:   Results for orders placed or performed during the hospital encounter of 01/08/20 (from the past 72 hour(s))  Urinalysis, Routine w reflex microscopic     Status: Abnormal   Collection Time: 01/08/20  5:44 PM  Result Value Ref Range   Color, Urine YELLOW YELLOW   APPearance HAZY (A) CLEAR   Specific Gravity, Urine 1.013 1.005 - 1.030   pH 6.0 5.0 - 8.0   Glucose, UA NEGATIVE NEGATIVE mg/dL   Hgb urine dipstick MODERATE (A) NEGATIVE   Bilirubin Urine NEGATIVE NEGATIVE   Ketones, ur 20 (A) NEGATIVE mg/dL   Protein, ur NEGATIVE NEGATIVE mg/dL   Nitrite NEGATIVE NEGATIVE   Leukocytes,Ua NEGATIVE NEGATIVE   RBC / HPF 0-5 0 - 5 RBC/hpf   WBC, UA 0-5 0 - 5 WBC/hpf   Bacteria, UA FEW (A) NONE SEEN   Squamous Epithelial / LPF 6-10 0 - 5   Mucus PRESENT     Comment: Performed at [facility], [address], [place], [place]  Glucose, capillary     Status: None   Collection Time: 01/08/20  6:29 PM  Result Value Ref Range   Glucose-Capillary 77 70 - 99 mg/dL    Assessment and Plan: Kamorah Nevils is a 44 y.o. 46 at [redacted]w[redacted]d being admitted for cesarean section secondary to macrosomia and hx of shoulder dystocia with brachial plexus injury. Patient presents to MAU in labor.  The risks of cesarean section were discussed with the patient including but were not limited to: bleeding which may require transfusion or reoperation; infection which may require antibiotics; injury to bowel, bladder, ureters or other surrounding organs; injury to the fetus; need for additional procedures including hysterectomy in the event of a life-threatening hemorrhage; placental abnormalities wth subsequent pregnancies, incisional problems, thromboembolic phenomenon and other postoperative/anesthesia complications.  Patient also desires permanent  sterilization.  Other reversible forms of contraception were discussed with patient; she declines all other modalities. Risks of procedure discussed with patient including but not limited to: risk of regret, permanence of method, bleeding, infection, injury to surrounding organs and need for additional procedures.  Failure risk of about 1% with increased risk of ectopic gestation if pregnancy occurs was also discussed with patient.  Also discussed possibility of post-tubal pain syndrome. The patient concurred with the proposed plan, giving informed written consent for the procedures.  Patient has been NPO since 0930 she will remain NPO for procedure. Anesthesia and OR aware.  Preoperative prophylactic antibiotics and SCDs ordered on call to the OR.  To OR when ready.  Pregnancy Complications: GDMA2; poorly controlled; hx of genital herpes on Valtrex Contraception: Desires BTL; papers signed 12/3 Circumcision: Yes; outpt  Jerilynn Birkenhead, MD OB Family Medicine Fellow, [facility] for [employer], [facility]

## 2020-01-08 NOTE — Anesthesia Postprocedure Evaluation (Signed)
Anesthesia Post Note  Patient: Tiffany Reid  Procedure(s) Performed: CESAREAN SECTION WITH BILATERAL TUBAL LIGATION (Bilateral Abdomen)     Patient location during evaluation: PACU Anesthesia Type: Spinal Level of consciousness: oriented and awake and alert Pain management: pain level controlled Vital Signs Assessment: post-procedure vital signs reviewed and stable Respiratory status: spontaneous breathing, respiratory function stable and nonlabored ventilation Cardiovascular status: blood pressure returned to baseline and stable Postop Assessment: no headache, no backache, no apparent nausea or vomiting, spinal receding and patient able to bend at knees Anesthetic complications: no    Last Vitals:  Vitals:   01/08/20 2327 01/08/20 2330  BP:    Pulse: 78 72  Resp: 11 12  Temp:    SpO2: 100% 100%    Last Pain:  Vitals:   01/08/20 2315  TempSrc:   PainSc: 5    Pain Goal:    LLE Motor Response: Purposeful movement (01/08/20 2327) LLE Sensation: Increased (01/08/20 2327) RLE Motor Response: Purposeful movement (01/08/20 2327) RLE Sensation: Increased (01/08/20 2327)     Epidural/Spinal Function Cutaneous sensation: Vague (01/08/20 2327), Patient able to flex knees: Yes (01/08/20 2327), Patient able to lift hips off bed: No (01/08/20 2327), Back pain beyond tenderness at insertion site: No (01/08/20 2327), Progressively worsening motor and/or sensory loss: No (01/08/20 2327), Bowel and/or bladder incontinence post epidural: No (01/08/20 2327)  Estephanie Hubbs A.

## 2020-01-08 NOTE — MAU Provider Note (Signed)
History     CSN: [id number]  Arrival date and time: 01/08/20 1636   First Provider Initiated Contact with Patient 01/08/20 1809      Chief Complaint  Patient presents with  . Vaginal Bleeding  . Nausea   Tiffany Reid is a 44 y.o. [id number] at [redacted]w[redacted]d who receives care at [facility].  She presents today for Vaginal Bleeding and Nausea/Vomiting.  She states the N/V started Wednesday and she has not taken anything for it.  She states that she has acid reflux and she was prescribed something yesterday, but has not picked it up yet.  She denies feelings of reflux currently and endorses some mild nausea.  Patient reports vomiting prior to arrival.  Patient reports she had some light pink bleeding this morning, that has gotten darker brown throughout the day.  Patient endorses cervical exam yesterday in the office.  Patient also reports right side pain in her hip that radiates down to her leg.  Patient describes the pain as cramping with a shooting pain.  She reports it sometimes causes issues with standing and she is unable to lay on that side.  She endorses fetal movement, but states it is decreased and reports movement since arrival.  Patient also endorses contractions, but states she has been having those for weeks.  Patient denies vaginal discharge or leaking.      OB History    Gravida  7   Para  3   Term  3   Preterm      AB  3   Living  3     SAB  3   TAB      Ectopic      Multiple  0   Live Births  3           Past Medical History:  Diagnosis Date  . Anemia   . Arthritis   . Chronic pain   . Diabetes mellitus without complication ([facility])   . Genital herpes   . GERD (gastroesophageal reflux disease) 09/17/2017  . Gestational diabetes   . Neuromuscular disorder ([facility])   . Neuropathy   . Pregnancy induced hypertension   . Spinal headache     Past Surgical History:  Procedure Laterality Date  . DILATION AND CURETTAGE OF UTERUS      Family History  Problem  Relation Age of Onset  . Diabetes Mother   . Miscarriages / [place] Mother   . Obesity Mother   . Kidney disease Father   . Obesity Father   . Heart disease Paternal Grandmother   . Hypertension Paternal Grandmother   . Obesity Paternal Grandmother   . Cancer Sister   . ADD / ADHD Daughter   . Asthma Daughter   . Obesity Maternal Aunt   . Obesity Maternal Uncle   . Obesity Paternal Aunt   . Obesity Paternal Uncle   . Arthritis Maternal Grandmother   . Diabetes Maternal Grandmother   . Obesity Maternal Grandmother   . Varicose Veins Maternal Grandmother   . Arthritis Maternal Grandfather   . Obesity Maternal Grandfather   . Obesity Paternal Grandfather     Social History   Tobacco Use  . Smoking status: Never Smoker  . Smokeless tobacco: Never Used  Substance Use Topics  . Alcohol use: Not Currently    Comment: socially  . Drug use: No    Allergies:  Allergies  Allergen Reactions  . Penicillins Itching and Swelling    Did it involve swelling  of the face/tongue/throat, SOB, or low BP? Yes Did it involve sudden or severe rash/hives, skin peeling, or any reaction on the inside of your mouth or nose? Yes Did you need to seek medical attention at a hospital or doctor's office? Yes When did it last happen?44 years old If all above answers are "NO", may proceed with cephalosporin use.     Medications Prior to Admission  Medication Sig Dispense Refill Last Dose  . aspirin EC 81 MG tablet Take 1 tablet (81 mg total) by mouth daily. Take after 12 weeks for prevention of preeclampsia later in pregnancy 300 tablet 2 01/07/2020 at Unknown time  . insulin NPH Human (HUMULIN N) 100 UNIT/ML injection Inject 0.28 mLs (28 Units total) into the skin at bedtime. 10 mL 2 01/07/2020 at Unknown time  . Prenatal Vit-Fe Fumarate-FA (PRENATAL MULTIVITAMIN) TABS tablet Take 1 tablet by mouth daily at 12 noon.   01/07/2020 at Unknown time  . valACYclovir (VALTREX) 500 MG tablet Take 1  tablet (500 mg total) by mouth 2 (two) times daily. 60 tablet 3 01/08/2020 at Unknown time  . cyclobenzaprine (FLEXERIL) 10 MG tablet Take 1 tablet (10 mg total) by mouth 3 (three) times daily as needed for muscle spasms. 30 tablet 2  at not taking  . DICLEGIS 10-10 MG TBEC TAKE 2 TABLETS BY MOUTH AT BEDTIME MAY TAKE 1 TABLET THE FOLLOWING MORNING AND 1 TABLET IN THE AFTERNOON AS NEEDED    at not taking  . Elastic Bandages & Supports (COMFORT FIT MATERNITY SUPP LG) MISC Wear daily when ambulating 1 each 0   . pantoprazole (PROTONIX) 40 MG tablet Take 1 tablet (40 mg total) by mouth daily. (Patient not taking: Reported on 01/07/2020) 30 tablet 1   . terconazole (TERAZOL 7) 0.4 % vaginal cream Place 1 applicator vaginally at bedtime. (Patient not taking: Reported on 11/05/2019) 45 g 0     Review of Systems  Constitutional: Negative for chills and fever.  Respiratory: Negative for cough and shortness of breath.   Gastrointestinal: Positive for nausea and vomiting.  Genitourinary: Positive for vaginal bleeding. Negative for difficulty urinating, dysuria and vaginal discharge.  Neurological: Positive for headaches (Right side that "comes and goes."  None currently.). Negative for dizziness and light-headedness.   Physical Exam   Blood pressure 115/72, pulse 90, temperature 99.2 F (37.3 C), temperature source Oral, resp. rate 18, height 5\' 9"  (1.753 m), weight 114.8 kg, last menstrual period 04/27/2019, SpO2 98 %, not currently breastfeeding.  Physical Exam  Constitutional: She is oriented to person, place, and time. She appears well-developed and well-nourished. No distress.  HENT:  Head: Normocephalic and atraumatic.  Eyes: Conjunctivae are normal.  Cardiovascular: Normal rate.  Respiratory: Effort normal and breath sounds normal.  GI: Soft.  Appears LGA  Musculoskeletal:        General: Normal range of motion.     Cervical back: Normal range of motion.  Neurological: She is alert and  oriented to person, place, and time.  Skin: Skin is warm and dry.  Psychiatric: She has a normal mood and affect. Her behavior is normal.   Dilation: 4 Effacement (%): 50 Cervical Position: Middle Station: -3 Presentation: Vertex Exam by:: J Edvardo Honse CNM  Fetal Assessment 155 bpm, Mod Var, -Decels, +Accels Toco: Occasional  MAU Course   Results for orders placed or performed during the hospital encounter of 01/08/20 (from the past 24 hour(s))  Urinalysis, Routine w reflex microscopic     Status: Abnormal  Collection Time: 01/08/20  5:44 PM  Result Value Ref Range   Color, Urine YELLOW YELLOW   APPearance HAZY (A) CLEAR   Specific Gravity, Urine 1.013 1.005 - 1.030   pH 6.0 5.0 - 8.0   Glucose, UA NEGATIVE NEGATIVE mg/dL   Hgb urine dipstick MODERATE (A) NEGATIVE   Bilirubin Urine NEGATIVE NEGATIVE   Ketones, ur 20 (A) NEGATIVE mg/dL   Protein, ur NEGATIVE NEGATIVE mg/dL   Nitrite NEGATIVE NEGATIVE   Leukocytes,Ua NEGATIVE NEGATIVE   RBC / HPF 0-5 0 - 5 RBC/hpf   WBC, UA 0-5 0 - 5 WBC/hpf   Bacteria, UA FEW (A) NONE SEEN   Squamous Epithelial / LPF 6-10 0 - 5   Mucus PRESENT   Glucose, capillary     Status: None   Collection Time: 01/08/20  6:29 PM  Result Value Ref Range   Glucose-Capillary 77 70 - 99 mg/dL   No results found.  MDM PE Labs:UA, CBG EFM  Assessment and Plan  44 years old [id number]  SIUP at 36.4weeks Cat I FT Early Labor LGA Fetus Sciatica Pain Nausea  -Discussed treatment of nausea with zofran. -Reviewed vaginal bleeding likely bloody show from previous cervical exam. -Reassured that bloody show anticipated after exam and of no concern if self limiting. -Discussed right side pain is sciatica.  Informed that it may worsen as pregnancy progresses and PT may be necessary.  Encouraged to watch. -Will collect CBG -Discussed scheduling of IOL for uncontrolled GDM-A2 for early next week. -Of Note, recent growth at 4260g 9lbs 6oz (>99%ile). -Cervical  exam performed and findings discussed. -Informed that labor team would be contacted for further assessment. -Patient expresses desire for primary c/s to avoid repeat shoulder dystocia. -Dr. Elsie Ra contacted and at bedside.   Cherre Robins MSN, CNM 01/08/2020, 6:09 PM

## 2020-01-08 NOTE — Discharge Instructions (Signed)
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
## 2020-01-08 NOTE — Transfer of Care (Signed)
Immediate Anesthesia Transfer of Care Note  Patient: Tiffany Reid  Procedure(s) Performed: CESAREAN SECTION WITH BILATERAL TUBAL LIGATION (Bilateral Abdomen)  Patient Location: PACU  Anesthesia Type:Spinal  Level of Consciousness: awake, alert  and oriented  Airway & Oxygen Therapy: Patient Spontanous Breathing  Post-op Assessment: Report given to RN and Post -op Vital signs reviewed and stable  Post vital signs: Reviewed and stable  Last Vitals:  Vitals Value Taken Time  BP 102/52 01/08/20 2145  Temp 36.5 C 01/08/20 2133  Pulse 79 01/08/20 2146  Resp 13 01/08/20 2146  SpO2 97 % 01/08/20 2146  Vitals shown include unvalidated device data.  Last Pain:  Vitals:   01/08/20 1732  TempSrc: Oral  PainSc:          Complications: No apparent anesthesia complications

## 2020-01-08 NOTE — Discharge Summary (Signed)
Postpartum Discharge Summary     Patient Name: Brynlei Klausner DOB: 03-16-1976 MRN: [id number]  Date of admission: 01/08/2020 Delivering Provider: Emily Filbert   Date of discharge: 01/11/2020  Admitting diagnosis: Labor and delivery, indication for care [O75.9] Post-operative state [Z98.890] Intrauterine pregnancy: [redacted]w[redacted]d    Secondary diagnosis:  Active Problems:   Gestational diabetes   AMA (advanced maternal age) multigravida 35+   History of pre-eclampsia in prior pregnancy, currently pregnant   Unwanted fertility   Labor and delivery, indication for care   Post-operative state   H/O shoulder dystocia in prior pregnancy, currently pregnant  Additional problems: None     Discharge diagnosis: Term Pregnancy Delivered                                                                                                Post partum procedures: None  Augmentation: None  Complications: None  Hospital course:  Sceduled C/S   44y.o. yo [id number] admitted to the hospital 01/08/2020 for scheduled cesarean section with the following indication:Elective Primary due to macrosomia with history of shoulder dystocia with brachial plexus injury; arrived laboring at 3685w4d  Membrane Rupture Time/Date: 8:43 PM ,01/08/2020   Patient delivered a Viable infant.01/08/2020   Details of operation can be found in separate operative note.  Patient had an uncomplicated postpartum course.  She is ambulating, tolerating a regular diet, passing flatus, and urinating well. Patient is discharged home in stable condition on  01/11/20        Delivery time: 8:44 PM   Magnesium Sulfate received: No BMZ received: No Rhophylac:N/A MMR:N/A Transfusion:Yes  Physical exam  Vitals:   01/10/20 1040 01/10/20 1649 01/10/20 2115 01/11/20 0533  BP:  112/78 104/78 118/72  Pulse:  (!) 58  65  Resp:  _0 Temp:  98.6 F (37 C) 98.7 F (37.1 C) 98.2 F (36.8 C)  TempSrc:  Oral Oral Oral  SpO2: 98%  100%  100%  Weight:      Height:       General: alert, cooperative and no distress Lochia: appropriate Uterine Fundus: firm Incision: Healing well with no significant drainage DVT Evaluation: No evidence of DVT seen on physical exam. Labs: Lab Results  Component Value Date   WBC 12.2 (H) 01/09/2020   HGB 9.3 (L) 01/09/2020   HCT 28.8 (L) 01/09/2020   MCV 92.9 01/09/2020   PLT 214 01/09/2020   CMP Latest Ref Rng & Units 01/09/2020  Glucose 70 - 99 mg/dL 104(H)  BUN 6 - 20 mg/dL 8  Creatinine 0.44 - 1.00 mg/dL 0.68  Sodium 135 - 145 mmol/L 136  Potassium 3.5 - 5.1 mmol/L 3.6  Chloride 98 - 111 mmol/L 106  CO2 22 - 32 mmol/L 23  Calcium 8.9 - 10.3 mg/dL 8.4(L)  Total Protein 6.5 - 8.1 g/dL 5.1(L)  Total Bilirubin 0.3 - 1.2 mg/dL 0.3  Alkaline Phos 38 - 126 U/L 60  AST 15 - 41 U/L 28  ALT 0 - 44 U/L 17    Discharge instruction: per After Visit  Summary and "Baby and Me Booklet".  After visit meds:  Allergies as of 01/11/2020      Reactions   Penicillins Itching, Swelling   Did it involve swelling of the face/tongue/throat, SOB, or low BP? Yes Did it involve sudden or severe rash/hives, skin peeling, or any reaction on the inside of your mouth or nose? Yes Did you need to seek medical attention at a hospital or doctor's office? Yes When did it last happen?44 years old If all above answers are "NO", may proceed with cephalosporin use.      Medication List    STOP taking these medications   aspirin EC 81 MG tablet     TAKE these medications   acetaminophen 500 MG tablet Commonly known as: TYLENOL Take 2 tablets (1,000 mg total) by mouth every 6 (six) hours as needed.   Comfort Fit Maternity Supp Lg Misc Wear daily when ambulating   cyclobenzaprine 10 MG tablet Commonly known as: FLEXERIL Take 1 tablet (10 mg total) by mouth 3 (three) times daily as needed for muscle spasms.   Diclegis 10-10 MG Tbec Generic drug: Doxylamine-Pyridoxine TAKE 2 TABLETS BY MOUTH AT  BEDTIME MAY TAKE 1 TABLET THE FOLLOWING MORNING AND 1 TABLET IN THE AFTERNOON AS NEEDED   ibuprofen 800 MG tablet Commonly known as: ADVIL Take 1 tablet (800 mg total) by mouth every 8 (eight) hours as needed.   insulin NPH Human 100 UNIT/ML injection Commonly known as: HumuLIN N Inject 0.28 mLs (28 Units total) into the skin at bedtime.   oxyCODONE 5 MG immediate release tablet Commonly known as: Roxicodone Take 1 tablet (5 mg total) by mouth every 8 (eight) hours as needed.   pantoprazole 40 MG tablet Commonly known as: Protonix Take 1 tablet (40 mg total) by mouth daily.   prenatal multivitamin Tabs tablet Take 1 tablet by mouth daily at 12 noon.   terconazole 0.4 % vaginal cream Commonly known as: TERAZOL 7 Place 1 applicator vaginally at bedtime.   valACYclovir 500 MG tablet Commonly known as: VALTREX Take 1 tablet (500 mg total) by mouth 2 (two) times daily.       Diet: routine diet  Activity: Advance as tolerated. Pelvic rest for 6 weeks.   Outpatient follow up:4 weeks Follow up Appt: Future Appointments  Date Time Provider [place]  01/25/2020  1:15 PM [place] None  02/22/2020  8:15 AM Anyanwu, Sallyanne Havers, MD [facility] None  02/22/2020  8:45 AM [facility] None   Follow up Visit: Please schedule this patient for Postpartum visit in: 4 weeks with the following provider: Any provider (in person for GTT and incision check) For C/S patients schedule nurse incision check in weeks 2 weeks: yes High risk pregnancy complicated by: GDM, HSV, Hx Pre-E Delivery mode:  CS Anticipated Birth Control:  BTL done PP PP Procedures needed: Incision check, 2 hour GTT Schedule Integrated BH visit: no  Newborn Data: Live born female  Birth Weight: 9 lb 5.6 oz (4241 g) APGAR: 8, 9  Newborn Delivery   Birth date/time: 01/08/2020 20:44:00 Delivery type: C-Section, Low Transverse Trial of labor: No C-section categorization: Primary      Baby  Feeding: Bottle Disposition:home with mother   01/11/2020 Merilyn Baba, DO

## 2020-01-08 NOTE — Op Note (Signed)
01/08/2020  9:15 PM  PATIENT:  Tiffany Reid  44 y.o. female  PRE-OPERATIVE DIAGNOSIS:   Suspected macrosomia, history of previous shoulder dystocia, morbid obesity, unwanted fertility  POST-OPERATIVE DIAGNOSIS:  same  PROCEDURE:  Procedure(s): CESAREAN SECTION WITH BILATERAL TUBAL LIGATION (Bilateral)  SURGEON:  Surgeon(s) and Role:    * Miri Jose C, MD - Primary    * Sparacino, Hailey L, DO - Fellow  ANESTHESIA:   local and spinal  EBL:   750 cc by my estimation   BLOOD ADMINISTERED:none  DRAINS: Urinary Catheter (Foley)   LOCAL MEDICATIONS USED:  MARCAINE     SPECIMEN:  Source of Specimen:  cord blood  DISPOSITION OF SPECIMEN:  PATHOLOGY  COUNTS:  YES  TOURNIQUET:  * No tourniquets in log *  DICTATION: .Dragon Dictation  PLAN OF CARE: Admit to inpatient   PATIENT DISPOSITION:  PACU - hemodynamically stable.   Delay start of Pharmacological VTE agent (>24hrs) due to surgical blood loss or risk of bleeding: not applicable  The risks, benefits, and alternatives of surgery were explained, understood, accepted. Consents were signed. All questions were answered. Spinal anesthesia was given.  Her abdomen and vagina were prepped and draped in the usual sterile fashion. A Foley catheter was placed, draining clear urine throughout the case. Timeout procedure was done. After adequate anesthesia was assured, 30 mL of 0.5% Marcaine was injected into the subcutaneous tissue about 2 cm above the symphysis pubis. An incision was made there. The incision was carried down through the subcutaneous tissue to the fascia. The fascia was scored the midline and extended bilaterally. The rectus fascia was separated from the rectus muscles. Excellent hemostasis was maintained. The peritoneum was entered with hemostats. Peritoneal incision was extended bilaterally.. The bladder blade was placed. A transverse incision was made on the well-developed lower uterine segment. The uterine incision was  extended with traction on each side.  Clear fluid was noted with amniotomy. The baby was delivered from a vertex lie with a OT presentation. The baby's cord was clamped and cut after a 1 minute standard delay, and she was transferred to the NICU personnel for routine care. The placenta was delivered intact with traction. The uterus was left in situ and the interior was cleaned with a dry lap sponge. The uterine incision was closed with 2-0 Vicryl running locking suture in 2 layers, the second layer imbricating the first. Excellent hemostasis was achieved after placing a figure of eight suture near the left corner.  By tilting the uterus each side was able to visualize the adnexa, and they were normal. A Filsche clip was placed across each tube in the isthmic region. Arista was placed over the uterine incision.The rectus fascia rectus muscles were noted be hemostatic as well. The fascia was closed with a 0 Vicryl suture in a running nonlocking fashion. No defects were palpable.  A subcuticular closure was done with a 2-0 vicryl suture.   Excellent cosmetic results were obtained. She was taken to the recovery room in stable condition. She tolerated the procedure well.

## 2020-01-08 NOTE — MAU Note (Addendum)
Pt reporting vaginal bleeding since this morning. It was light pink when she wiped, but is dark now. Also has had nausea and vomiting since Wednesday night. Denies LOF, feels decreased fetal movement.  Was 1.5-2cm at prenatal visit yesterday.

## 2020-01-08 NOTE — Anesthesia Procedure Notes (Signed)
Spinal  Patient location during procedure: OR Start time: 01/08/2020 8:23 PM End time: 01/08/2020 8:26 PM Staffing Performed: anesthesiologist  Anesthesiologist: Mal Amabile, MD Preanesthetic Checklist Completed: patient identified, IV checked, site marked, risks and benefits discussed, surgical consent, monitors and equipment checked, pre-op evaluation and timeout performed Spinal Block Patient position: sitting Prep: DuraPrep and site prepped and draped Patient monitoring: heart rate, cardiac monitor, continuous pulse ox and blood pressure Approach: midline Location: L3-4 Injection technique: single-shot Needle Needle type: Pencan  Needle gauge: 24 G Needle length: 9 cm Needle insertion depth: 7 cm Assessment Sensory level: T4 Additional Notes Patient tolerated procedure well. Adequate sensory level.

## 2020-01-09 DIAGNOSIS — O09299 Supervision of pregnancy with other poor reproductive or obstetric history, unspecified trimester: Secondary | ICD-10-CM

## 2020-01-09 DIAGNOSIS — Z9889 Other specified postprocedural states: Secondary | ICD-10-CM

## 2020-01-09 HISTORY — DX: Supervision of pregnancy with other poor reproductive or obstetric history, unspecified trimester: O09.299

## 2020-01-09 LAB — COMPREHENSIVE METABOLIC PANEL
ALT: 17 U/L (ref 0–44)
AST: 28 U/L (ref 15–41)
Albumin: 2.1 g/dL — ABNORMAL LOW (ref 3.5–5.0)
Alkaline Phosphatase: 60 U/L (ref 38–126)
Anion gap: 7 (ref 5–15)
BUN: 8 mg/dL (ref 6–20)
CO2: 23 mmol/L (ref 22–32)
Calcium: 8.4 mg/dL — ABNORMAL LOW (ref 8.9–10.3)
Chloride: 106 mmol/L (ref 98–111)
Creatinine, Ser: 0.68 mg/dL (ref 0.44–1.00)
GFR calc Af Amer: >60 mL/min (ref >60)
GFR calc non Af Amer: >60 mL/min (ref >60)
Glucose, Bld: 104 mg/dL — ABNORMAL HIGH (ref 70–99)
Potassium: 3.6 mmol/L (ref 3.5–5.1)
Sodium: 136 mmol/L (ref 135–145)
Total Bilirubin: 0.3 mg/dL (ref 0.3–1.2)
Total Protein: 5.1 g/dL — ABNORMAL LOW (ref 6.5–8.1)

## 2020-01-09 LAB — CBC
HCT: 28.8 % — ABNORMAL LOW (ref 36.0–46.0)
Hemoglobin: 9.3 g/dL — ABNORMAL LOW (ref 12.0–15.0)
MCH: 30 pg (ref 26.0–34.0)
MCHC: 32.3 g/dL (ref 30.0–36.0)
MCV: 92.9 fL (ref 80.0–100.0)
Platelets: 214 10*3/uL (ref 150–400)
RBC: 3.1 MIL/uL — ABNORMAL LOW (ref 3.87–5.11)
RDW: 13.6 % (ref 11.5–15.5)
WBC: 12.2 10*3/uL — ABNORMAL HIGH (ref 4.0–10.5)
nRBC: 0 % (ref 0.0–0.2)

## 2020-01-09 LAB — GLUCOSE, CAPILLARY
Glucose-Capillary: 81 mg/dL (ref 70–99)
Glucose-Capillary: 96 mg/dL (ref 70–99)
Glucose-Capillary: 99 mg/dL (ref 70–99)
Glucose-Capillary: 183 mg/dL — ABNORMAL HIGH (ref 70–99)

## 2020-01-09 LAB — RPR: RPR Ser Ql: NONREACTIVE

## 2020-01-09 LAB — CREATININE, SERUM
Creatinine, Ser: 0.65 mg/dL (ref 0.44–1.00)
GFR calc Af Amer: >60 mL/min (ref >60)
GFR calc non Af Amer: >60 mL/min (ref >60)

## 2020-01-09 MED ORDER — DIPHENHYDRAMINE HCL 25 MG PO CAPS: 25.0000 mg | ORAL_CAPSULE | Freq: Four times a day (QID) | ORAL | Status: DC | PRN

## 2020-01-09 MED ORDER — SIMETHICONE 80 MG PO CHEW
80.0000 mg | CHEWABLE_TABLET | ORAL | Status: DC
  Administered 2020-01-10 (×2): 80 mg via ORAL
  Filled 2020-01-09 (×2): qty 1

## 2020-01-09 MED ORDER — LACTATED RINGERS IV SOLN: INTRAVENOUS | Status: AC

## 2020-01-09 MED ORDER — MENTHOL 3 MG MT LOZG: 1.0000 | LOZENGE | OROMUCOSAL | Status: DC | PRN

## 2020-01-09 MED ORDER — LACTATED RINGERS IV BOLUS
1000.0000 mL | Freq: Once | INTRAVENOUS | Status: AC
  Administered 2020-01-09: 1000 mL via INTRAVENOUS

## 2020-01-09 MED ORDER — ONDANSETRON HCL 4 MG/2ML IJ SOLN
INTRAMUSCULAR | Status: DC | PRN
  Administered 2020-01-08: 4 mg via INTRAVENOUS

## 2020-01-09 MED ORDER — COCONUT OIL OIL: 1.0000 "application " | TOPICAL_OIL | Status: DC | PRN

## 2020-01-09 MED ORDER — NALBUPHINE HCL 10 MG/ML IJ SOLN: 5.0000 mg | Freq: Once | INTRAMUSCULAR | Status: DC | PRN

## 2020-01-09 MED ORDER — DIPHENHYDRAMINE HCL 50 MG/ML IJ SOLN: 12.5000 mg | INTRAMUSCULAR | Status: DC | PRN

## 2020-01-09 MED ORDER — SODIUM CHLORIDE 0.9% FLUSH: 3.0000 mL | INTRAVENOUS | Status: DC | PRN

## 2020-01-09 MED ORDER — ONDANSETRON HCL 4 MG/2ML IJ SOLN
4.0000 mg | Freq: Three times a day (TID) | INTRAMUSCULAR | Status: DC | PRN
  Administered 2020-01-09: 4 mg via INTRAVENOUS
  Filled 2020-01-09: qty 2

## 2020-01-09 MED ORDER — ENOXAPARIN SODIUM 60 MG/0.6ML ~~LOC~~ SOLN
0.5000 mg/kg | SUBCUTANEOUS | Status: DC
  Administered 2020-01-09 – 2020-01-10 (×2): 55 mg via SUBCUTANEOUS
  Filled 2020-01-09 (×2): qty 0.6

## 2020-01-09 MED ORDER — LACTATED RINGERS IV SOLN: INTRAVENOUS | Status: DC

## 2020-01-09 MED ORDER — NALBUPHINE HCL 10 MG/ML IJ SOLN: 5.0000 mg | INTRAMUSCULAR | Status: DC | PRN

## 2020-01-09 MED ORDER — WITCH HAZEL-GLYCERIN EX PADS: 1.0000 "application " | MEDICATED_PAD | CUTANEOUS | Status: DC | PRN

## 2020-01-09 MED ORDER — SIMETHICONE 80 MG PO CHEW
80.0000 mg | CHEWABLE_TABLET | Freq: Three times a day (TID) | ORAL | Status: DC
  Administered 2020-01-09 – 2020-01-11 (×5): 80 mg via ORAL
  Filled 2020-01-09 (×5): qty 1

## 2020-01-09 MED ORDER — IBUPROFEN 800 MG PO TABS
800.0000 mg | ORAL_TABLET | Freq: Three times a day (TID) | ORAL | Status: DC
  Administered 2020-01-09 – 2020-01-10 (×3): 800 mg via ORAL
  Filled 2020-01-09 (×4): qty 1

## 2020-01-09 MED ORDER — DIPHENHYDRAMINE HCL 25 MG PO CAPS: 25.0000 mg | ORAL_CAPSULE | ORAL | Status: DC | PRN

## 2020-01-09 MED ORDER — DIBUCAINE (PERIANAL) 1 % EX OINT: 1.0000 "application " | TOPICAL_OINTMENT | CUTANEOUS | Status: DC | PRN

## 2020-01-09 MED ORDER — OXYTOCIN 40 UNITS IN NORMAL SALINE INFUSION - SIMPLE MED
2.5000 [IU]/h | INTRAVENOUS | Status: AC
  Administered 2020-01-09: 2.5 [IU]/h via INTRAVENOUS

## 2020-01-09 MED ORDER — NALOXONE HCL 4 MG/10ML IJ SOLN
1.0000 ug/kg/h | INTRAVENOUS | Status: DC | PRN
  Filled 2020-01-09: qty 5

## 2020-01-09 MED ORDER — SIMETHICONE 80 MG PO CHEW: 80.0000 mg | CHEWABLE_TABLET | ORAL | Status: DC | PRN

## 2020-01-09 MED ORDER — PRENATAL MULTIVITAMIN CH
1.0000 | ORAL_TABLET | Freq: Every day | ORAL | Status: DC
  Administered 2020-01-09: 1 via ORAL
  Filled 2020-01-09: qty 1

## 2020-01-09 MED ORDER — ZOLPIDEM TARTRATE 5 MG PO TABS: 5.0000 mg | ORAL_TABLET | Freq: Every evening | ORAL | Status: DC | PRN

## 2020-01-09 MED ORDER — NALOXONE HCL 0.4 MG/ML IJ SOLN: 0.4000 mg | INTRAMUSCULAR | Status: DC | PRN

## 2020-01-09 MED ORDER — SENNOSIDES-DOCUSATE SODIUM 8.6-50 MG PO TABS
2.0000 | ORAL_TABLET | ORAL | Status: DC
  Administered 2020-01-10 (×2): 2 via ORAL
  Filled 2020-01-09 (×2): qty 2

## 2020-01-09 MED ORDER — OXYCODONE-ACETAMINOPHEN 5-325 MG PO TABS
1.0000 | ORAL_TABLET | ORAL | Status: DC | PRN
  Administered 2020-01-09: 1 via ORAL
  Administered 2020-01-10 – 2020-01-11 (×5): 2 via ORAL
  Filled 2020-01-09: qty 2
  Filled 2020-01-09: qty 1
  Filled 2020-01-09 (×4): qty 2

## 2020-01-09 MED ORDER — TETANUS-DIPHTH-ACELL PERTUSSIS 5-2.5-18.5 LF-MCG/0.5 IM SUSP: 0.5000 mL | Freq: Once | INTRAMUSCULAR | Status: DC

## 2020-01-09 NOTE — Progress Notes (Signed)
Subjective: Postpartum Day #1 : Cesarean Delivery Patient reports tolerating PO; sore belly. Has not had bowel movement or flatus yet.    Foley catheter still in place.  Objective: Vital signs in last 24 hours: Temp:  [97.6 F (36.4 C)-99.2 F (37.3 C)] 97.8 F (36.6 C) (02/06 0928) Pulse Rate:  [72-90] 78 (02/06 0300) Resp:  [10-21] 20 (02/06 0928) BP: (94-123)/(52-77) 104/67 (02/06 0300) SpO2:  [97 %-100 %] 100 % (02/06 0932) Weight:  [114.8 kg] 114.8 kg (02/05 1732)  Physical Exam:  General: alert, cooperative and no distress Lochia: appropriate Uterine Fundus: firm Incision: dressing is clean, dry and intact.  DVT Evaluation: No evidence of DVT seen on physical exam.  Recent Labs    01/08/20 1917 01/09/20 0434  HGB 10.8* 9.3*  HCT 32.2* 28.8*    Assessment/Plan: Status post Cesarean section. Doing well postoperatively.  Continue current care.  Charlesetta Garibaldi Marisa Hufstetler 01/09/2020, 12:30 PM

## 2020-01-09 NOTE — Progress Notes (Signed)
Output has been 600 ml since 1400. Pt feeling better, tolerating food and beverage. Update called to CMN with approval to remove foley catheter, keep IV access.

## 2020-01-09 NOTE — Progress Notes (Signed)
Patient's urinary output has been 150 ml in the last 7 hours following LR 1000 ml bolus this am @ 0700 and LR at 125 ml/hr. Patient has been nauseated. Vomited twice during the night. Has only ate ice today and is feeling better now, tolerating po intake. Foley draining well. Has been up to bathroom for foley and peri care. Bladder scan indicates 0 ml in bladder. MD notified orders obtained.

## 2020-01-10 ENCOUNTER — Encounter (HOSPITAL_COMMUNITY): Payer: Self-pay | Admitting: Obstetrics & Gynecology

## 2020-01-10 LAB — CULTURE, BETA STREP (GROUP B ONLY): Strep Gp B Culture: POSITIVE — AB

## 2020-01-10 LAB — GLUCOSE, CAPILLARY
Glucose-Capillary: 99 mg/dL (ref 70–99)
Glucose-Capillary: 97 mg/dL (ref 70–99)
Glucose-Capillary: 63 mg/dL — ABNORMAL LOW (ref 70–99)

## 2020-01-10 MED ORDER — KETOROLAC TROMETHAMINE 10 MG PO TABS
10.0000 mg | ORAL_TABLET | Freq: Four times a day (QID) | ORAL | Status: DC
  Administered 2020-01-10 – 2020-01-11 (×5): 10 mg via ORAL
  Filled 2020-01-10 (×5): qty 1

## 2020-01-10 MED ORDER — FERROUS SULFATE 325 (65 FE) MG PO TABS
325.0000 mg | ORAL_TABLET | Freq: Every day | ORAL | Status: DC
  Administered 2020-01-10 – 2020-01-11 (×2): 325 mg via ORAL
  Filled 2020-01-10 (×2): qty 1

## 2020-01-10 NOTE — Progress Notes (Addendum)
CBG=63 prior to dinner. Iven Finn CNM aware, no treatment (just have patient eat her dinner tray). CNM is discontinuing Metformin.

## 2020-01-10 NOTE — Progress Notes (Addendum)
POSTPARTUM PROGRESS NOTE  Post Partum Day 2  Subjective:  Tiffany Reid is a 44 y.o. [id number] s/p pLTCS for macrosomia, GDM A2 on POD#2. At [redacted]w[redacted]d.  She reports she is doing well. No acute events overnight. She denies any problems with ambulating, voiding or po intake. Denies nausea or vomiting.  Pain is well controlled.  Lochia is mild.  Objective: Blood pressure 108/61, pulse 65, temperature 98.5 F (36.9 C), temperature source Oral, resp. rate 18, height 5\' 9"  (1.753 m), weight 114.8 kg, last menstrual period 04/27/2019, SpO2 100 %, unknown if currently breastfeeding.  Physical Exam:  General: alert, cooperative and no distress Chest: no respiratory distress Heart:regular rate, distal pulses intact Abdomen: soft, nontender Uterine Fundus: firm, appropriately tender DVT Evaluation: No calf swelling or tenderness Extremities: no edema Skin: warm, dry  Recent Labs    01/08/20 1917 01/09/20 0434  HGB 10.8* 9.3*  HCT 32.2* 28.8*    Assessment/Plan: Tiffany Reid is a 44 y.o. 48 s/p pLTCS for macrosomia, GDM A2 on POD#2. At [redacted]w[redacted]d.  PPD#2 - Doing well. Continue routine postpartum care.  Contraception: pending BTL  Feeding: Bottle Dispo: Plan for discharge possibly today if she is amenable.   LOS: 2 days    [redacted]w[redacted]d, DO, PGY-1 OBGYN Faculty Teaching Service  01/10/2020, 9:02 AM  C/O cramping pain 6/10. FF U/2, appropriately tender. Abd soft. No bleeding around pressure dressing. No fever, chills. Lochia small. Passing flatus. Likely 2/2 multiparity and pain meds being due now. Will change IBU to Toradol and apply heat.   I repeated the exam and agree with above.  8/10, Katrinka Blazing, CNM 01/10/2020 11:51 AM

## 2020-01-10 NOTE — Progress Notes (Signed)
Patient refused CBG. Previous CBG was 63 but patient has eaten dinner since then and metformin was discontinued. Says she was told that we would not be checking them anymore. Encouraged patient to let me know if she started to feel bad. Will continue to monitor.

## 2020-01-11 ENCOUNTER — Encounter: Payer: Self-pay | Admitting: *Deleted

## 2020-01-11 MED ORDER — ACETAMINOPHEN 500 MG PO TABS: 1000.0000 mg | ORAL_TABLET | Freq: Four times a day (QID) | ORAL | 2 refills | Status: DC | PRN

## 2020-01-11 MED ORDER — OXYCODONE HCL 5 MG PO TABS: 5.0000 mg | ORAL_TABLET | Freq: Three times a day (TID) | ORAL | 0 refills | Status: DC | PRN

## 2020-01-11 MED ORDER — IBUPROFEN 800 MG PO TABS: 800.0000 mg | ORAL_TABLET | Freq: Three times a day (TID) | ORAL | 0 refills | Status: AC | PRN

## 2020-01-13 ENCOUNTER — Encounter: Payer: Medicaid Other | Admitting: Obstetrics & Gynecology

## 2020-01-15 ENCOUNTER — Ambulatory Visit (HOSPITAL_COMMUNITY): Payer: Medicaid Other

## 2020-01-15 ENCOUNTER — Encounter (HOSPITAL_COMMUNITY): Payer: Self-pay

## 2020-01-22 ENCOUNTER — Ambulatory Visit (HOSPITAL_COMMUNITY): Payer: Medicaid Other

## 2020-01-25 ENCOUNTER — Other Ambulatory Visit: Payer: Self-pay

## 2020-01-25 ENCOUNTER — Ambulatory Visit (INDEPENDENT_AMBULATORY_CARE_PROVIDER_SITE_OTHER): Payer: Medicaid Other | Admitting: Obstetrics

## 2020-01-25 DIAGNOSIS — Z9889 Other specified postprocedural states: Secondary | ICD-10-CM

## 2020-01-25 NOTE — Progress Notes (Addendum)
Subjective:     Tiffany Reid is a 44 y.o. female who presents to the clinic 2 weeks status post LT C-Section for Incision Check. Eating a regular diet without difficulty. Bowel movements are normal. Pain is controlled with current analgesics. Medications being used: acetaminophen.    Review of Systems Pertinent items noted in HPI and remainder of comprehensive ROS otherwise negative.    Objective:    BP 136/87   Pulse 65   Temp 98 F (36.7 C)   Ht 5\' 9"  (1.753 m)   Wt 231 lb 11.2 oz (105.1 kg)   BMI 34.22 kg/m  General:  alert  Abdomen: soft, bowel sounds active, non-tender  Incision:   healing well, no drainage, no erythema, no hernia, no seroma, no swelling, no dehiscence, incision well approximated     Assessment:    Doing well postoperatively.   Plan:    1. Continue any current medications. 2. Wound care discussed. 3. Activity restrictions: none 4. Anticipated return to work: not applicable. 5. Follow up: 4 weeks for Postpartum Exams and Labs.   , RMA   Patient seen and assessed by nursing staff during this encounter. I have reviewed the chart and agree with the documentation and plan.  Maretta Bees, MD 01/25/2020 1:54 PM

## 2020-02-01 ENCOUNTER — Inpatient Hospital Stay (HOSPITAL_COMMUNITY): Admission: RE | Admit: 2020-02-01 | Payer: Medicaid Other | Source: Home / Self Care

## 2020-02-22 ENCOUNTER — Encounter: Payer: Self-pay | Admitting: Obstetrics & Gynecology

## 2020-02-22 ENCOUNTER — Other Ambulatory Visit: Payer: Medicaid Other

## 2020-02-22 ENCOUNTER — Ambulatory Visit (INDEPENDENT_AMBULATORY_CARE_PROVIDER_SITE_OTHER): Payer: Medicaid Other | Admitting: Obstetrics & Gynecology

## 2020-02-22 ENCOUNTER — Other Ambulatory Visit: Payer: Self-pay

## 2020-02-22 DIAGNOSIS — Z8632 Personal history of gestational diabetes: Secondary | ICD-10-CM

## 2020-02-22 DIAGNOSIS — Z1231 Encounter for screening mammogram for malignant neoplasm of breast: Secondary | ICD-10-CM

## 2020-02-22 NOTE — Progress Notes (Signed)
    Post Partum Visit Note  Tiffany Reid is a 44 y.o. [phone] female who presents for a postpartum visit. She is 6 weeks postpartum following a primary cesarean section.  I have fully reviewed the prenatal and intrapartum course. The delivery was at 36 4/7 gestational weeks; patient was laboring and had planned for cesarean delivery given fetal macrosomia with history of fetal shoulder dystocia complicated by brachial plexus injury.  Anesthesia: spinal. Postpartum course has been uncomplicated. Baby is doing well. Baby is feeding by bottle - [employer] Good Start. Bleeding staining only. Bowel function is normal. Bladder function is normal. Patient is not sexually active. Contraception method is tubal ligation. Postpartum depression screening: negative. (score:0).  The following portions of the patient's history were reviewed and updated as appropriate: allergies, current medications, past family history, past medical history, past social history, past surgical history and problem list.  Review of Systems Pertinent items noted in HPI and remainder of comprehensive ROS otherwise negative.    Objective:  Blood pressure 128/84, pulse 78, weight 236 lb 14.4 oz (107.5 kg), last menstrual period 02/19/2020, not currently breastfeeding.  General:  alert and no distress   Breasts:  deferred  Lungs: clear to auscultation bilaterally  Heart:  regular rate and rhythm  Abdomen: soft, non-tender; bowel sounds normal; no masses,  no organomegaly.  Incision is well-healed, no C/D/I, no erythema, drainage.  Pelvic:  not evaluated        Assessment:   Normal postpartum exam.   Plan:   Essential components of care per ACOG recommendations:  1.  Mood and well being: Patient with negative depression screening today. Reviewed local resources for support.  - Patient does not use tobacco.  - History of drug use? No   2. Infant care and feeding:  -Patient currently breastmilk feeding? No  -Social  determinants of health (SDOH) reviewed in EPIC. No concerns  3. Sexuality, contraception and birth spacing - Patient had a tubal ligation.    4. Sleep and fatigue -Encouraged family/partner/community support of 4 hrs of uninterrupted sleep to help with mood and fatigue  5. Physical Recovery  - Discussed patients delivery - Patient has urinary incontinence? No - Patient is safe to resume physical and sexual activity  6.  Health Maintenance - Last pap smear done 09/08/18 and was normal with negative HPV. - Mammogram ordered today  7. History of GDM - 2 hr GTT done today, will follow up results and manage accordingly. - Will follow up with [facility] for primary care.  Jaynie Collins, MD [facility] [employer], [facility]

## 2020-02-23 ENCOUNTER — Encounter: Payer: Self-pay | Admitting: Obstetrics & Gynecology

## 2020-02-23 LAB — GLUCOSE TOLERANCE, 2 HOURS
Glucose, 2 hour: 86 mg/dL (ref 65–139)
Glucose, GTT - Fasting: 92 mg/dL (ref 65–99)

## 2020-03-01 NOTE — Progress Notes (Signed)
Return to work letter provided for patient per request.

## 2020-03-11 ENCOUNTER — Other Ambulatory Visit: Payer: Self-pay

## 2020-03-11 ENCOUNTER — Ambulatory Visit
Admission: RE | Admit: 2020-03-11 | Discharge: 2020-03-11 | Disposition: A | Discharge status: Home / Self Care | Payer: Medicaid Other | Source: Ambulatory Visit | Attending: Obstetrics & Gynecology | Admitting: Obstetrics & Gynecology

## 2020-03-11 DIAGNOSIS — Z1231 Encounter for screening mammogram for malignant neoplasm of breast: Secondary | ICD-10-CM

## 2020-03-14 ENCOUNTER — Other Ambulatory Visit: Payer: Self-pay | Admitting: Obstetrics & Gynecology

## 2020-03-14 DIAGNOSIS — R928 Other abnormal and inconclusive findings on diagnostic imaging of breast: Secondary | ICD-10-CM

## 2020-03-28 ENCOUNTER — Ambulatory Visit
Admission: RE | Admit: 2020-03-28 | Discharge: 2020-03-28 | Disposition: A | Discharge status: Home / Self Care | Payer: Medicaid Other | Source: Ambulatory Visit | Attending: Obstetrics & Gynecology | Admitting: Obstetrics & Gynecology

## 2020-03-28 ENCOUNTER — Other Ambulatory Visit: Payer: Self-pay | Admitting: Obstetrics & Gynecology

## 2020-03-28 ENCOUNTER — Other Ambulatory Visit: Payer: Self-pay

## 2020-03-28 DIAGNOSIS — R928 Other abnormal and inconclusive findings on diagnostic imaging of breast: Secondary | ICD-10-CM

## 2020-03-28 DIAGNOSIS — N6320 Unspecified lump in the left breast, unspecified quadrant: Secondary | ICD-10-CM

## 2020-04-06 ENCOUNTER — Other Ambulatory Visit: Payer: Self-pay

## 2020-04-06 ENCOUNTER — Ambulatory Visit (INDEPENDENT_AMBULATORY_CARE_PROVIDER_SITE_OTHER): Payer: Medicaid Other | Admitting: Family Medicine

## 2020-04-06 ENCOUNTER — Encounter: Payer: Self-pay | Admitting: Family Medicine

## 2020-04-06 VITALS — BP 106/68 | HR 85 | Ht 69.0 in | Wt 237.2 lb

## 2020-04-06 DIAGNOSIS — G44209 Tension-type headache, unspecified, not intractable: Secondary | ICD-10-CM | POA: Insufficient documentation

## 2020-04-06 DIAGNOSIS — M654 Radial styloid tenosynovitis [de Quervain]: Secondary | ICD-10-CM | POA: Diagnosis not present

## 2020-04-06 DIAGNOSIS — M659 Synovitis and tenosynovitis, unspecified: Secondary | ICD-10-CM | POA: Diagnosis not present

## 2020-04-06 DIAGNOSIS — K148 Other diseases of tongue: Secondary | ICD-10-CM

## 2020-04-06 DIAGNOSIS — Z8632 Personal history of gestational diabetes: Secondary | ICD-10-CM

## 2020-04-06 NOTE — Assessment & Plan Note (Signed)
History and physical exam consistent with de Quervain's tenosynovitis. -Thumb splint for 3 weeks -Follow-up in clinic if no improvement.  Consider hand surgery referral if no improvement

## 2020-04-06 NOTE — Patient Instructions (Addendum)
It was great to meet you today Tiffany Reid. Here's a quick summary of the important points.  Gestational Diabetes:  We will continue to monitor this every year.  DeQuarvian's Synovitis:  I have provided a prescription for a thumb splint.  You can take this to a medical supply store [facility] medical supply) to have it filled. Use this splint during the day during activities that require a lot of wrist/thumb movement.  Let's check in in 3 weeks to see how you are doing.  You may use 2 splints but I think you only really need it for your left thumb.

## 2020-04-06 NOTE — Assessment & Plan Note (Signed)
We will continue to monitor him with annual A1c.  Last A1c of 6.0 on 01/2020

## 2020-04-06 NOTE — Assessment & Plan Note (Signed)
Does not seem to be causing any active problems. -Continue to monitor

## 2020-04-06 NOTE — Progress Notes (Signed)
SUBJECTIVE:   CHIEF COMPLAINT / HPI: Tiffany Reid presented to clinic today for new patient encounter and to discuss her thumb pain.  Gestational diabetes She was diagnosed with gestational diabetes during her recent pregnancy.  During her pregnancy, this required the use of insulin.  She has been off of insulin since her delivery.  She had an A1c of 6.0 on 01/08/2020.  GERD Her heartburn also seem to be primarily during pregnancy.  Since her delivery, she has not had significant issues with heartburn no longer takes her PPI.  HSV She has previously been diagnosed with genital herpes based on blood work.  She does not recall ever having an outbreak.  She is not currently experiencing any symptoms.  Headaches She notes that she currently has daily headaches at about 1:00 every day.  This is been going on for at least the past month.  These headaches are generally a mild throb and she often does not take any medication at all.  Tongue polyp Physical exam reveals a tongue polyp on the right border of her tongue.  She notes that she think this began when she bit her tongue within the past year and is grown as a result.  It does not bother her and does not drain.  De Quervain synovitis She began to experience some wrist pain during her pregnancy which was ultimately diagnosed as carpal tunnel syndrome.  She has been using wrist splints since that diagnosis but continues to have significant thumb pain especially in the left thumb.  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  PERTINENT  PMH / PSH: C-section on 01/08/2020 for macrosomia.  OBJECTIVE:   BP 106/68   Pulse 85   Ht 5\' 9"  (1.753 m)   Wt 237 lb 3.2 oz (107.6 kg)   LMP 03/14/2020 (Exact Date)   SpO2 99%   BMI 35.03 kg/m    General: Alert and cooperative and appears to be in no acute distress HEENT: A 0.5 cm polyp was noted along the right border of her tongue.  There is no evidence of  purulence, bleeding or discharge.  See image below. Cardio: Normal S1 and S2, no S3 or S4. Rhythm is regular. No murmurs or rubs.   Pulm: Clear to auscultation bilaterally, no crackles, wheezing, or diminished breath sounds. Normal respiratory effort Abdomen: Bowel sounds normal. Abdomen soft and non-tender.  Extremities: No peripheral edema. Warm/ well perfused.  Strong radial and pedal pulses. Neuro: Cranial nerves grossly intact  Left wrist Inspection: No gross deformities or abnormalities Palpation: Significant tenderness to palpation over the anatomical snuffbox. Range of motion: Full active range of motion Neurovascularly intact Positive Finkelstein test.  Possible reproduction of the pain with Phalen's test.  Reproducible pain with thumb abduction against resistance.  Media Information   Document Information  Photos    04/06/2020 16:36  Attached To:  Jola Babinski  Source Information  Matilde Haymaker, MD  [facility]     ASSESSMENT/PLAN:   History of gestational diabetes We will continue to monitor him with annual A1c.  Last A1c of 6.0 on 01/2020  Tenosynovitis, de Quervain History and physical exam consistent with de Quervain's tenosynovitis. -Thumb splint for 3 weeks -Follow-up in clinic if no improvement.  Consider hand surgery referral if no improvement  Tongue lesion Does not seem to be causing any active problems. -Continue to monitor  Tension type headache Not particularly bothersome at this  time.  She often does not take any medication for it because does not trouble her.  She is encouraged to return to clinic if this grew worse.  Her history is suspicious for a medication overuse headache due to her frequent use of Tylenol and Motrin for her thumb pain.     Mirian Mo, MD [facility]

## 2020-04-06 NOTE — Assessment & Plan Note (Signed)
Not particularly bothersome at this time.  She often does not take any medication for it because does not trouble her.  She is encouraged to return to clinic if this grew worse.  Her history is suspicious for a medication overuse headache due to her frequent use of Tylenol and Motrin for her thumb pain.

## 2020-04-08 ENCOUNTER — Ambulatory Visit
Admission: RE | Admit: 2020-04-08 | Discharge: 2020-04-08 | Disposition: A | Discharge status: Home / Self Care | Payer: Medicaid Other | Source: Ambulatory Visit | Attending: Obstetrics & Gynecology | Admitting: Obstetrics & Gynecology

## 2020-04-08 ENCOUNTER — Other Ambulatory Visit: Payer: Self-pay

## 2020-04-08 DIAGNOSIS — N6320 Unspecified lump in the left breast, unspecified quadrant: Secondary | ICD-10-CM

## 2020-11-03 ENCOUNTER — Encounter: Payer: Self-pay | Admitting: Family Medicine

## 2020-11-03 MED ORDER — VALACYCLOVIR HCL 500 MG PO TABS: 500.0000 mg | ORAL_TABLET | Freq: Two times a day (BID) | ORAL | 3 refills | Status: DC

## 2020-11-14 ENCOUNTER — Encounter: Payer: Self-pay | Admitting: General Practice

## 2020-12-20 ENCOUNTER — Other Ambulatory Visit: Payer: Self-pay

## 2020-12-20 ENCOUNTER — Ambulatory Visit (INDEPENDENT_AMBULATORY_CARE_PROVIDER_SITE_OTHER): Payer: Medicaid Other | Admitting: Family Medicine

## 2020-12-20 ENCOUNTER — Ambulatory Visit
Admission: RE | Admit: 2020-12-20 | Discharge: 2020-12-20 | Disposition: A | Discharge status: Home / Self Care | Payer: Medicaid Other | Source: Ambulatory Visit | Attending: Family Medicine | Admitting: Family Medicine

## 2020-12-20 ENCOUNTER — Encounter: Payer: Self-pay | Admitting: Family Medicine

## 2020-12-20 VITALS — BP 138/82 | HR 73 | Wt 234.8 lb

## 2020-12-20 DIAGNOSIS — G8929 Other chronic pain: Secondary | ICD-10-CM

## 2020-12-20 DIAGNOSIS — Z1159 Encounter for screening for other viral diseases: Secondary | ICD-10-CM

## 2020-12-20 DIAGNOSIS — M545 Low back pain, unspecified: Secondary | ICD-10-CM

## 2020-12-20 DIAGNOSIS — Z5320 Procedure and treatment not carried out because of patient's decision for unspecified reasons: Secondary | ICD-10-CM | POA: Diagnosis not present

## 2020-12-20 DIAGNOSIS — Z791 Long term (current) use of non-steroidal anti-inflammatories (NSAID): Secondary | ICD-10-CM | POA: Diagnosis not present

## 2020-12-20 NOTE — Assessment & Plan Note (Addendum)
This seems to be mild worsening of her chronic symptoms.  No new symptoms today with the exception of this mild tenderness to percussion of her lower thoracic/upper lumbar spine.  She has previously had for lumbar spine which is available under care everywhere.  This was last performed in 2017.  At some point, it may be beneficial to have repeat MR imaging although I do not think that today's presentation is concerning enough for reimaging. -H. pylori testing due to likely long-term NSAID use -Mobic 15 mg daily -Voltaren gel 4 times daily as needed -Placed referral to physical therapy -Follow-up lumbar x-ray -Follow-up BMP
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
## 2020-12-20 NOTE — Patient Instructions (Signed)
It was great to see you today.  Here is a quick review of the things we talked about:   Back pain: I am sorry to hear that your back pain seems to be worsening again.  I am glad that it does not seem to be keeping you from any activities at this point.  We are going to do a couple of things for her back pain today: -We will get a back x-ray.  You can walk-in to [facility] imaging to get this done at your convenience -We will screen for that stomach microbe to make sure that will not be a problem -Start Mobic 15 mg daily -Voltaren gel 4 times daily as needed -I have placed a referral to physical therapy you should get a call in the next 1-2 weeks to set up your appointment.  Please let me know if you have not received a call in the next 2 weeks.  If all of your labs are normal, I will send you a message over my chart or send you a letter.  If there is anything to discuss, I will give you a phone call.

## 2020-12-21 LAB — BASIC METABOLIC PANEL
BUN/Creatinine Ratio: 17 (ref 9–23)
BUN: 12 mg/dL (ref 6–24)
CO2: 23 mmol/L (ref 20–29)
Calcium: 9.5 mg/dL (ref 8.7–10.2)
Chloride: 104 mmol/L (ref 96–106)
Creatinine, Ser: 0.72 mg/dL (ref 0.57–1.00)
GFR calc Af Amer: 119 mL/min/{1.73_m2} (ref >59)
GFR calc non Af Amer: 104 mL/min/{1.73_m2} (ref >59)
Glucose: 85 mg/dL (ref 65–99)
Potassium: 4 mmol/L (ref 3.5–5.2)
Sodium: 139 mmol/L (ref 134–144)

## 2020-12-21 LAB — HEPATITIS C ANTIBODY: Hep C Virus Ab: 0.1 s/co ratio (ref 0.0–0.9)

## 2020-12-21 LAB — H. PYLORI BREATH TEST: H pylori Breath Test: NEGATIVE

## 2020-12-22 ENCOUNTER — Encounter: Payer: Self-pay | Admitting: Family Medicine

## 2020-12-22 DIAGNOSIS — M51379 Other intervertebral disc degeneration, lumbosacral region without mention of lumbar back pain or lower extremity pain: Secondary | ICD-10-CM | POA: Insufficient documentation

## 2020-12-22 DIAGNOSIS — M5137 Other intervertebral disc degeneration, lumbosacral region: Secondary | ICD-10-CM | POA: Insufficient documentation

## 2021-01-11 ENCOUNTER — Ambulatory Visit: Payer: Medicaid Other | Attending: Family Medicine | Admitting: Physical Therapy

## 2021-02-01 IMAGING — MG DIGITAL SCREENING BILAT W/ TOMO W/ CAD
6 of 12 series · 6 of 36 positions shown · non-contrast
Comparison: None.

CLINICAL DATA: Screening.

EXAM:
DIGITAL SCREENING BILATERAL MAMMOGRAM WITH TOMO AND CAD

[L MLO synth-2D]
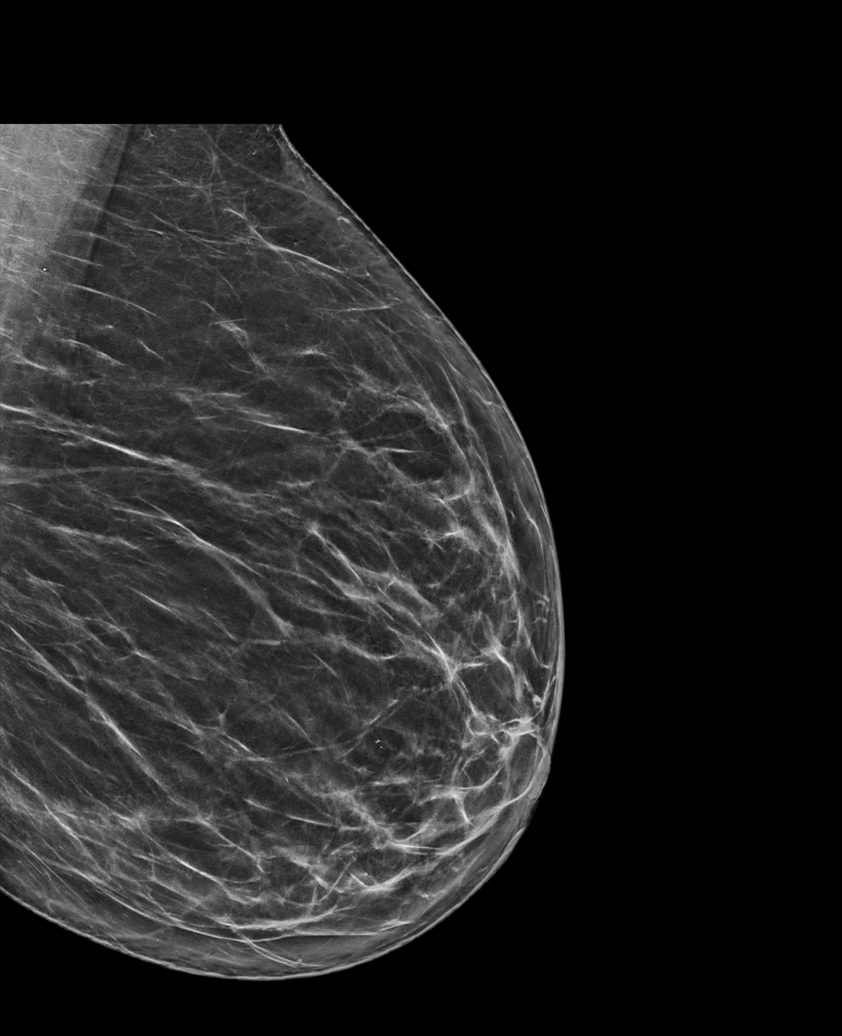

[L CC synth-2D (1 of 2)]
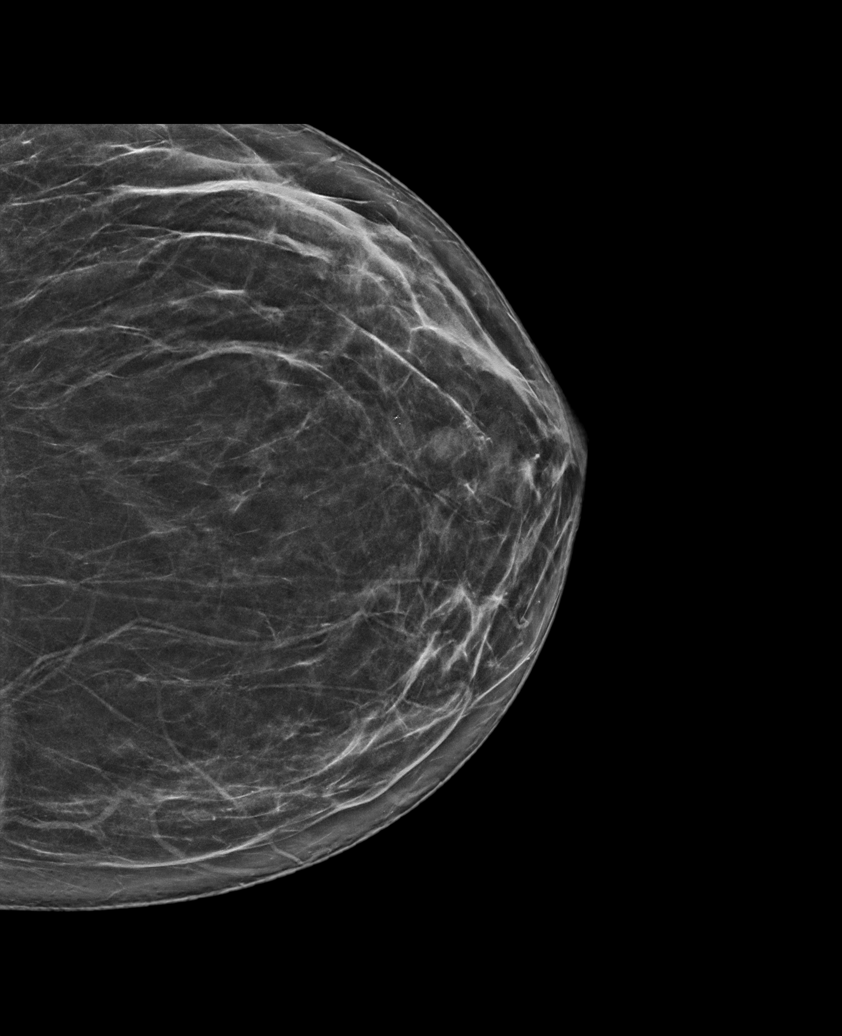

[R CC synth-2D]
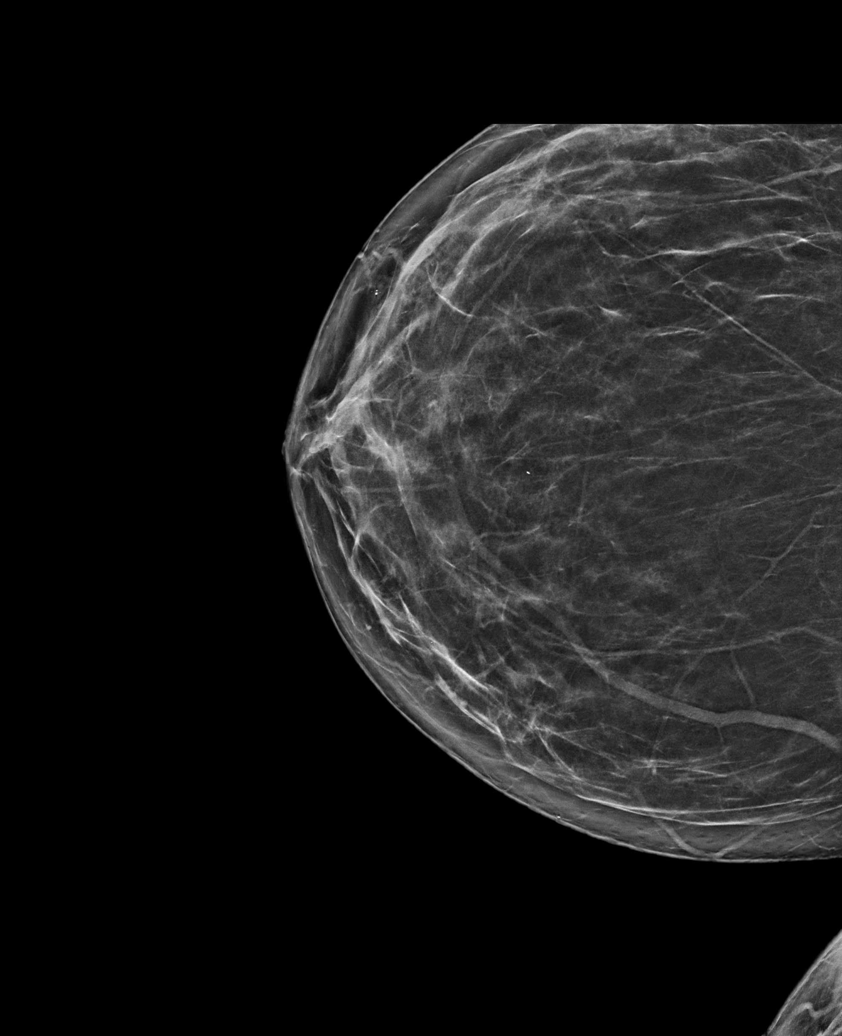

[R MLO synth-2D (1 of 2)]
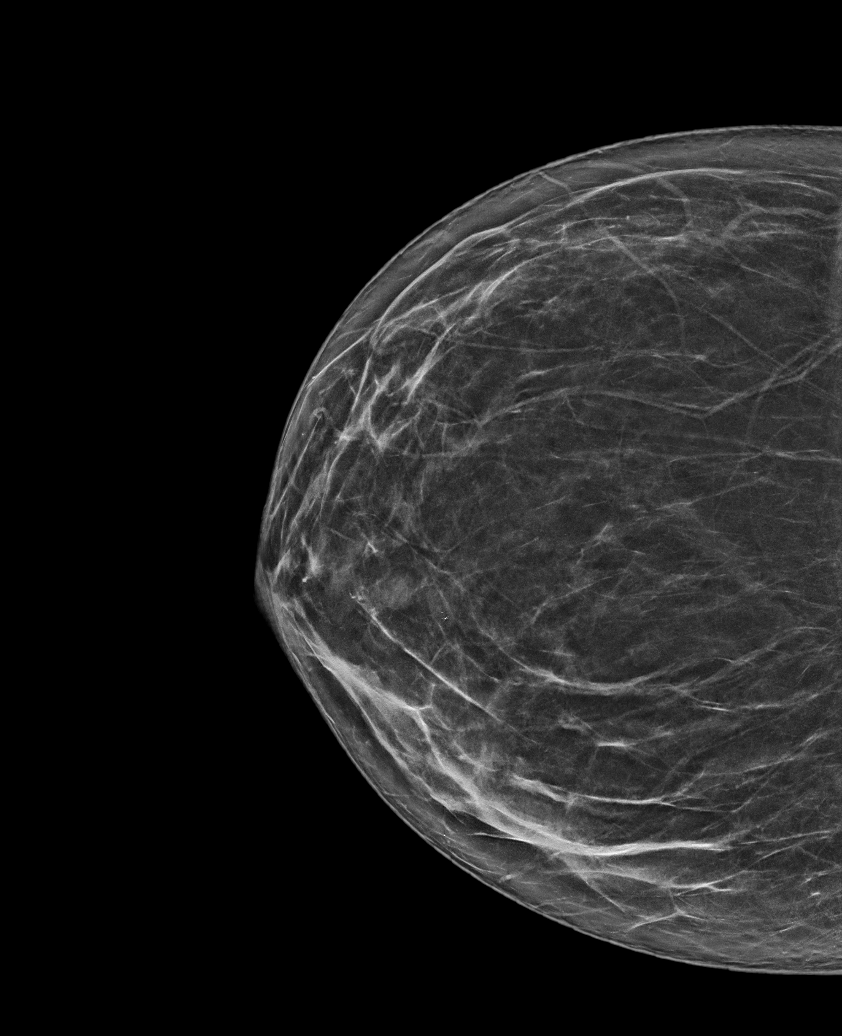

[L CC synth-2D (2 of 2)]
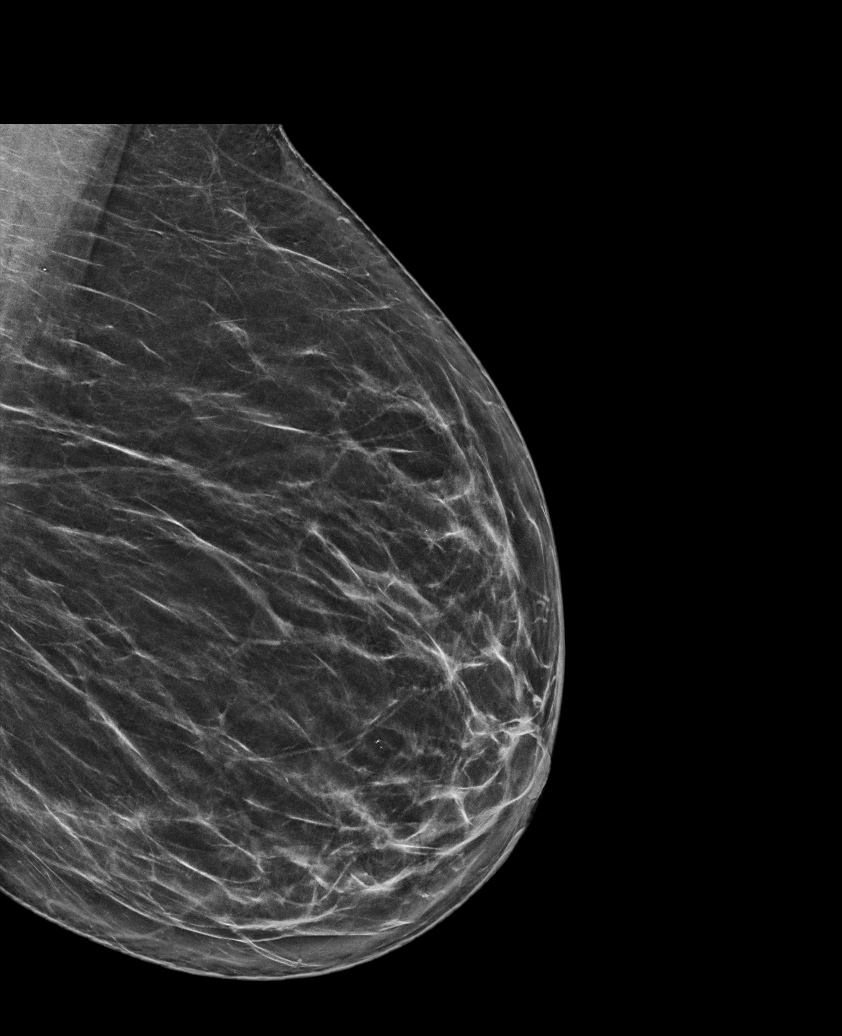

[R MLO synth-2D (2 of 2)]
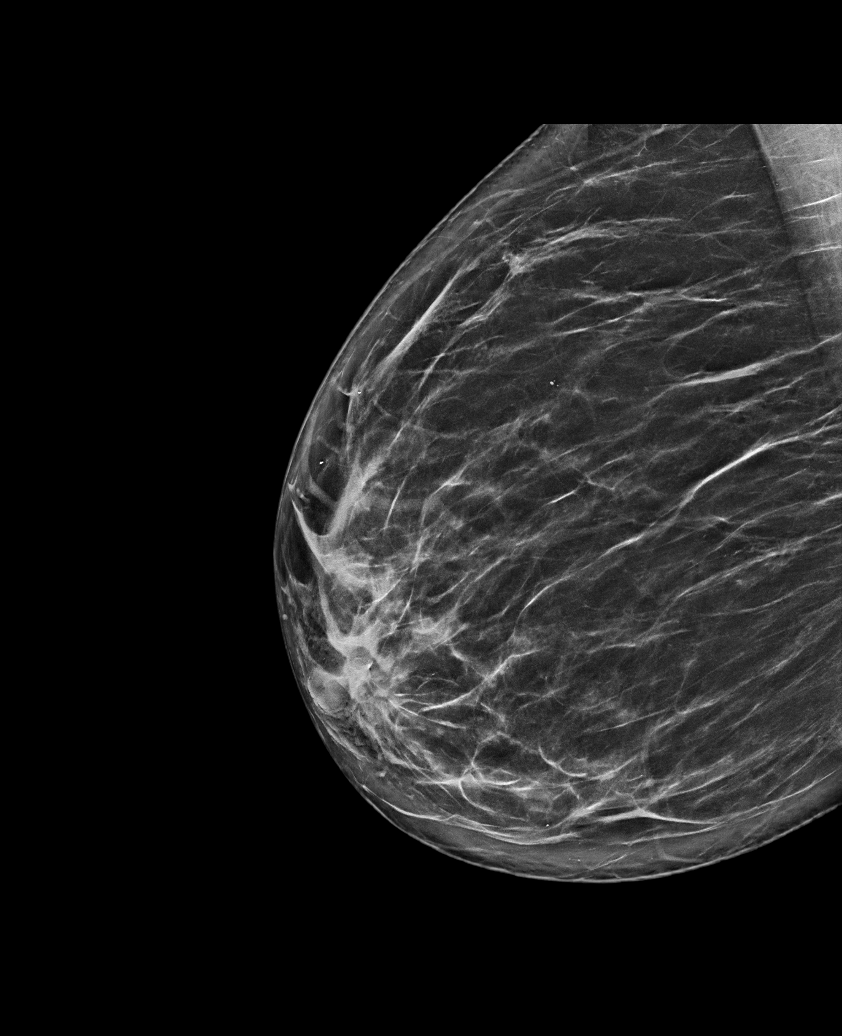

[6 of 36 positions shown; findings below may reference images not displayed]

ACR Breast Density Category b: There are scattered areas of
fibroglandular density.
FINDINGS: In both breasts, possible masses warrant further evaluation. Images
were processed with CAD.
IMPRESSION: Further evaluation is suggested for possible masses in the right and
left breasts.

RECOMMENDATION:
Diagnostic mammogram and possibly ultrasound of the right and left
breasts. (Code:[id number])

The patient will be contacted regarding the findings, and additional
imaging will be scheduled.

BI-RADS CATEGORY  0: Incomplete. Need additional imaging evaluation
and/or prior mammograms for comparison.

## 2021-02-22 ENCOUNTER — Ambulatory Visit (INDEPENDENT_AMBULATORY_CARE_PROVIDER_SITE_OTHER): Payer: Medicaid Other

## 2021-02-22 ENCOUNTER — Other Ambulatory Visit: Payer: Self-pay

## 2021-02-22 DIAGNOSIS — Z111 Encounter for screening for respiratory tuberculosis: Secondary | ICD-10-CM

## 2021-02-22 NOTE — Progress Notes (Signed)
Patient is here for a PPD placement.  PPD placed in left forearm @ 0930 am/pm.  Patient will return 02/24/2021 to have PPD read. Veronda Prude, RN

## 2021-02-24 ENCOUNTER — Ambulatory Visit (INDEPENDENT_AMBULATORY_CARE_PROVIDER_SITE_OTHER): Payer: Medicaid Other

## 2021-02-24 ENCOUNTER — Other Ambulatory Visit: Payer: Self-pay

## 2021-02-24 DIAGNOSIS — Z111 Encounter for screening for respiratory tuberculosis: Secondary | ICD-10-CM

## 2021-02-24 LAB — TB SKIN TEST
Induration: 0 mm
TB Skin Test: NEGATIVE

## 2021-02-24 NOTE — Progress Notes (Signed)
Patient is here for a PPD read.  It was placed on 02/22/2021 in the left forearm @ 0930 am.    PPD RESULTS:  Result: negative Induration: 0 mm  Letter created and given to patient for documentation purposes. Veronda Prude, RN

## 2021-04-08 ENCOUNTER — Other Ambulatory Visit: Payer: Self-pay | Admitting: Obstetrics and Gynecology

## 2021-04-20 ENCOUNTER — Other Ambulatory Visit: Payer: Self-pay

## 2021-04-26 ENCOUNTER — Other Ambulatory Visit: Payer: Self-pay

## 2021-05-03 MED ORDER — VALACYCLOVIR HCL 500 MG PO TABS: 500.0000 mg | ORAL_TABLET | Freq: Two times a day (BID) | ORAL | 0 refills | Status: AC

## 2021-10-10 ENCOUNTER — Telehealth: Payer: Medicaid Other | Admitting: Family Medicine

## 2021-10-10 DIAGNOSIS — K921 Melena: Secondary | ICD-10-CM

## 2021-10-10 NOTE — Progress Notes (Signed)
[place]  Reports bloody stool- face to face in person recommended

## 2021-10-11 ENCOUNTER — Other Ambulatory Visit: Payer: Self-pay

## 2021-10-11 ENCOUNTER — Ambulatory Visit
Admission: RE | Admit: 2021-10-11 | Discharge: 2021-10-11 | Disposition: A | Discharge status: Home / Self Care | Payer: 59 | Source: Ambulatory Visit | Attending: Emergency Medicine | Admitting: Emergency Medicine

## 2021-10-11 ENCOUNTER — Ambulatory Visit (HOSPITAL_BASED_OUTPATIENT_CLINIC_OR_DEPARTMENT_OTHER)
Admission: RE | Admit: 2021-10-11 | Discharge: 2021-10-11 | Disposition: A | Discharge status: Home / Self Care | Payer: 59 | Source: Ambulatory Visit | Attending: Diagnostic Radiology | Admitting: Diagnostic Radiology

## 2021-10-11 VITALS — BP 131/82 | HR 78 | Temp 98.2°F | Resp 20

## 2021-10-11 DIAGNOSIS — Z8739 Personal history of other diseases of the musculoskeletal system and connective tissue: Secondary | ICD-10-CM | POA: Insufficient documentation

## 2021-10-11 DIAGNOSIS — K649 Unspecified hemorrhoids: Secondary | ICD-10-CM

## 2021-10-11 DIAGNOSIS — K5904 Chronic idiopathic constipation: Secondary | ICD-10-CM

## 2021-10-11 DIAGNOSIS — M5137 Other intervertebral disc degeneration, lumbosacral region: Secondary | ICD-10-CM | POA: Diagnosis not present

## 2021-10-11 DIAGNOSIS — M5440 Lumbago with sciatica, unspecified side: Secondary | ICD-10-CM

## 2021-10-11 DIAGNOSIS — M545 Low back pain, unspecified: Secondary | ICD-10-CM | POA: Insufficient documentation

## 2021-10-11 DIAGNOSIS — M51379 Other intervertebral disc degeneration, lumbosacral region without mention of lumbar back pain or lower extremity pain: Secondary | ICD-10-CM

## 2021-10-11 DIAGNOSIS — G8929 Other chronic pain: Secondary | ICD-10-CM

## 2021-10-11 DIAGNOSIS — K625 Hemorrhage of anus and rectum: Secondary | ICD-10-CM

## 2021-10-11 MED ORDER — BACLOFEN 10 MG PO TABS: 10.0000 mg | ORAL_TABLET | Freq: Every day | ORAL | 0 refills | Status: AC

## 2021-10-11 MED ORDER — KETOROLAC TROMETHAMINE 60 MG/2ML IM SOLN
60.0000 mg | Freq: Once | INTRAMUSCULAR | Status: AC
  Administered 2021-10-11: 60 mg via INTRAMUSCULAR

## 2021-10-11 MED ORDER — POLYETHYLENE GLYCOL 3350 17 G PO PACK: 17.0000 g | PACK | Freq: Every day | ORAL | 0 refills | Status: AC

## 2021-10-11 MED ORDER — HYDROCORTISONE (PERIANAL) 2.5 % EX CREA: 1.0000 "application " | TOPICAL_CREAM | Freq: Two times a day (BID) | CUTANEOUS | 0 refills | Status: AC

## 2021-10-11 MED ORDER — MELOXICAM 15 MG PO TBDP: 1.0000 | ORAL_TABLET | Freq: Every day | ORAL | 0 refills | Status: AC

## 2021-10-11 NOTE — ED Provider Notes (Signed)
[facility]    CSN: [id number] Arrival date & time: 10/11/21  4098      History   Chief Complaint No chief complaint on file.   HPI Tiffany Reid is a 45 y.o. female.   Patient states for the past week and a half she has been having bright red blood after a bowel movement, this occurred 3 times.  Patient denies rectal pain, states she noticed the blood was in the stool as well as on the tissue after she wiped.  Patient states is never happened before.  Patient denies a history of constipation, states her stools vary from large long and soft to wet.    Patient also was a history of chronic lower back pain.  Patient states she has been going to physical therapy for this but had to stop going because she felt like it was making it worse.  Patient states she has had imaging done as recently as January, patient states she knows that she has 2 slipped disc in her lower back.  Patient states she occasionally has pain radiating down her left leg but sometimes on the right.  Patient states currently she uses Salonpas patches, lidocaine patches and Voltaren gel for her lower back pain.  Patient states she been prescribed gabapentin in the past which was entirely of no benefit.  Pt reports having an Evisit with a nurse practitioner yesterday who told to come here to be seen. Patient denies having dizziness or lightheadedness.    The history is provided by the patient.   Past Medical History:  Diagnosis Date   Allergy    Anemia    Arthritis    Chronic pain    Genital herpes    GERD (gastroesophageal reflux disease) 09/17/2017   H/O shoulder dystocia in prior pregnancy, currently pregnant 01/09/2020   With brachial plexus injury   History of gestational diabetes 03/22/2019   Requiring insulin in 2020 2 hour GTT postpartum  Also in 2021, had GDM > Normal postpartum 2 hr GTT   Neuromuscular disorder (HCC)    Neuropathy    Preeclampsia    Spinal headache     Patient  Active Problem List   Diagnosis Date Noted   Degenerative disc disease at L5-S1 level 12/22/2020   Chronic low back pain 12/20/2020   Tenosynovitis, de Quervain 04/06/2020   Tongue lesion 04/06/2020   Tension type headache 04/06/2020   History of gestational diabetes 03/22/2019    Past Surgical History:  Procedure Laterality Date   CESAREAN SECTION WITH BILATERAL TUBAL LIGATION Bilateral 01/08/2020   Procedure: CESAREAN SECTION WITH BILATERAL TUBAL LIGATION;  Surgeon: Allie Bossier, MD;  Location: [facility];  Service: Obstetrics;  Laterality: Bilateral;   DILATION AND CURETTAGE OF UTERUS      OB History     Gravida  7   Para  4   Term  3   Preterm  1   AB  3   Living  4      SAB  3   IAB      Ectopic      Multiple  0   Live Births  4            Home Medications    Prior to Admission medications   Medication Sig Start Date End Date Taking? Authorizing Provider  baclofen (LIORESAL) 10 MG tablet Take 1 tablet (10 mg total) by mouth at bedtime for 14 days. 10/11/21 10/25/21 Yes Theadora Rama Scales,  PA-C  hydrocortisone (PROCTOZONE-HC) 2.5 % rectal cream Place 1 application rectally 2 (two) times daily. 10/11/21  Yes Theadora Rama Scales, PA-C  Meloxicam 15 MG TBDP Take 1 tablet by mouth daily. 10/11/21 11/10/21 Yes Theadora Rama Scales, PA-C  polyethylene glycol (MIRALAX) 17 g packet Take 17 g by mouth daily. 10/11/21 11/10/21 Yes Theadora Rama Scales, PA-C  acetaminophen (TYLENOL) 500 MG tablet Take 500 mg by mouth every 4 (four) hours as needed for mild pain or headache.    [provider]  ibuprofen (ADVIL) 800 MG tablet Take 1 tablet (800 mg total) by mouth every 8 (eight) hours as needed. 01/11/20   Sparacino, Hailey L, DO  valACYclovir (VALTREX) 500 MG tablet Take 1 tablet (500 mg total) by mouth 2 (two) times daily. 05/03/21   Reva Bores, MD    Family History Family History  Problem Relation Age of Onset   Diabetes Mother    Miscarriages /  [place] Mother    Obesity Mother    Kidney disease Father    Obesity Father    Diabetes Father    Heart disease Paternal Grandmother    Hypertension Paternal Grandmother    Obesity Paternal Grandmother    Cancer Sister    ADD / ADHD Daughter    Asthma Daughter    Obesity Maternal Aunt    Obesity Maternal Uncle    Obesity Paternal Aunt    Obesity Paternal Uncle    Arthritis Maternal Grandmother    Diabetes Maternal Grandmother    Obesity Maternal Grandmother    Varicose Veins Maternal Grandmother    Arthritis Maternal Grandfather    Obesity Maternal Grandfather    Obesity Paternal Grandfather     Social History Social History   Tobacco Use   Smoking status: Never   Smokeless tobacco: Never  Vaping Use   Vaping Use: Never used  Substance Use Topics   Alcohol use: Not Currently    Alcohol/week: 2.0 standard drinks    Types: 2 Glasses of wine per week    Comment: socially   Drug use: No     Allergies   Penicillins   Review of Systems Review of Systems Pertinent findings noted in history of present illness.    Physical Exam Triage Vital Signs ED Triage Vitals  Enc Vitals Group     BP 09/29/21 0827 (!) 147/82     Pulse Rate 09/29/21 0827 72     Resp 09/29/21 0827 18     Temp 09/29/21 0827 98.3 F (36.8 C)     Temp Source 09/29/21 0827 Oral     SpO2 09/29/21 0827 98 %     Weight --      Height --      Head Circumference --      Peak Flow --      Pain Score 09/29/21 0826 5     Pain Loc --      Pain Edu? --      Excl. in GC? --    No data found.  Updated Vital Signs BP 131/82 (BP Location: Right Arm)   Pulse 78   Temp 98.2 F (36.8 C) (Oral)   Resp 20   LMP 10/10/2021   SpO2 98%   Visual Acuity Right Eye Distance:   Left Eye Distance:   Bilateral Distance:    Right Eye Near:   Left Eye Near:    Bilateral Near:     Physical Exam Vitals and nursing note reviewed.  Constitutional:  General: She is not in acute distress.     Appearance: Normal appearance. She is not ill-appearing.  HENT:     Head: Normocephalic and atraumatic.  Eyes:     General: Lids are normal.        Right eye: No discharge.        Left eye: No discharge.     Extraocular Movements: Extraocular movements intact.     Conjunctiva/sclera: Conjunctivae normal.     Right eye: Right conjunctiva is not injected.     Left eye: Left conjunctiva is not injected.  Neck:     Trachea: Trachea and phonation normal.  Cardiovascular:     Rate and Rhythm: Normal rate and regular rhythm.     Pulses: Normal pulses.     Heart sounds: Normal heart sounds. No murmur heard.   No friction rub. No gallop.  Pulmonary:     Effort: Pulmonary effort is normal. No accessory muscle usage, prolonged expiration or respiratory distress.     Breath sounds: Normal breath sounds. No stridor, decreased air movement or transmitted upper airway sounds. No decreased breath sounds, wheezing, rhonchi or rales.  Chest:     Chest wall: No tenderness.  Abdominal:     General: Abdomen is flat. Bowel sounds are normal. There is no distension.     Palpations: Abdomen is soft. There is no mass.     Tenderness: There is no abdominal tenderness. There is no right CVA tenderness, left CVA tenderness, guarding or rebound.     Hernia: No hernia is present.  Musculoskeletal:        General: Tenderness (Redness and spasm of lumbar paraspinous muscles) present. Normal range of motion.     Cervical back: Normal range of motion and neck supple. Normal range of motion.     Comments: Lumbar paraspinous muscles diffusely tender and in spasm  Lymphadenopathy:     Cervical: No cervical adenopathy.  Skin:    General: Skin is warm and dry.     Findings: No erythema or rash.  Neurological:     General: No focal deficit present.     Mental Status: She is alert and oriented to person, place, and time.  Psychiatric:        Mood and Affect: Mood normal.        Behavior: Behavior normal.     UC  Treatments / Results  Labs (all labs ordered are listed, but only abnormal results are displayed) Labs Reviewed - No data to display  EKG   Radiology DG Lumbar Spine Complete  Result Date: 10/11/2021 CLINICAL DATA:  worsening lower back pain EXAM: LUMBAR SPINE - COMPLETE 4+ VIEW COMPARISON:  None. FINDINGS: Normal alignment of lumbar vertebral bodies. No loss of vertebral body height or disc height. No pars fracture. No subluxation. IMPRESSION: Normal lumbar radiographs Electronically Signed   By: Genevive Bi M.D.   On: 10/11/2021 10:11    Procedures Procedures (including critical care time)  Medications Ordered in UC Medications  ketorolac (TORADOL) injection 60 mg (60 mg Intramuscular Given 10/11/21 0940)    Initial Impression / Assessment and Plan / UC Course  I have reviewed the triage vital signs and the nursing notes.  Pertinent labs & imaging results that were available during my care of the patient were reviewed by me and considered in my medical decision making (see chart for details).     Patient advised to have x-ray of lumbar spine performed.  Patient also advised that colonoscopy is  recommended given her complaint of bright red blood in her stool.  I will treat her empirically for presumed hemorrhoids until she is able to follow-up with her primary care provider.  Have also given her some MiraLAX to help her stools remain soft so she can avoid straining with bowel movements.  Patient was provided with an injection of ketorolac for temporary relief of her pain so that she can go to work today.  I provided her with a prescription for meloxicam and baclofen for her muscle spasm and pain in her paraspinous muscles.  Patient verbalized understanding and agreement of plan as discussed.  All questions were addressed during visit.  Please see discharge instructions below for further details of plan.  Final Clinical Impressions(s) / UC Diagnoses   Final diagnoses:   Hemorrhoids, unspecified hemorrhoid type  Chronic midline low back pain with sciatica, sciatica laterality unspecified  BRBPR (bright red blood per rectum)  Degenerative disc disease at L5-S1 level     Discharge Instructions      For presumed internal hemorrhoids, please begin using proctozone cream as directed twice daily and also after every bowel movement.  Begin MiraLAX to help your bowels move easily while you are treating your hemorrhoids, take 1 packet mixed in 8 ounces of fluid every day in the morning.  Whenever you have bright red blood in your stool, is always important that you have a colonoscopy done.  This can be ordered by your regular provider.  I provided you with an injection of ketorolac for your lower back pain to help you get through your work shift today.  Because you have 2 slipped disks in your lower back and physical therapy is no longer effective, you may wish to speak with your provider about referral to either back surgeon or interventional pain management.  For relief of muscle spasm in your lower back, I provided you with a prescription for baclofen to take for a few weeks while you are waiting for your appointment with your provider.  For anti-inflammatory pain relief, I have provided you with a prescription of a once daily anti-inflammatory, meloxicam, this is similar to medications like ibuprofen and naproxen so please do not take those medicines with a prescription on the same day.  Please go to the [facility] location to have your x-ray of your lower back done, your x-rays been ordered, just go in the main entrance and let them know you have the x-ray ordered, they should get you taken care of right away.  I have notified your primary care provider that you were seen here today and the reasons you were seen, hopefully you will get an appointment fairly soon with them to follow-up on your progressively worsening lower back pain and recent onset of blood  in your stool.     ED Prescriptions     Medication Sig Dispense Auth. Provider   hydrocortisone (PROCTOZONE-HC) 2.5 % rectal cream Place 1 application rectally 2 (two) times daily. 30 g Theadora Rama Scales, PA-C   polyethylene glycol (MIRALAX) 17 g packet Take 17 g by mouth daily. 30 packet Theadora Rama Scales, PA-C   baclofen (LIORESAL) 10 MG tablet Take 1 tablet (10 mg total) by mouth at bedtime for 14 days. 14 tablet Theadora Rama Scales, PA-C   Meloxicam 15 MG TBDP Take 1 tablet by mouth daily. 30 tablet Theadora Rama Scales, PA-C      PDMP not reviewed this encounter.   Disposition Upon Discharge:   The  patient will follow up with their current PCP if and as advised. If the patient does not currently have a PCP we will assist them in obtaining one.   The patient may need specialty follow up if the symptoms continue, in spite of conservative treatment and management, for further workup, evaluation, consultation and treatment as clinically indicated and appropriate.  Routine symptom specific, illness specific and/or disease specific instructions were discussed with the patient and/or caregiver at length; Patient presented with an acute illness with associated systemic symptoms and significant discomfort requiring acute urgent management. In my opinion, this is a condition that a prudent lay person (someone who possesses an average knowledge of health and medicine) may potentially expect to result in serious jeopardy, cause serious impairment of bodily function or result in serious dysfunction of bodily organs. As such, the patient has been evaluated and assessed; workup and treatment have begun; but the patient may require follow up for further testing and treatment if the symptoms continue in spite of treatment, as clinically indicated and appropriate.  If patient was tested for COVID-19, Influenza and/or RSV, the patient/parent/guardian was advised to isolate at home pending  the results of his/her diagnostic coronavirus test and potentially longer if they're positive. Advised pt that if his/her COVID-19 test returns positive, it's recommended to self-isolate for at least 10 days after symptoms first appeared AND until fever-free for 24 hours without fever reducer AND other symptoms have improved or resolved. Discussed self-isolation recommendations as well as instructions for household member/close contacts as per the [facility] and [place] DHHS, and also gave patient the COVID packet with this information.  Return to the [facility] or PCP in 3-5 days if no better; to PCP or the Emergency Department if new signs and symptoms develop, or if the current signs or symptoms continue to change or worsen for further workup, evaluation and treatment as clinically indicated and appropriate  Condition: stable for discharge home Home: take medications as prescribed; routine discharge instructions as discussed; follow up as advised.    Theadora Rama Scales, PA-C 10/11/21 1222

## 2021-10-11 NOTE — Discharge Instructions (Addendum)
For presumed internal hemorrhoids, please begin using proctozone cream as directed twice daily and also after every bowel movement.  Begin MiraLAX to help your bowels move easily while you are treating your hemorrhoids, take 1 packet mixed in 8 ounces of fluid every day in the morning.  Whenever you have bright red blood in your stool, is always important that you have a colonoscopy done.  This can be ordered by your regular provider.  I provided you with an injection of ketorolac for your lower back pain to help you get through your work shift today.  Because you have 2 slipped disks in your lower back and physical therapy is no longer effective, you may wish to speak with your provider about referral to either back surgeon or interventional pain management.  For relief of muscle spasm in your lower back, I provided you with a prescription for baclofen to take for a few weeks while you are waiting for your appointment with your provider.  For anti-inflammatory pain relief, I have provided you with a prescription of a once daily anti-inflammatory, meloxicam, this is similar to medications like ibuprofen and naproxen so please do not take those medicines with a prescription on the same day.  Please go to the [facility] location to have your x-ray of your lower back done, your x-rays been ordered, just go in the main entrance and let them know you have the x-ray ordered, they should get you taken care of right away.  I have notified your primary care provider that you were seen here today and the reasons you were seen, hopefully you will get an appointment fairly soon with them to follow-up on your progressively worsening lower back pain and recent onset of blood in your stool.

## 2021-10-11 NOTE — ED Triage Notes (Signed)
Pt reports having an Evisit yesterday and was told to come here. Patient states for the past week and a half she has been having bright red blood after a bowel movement. She reports having chronic lower back pain that has been worse. Patient denies having dizziness or lightheadedness.

## 2021-10-11 NOTE — Progress Notes (Signed)
Patient was seen in urgent care today, I recommended x-ray lumbar spine given her worsening symptoms now not respond to physical therapy.

## 2022-02-22 ENCOUNTER — Other Ambulatory Visit: Payer: Self-pay

## 2022-02-22 ENCOUNTER — Ambulatory Visit (INDEPENDENT_AMBULATORY_CARE_PROVIDER_SITE_OTHER): Payer: 59 | Admitting: Adult Health

## 2022-02-22 ENCOUNTER — Encounter: Payer: Self-pay | Admitting: Adult Health

## 2022-02-22 DIAGNOSIS — R519 Headache, unspecified: Secondary | ICD-10-CM

## 2022-02-22 DIAGNOSIS — G8929 Other chronic pain: Secondary | ICD-10-CM | POA: Diagnosis not present

## 2022-02-22 DIAGNOSIS — R400 Somnolence: Secondary | ICD-10-CM

## 2022-02-22 NOTE — Assessment & Plan Note (Signed)
Healthy weight loss discussed 

## 2022-02-22 NOTE — Assessment & Plan Note (Signed)
Chronic daily headaches questionable etiology.  Could be related to underlying sleep disorder.  Home sleep study is pending.  Helpful hints discussed.  Healthy sleep regimen discussed ?Continue follow-up with primary care for ongoing management ? ?Plan  ?Patient Instructions  ?Set up for home sleep study  ?Work on healthy weight loss .  ?Do not drive if sleepy  ?Follow up in 6 weeks to discuss results and treatment plan .  ?  ? ? ?

## 2022-02-22 NOTE — Assessment & Plan Note (Signed)
Daytime sleepiness, snoring, BMI 35, restless sleep all suspicious for underlying sleep apnea. ?We will set up for a home sleep study.  Patient education was given on sleep apnea and potential complications of untreated sleep apnea ?- discussed how weight can impact sleep and risk for sleep disordered breathing ?- discussed options to assist with weight loss: combination of diet modification, cardiovascular and strength training exercises ?  ?- had an extensive discussion regarding the adverse health consequences related to untreated sleep disordered breathing ?- specifically discussed the risks for hypertension, coronary artery disease, cardiac dysrhythmias, cerebrovascular disease, and diabetes ?- lifestyle modification discussed ?  ?- discussed how sleep disruption can increase risk of accidents, particularly when driving ?- safe driving practices were discussed ?  ?Plan  ?Patient Instructions  ?Set up for home sleep study  ?Work on healthy weight loss .  ?Do not drive if sleepy  ?Follow up in 6 weeks to discuss results and treatment plan .  ?  ? ? ?

## 2022-02-22 NOTE — Progress Notes (Signed)
? ?@Patient  ID: , female    DOB: 11-30-1976, 46 y.o.   MRN: [id number] ? ?Chief Complaint  ?Patient presents with  ? Consult  ? ? ?Referring provider: ?[id number], NP ? ?HPI: ?46 year old female presents for sleep consult February 22, 2022 for daytime sleepiness, snoring and restless sleep ?Medical history significant for obesity, history of gestational diabetes, hypertension, chronic headaches ? ? ?TEST/EVENTS :  ? ?02/22/2022 Sleep consult  ?Patient presents for sleep consult.  Kindly referred by primary care provider 02/24/2022 NP .  Patient complains of daytime sleepiness, snoring, restless sleep over the last several years.   Typically goes to bed about 9 to 10 PM.  Takes about 30 minutes to go to sleep.  Is up several times at night.  Gets up about 7:30 AM.  Patient does not operate heavy machinery.  Weight has been up and down for the last 2 years.  She has never had a formal sleep study before. Complains that she wakes up daily morning headaches for last few months. Wakes herself up snoring . Wakes up unrefreshed and like she did not sleep at all. Tired all through the day. Husband says she will doze off talking to him at night .  ?Caffeine intake 1-2 cups daily . Occasional energy drink.  ?Does not take melatonin,  ?Epworth score is 12.  Mainly symptoms are increased with an activity such as sitting reading or watching TV. ?No symptoms suspicious for cataplexy.  ?Current weight is 241 pounds with a BMI at 35  ?Does not take pain meds or muscle relaxers lately .  ?Wants to have more energy to play with her young kids.  ? ?Surgical history is positive for  C Section , tubal ligation January 08, 2020 ? ?Social history patient is married.  Lives with her husband and children at home.  She is a January 10, 2020.  Drinks alcohol socially.  Never smoker.  No drug use. Kids age 46 and 3.  ? ?Family history positive for allergies, heart disease, rheumatoid arthritis, cancer ? ?Medical  history is significant for gestational diabetes, hypertension (gestational)  obesity, chronic headaches , GERD, Chronic back pain from DDD ,  ? ? ?Allergies  ?Allergen Reactions  ? Penicillins Itching and Swelling  ?  Did it involve swelling of the face/tongue/throat, SOB, or low BP? Yes ?Did it involve sudden or severe rash/hives, skin peeling, or any reaction on the inside of your mouth or nose? Yes ?Did you need to seek medical attention at a hospital or doctor's office? Yes ?When did it last happen? 46 years old      ?If all above answers are ?NO?, may proceed with cephalosporin use. ?  ? ? ?Immunization History  ?Administered Date(s) Administered  ? Influenza,inj,Quad PF,6+ Mos 11/03/2018, 08/12/2019  ? PFIZER(Purple Top)SARS-COV-2 Vaccination 07/05/2020, 08/03/2020  ? PPD Test 02/22/2021  ? Tdap 09/17/2017, 12/03/2017, 01/26/2019, 01/07/2020  ? ? ?Past Medical History:  ?Diagnosis Date  ? Allergy   ? Anemia   ? Arthritis   ? Chronic pain   ? Genital herpes   ? GERD (gastroesophageal reflux disease) 09/17/2017  ? H/O shoulder dystocia in prior pregnancy, currently pregnant 01/09/2020  ? With brachial plexus injury  ? History of gestational diabetes 03/22/2019  ? Requiring insulin in 2020 2 hour GTT postpartum  Also in 2021, had GDM > Normal postpartum 2 hr GTT  ? Neuromuscular disorder (HCC)   ? Neuropathy   ? Preeclampsia   ? Spinal  headache   ? ? ?Tobacco History: ?Social History  ? ?Tobacco Use  ?Smoking Status Never  ?Smokeless Tobacco Never  ? ?Counseling given: Not Answered ? ? ?Outpatient Medications Prior to Visit  ?Medication Sig Dispense Refill  ? acetaminophen (TYLENOL) 500 MG tablet Take 500 mg by mouth every 4 (four) hours as needed for mild pain or headache.    ? hydrocortisone (PROCTOZONE-HC) 2.5 % rectal cream Place 1 application rectally 2 (two) times daily. 30 g 0  ? ibuprofen (ADVIL) 800 MG tablet Take 1 tablet (800 mg total) by mouth every 8 (eight) hours as needed. 30 tablet 0  ? Multiple  Vitamin (MULTIVITAMIN ADULT PO) Take 1 tablet by mouth daily at 6 (six) AM.    ? valACYclovir (VALTREX) 500 MG tablet Take 1 tablet (500 mg total) by mouth 2 (two) times daily. 60 tablet 0  ? Vitamin D, Ergocalciferol, (DRISDOL) 1.25 MG (50000 UNIT) CAPS capsule Take 50,000 Units by mouth once a week.    ? ?No facility-administered medications prior to visit.  ? ? ? ?Review of Systems:  ? ?Constitutional:   No  weight loss, night sweats,  Fevers, chills, fatigue, or  lassitude. ? ?HEENT:   No headaches,  Difficulty swallowing,  Tooth/dental problems, or  Sore throat,  ?              No sneezing, itching, ear ache, nasal congestion, post nasal drip,  ? ?CV:  No chest pain,  Orthopnea, PND, swelling in lower extremities, anasarca, dizziness, palpitations, syncope.  ? ?GI  No heartburn, indigestion, abdominal pain, nausea, vomiting, diarrhea, change in bowel habits, loss of appetite, bloody stools.  ? ?Resp: No shortness of breath with exertion or at rest.  No excess mucus, no productive cough,  No non-productive cough,  No coughing up of blood.  No change in color of mucus.  No wheezing.  No chest wall deformity ? ?Skin: no rash or lesions. ? ?GU: no dysuria, change in color of urine, no urgency or frequency.  No flank pain, no hematuria  ? ?MS:  No joint pain or swelling.  No decreased range of motion.  No back pain. ? ? ? ?Physical Exam ? ?BP 110/70 (BP Location: Left Arm, Patient Position: Sitting, Cuff Size: Large)   Pulse 94   Temp 98.4 ?F (36.9 ?C) (Oral)   Ht  (1.753 m)   Wt 241 lb 9.6 oz (109.6 kg)   SpO2 100%   BMI 35.68 kg/m?  ? ?GEN: A/Ox3; pleasant , NAD, well nourished  ?  ?HEENT:  [place]/AT,  NOSE-clear, THROAT-clear, no lesions, no postnasal drip or exudate noted.  Class III MP airway, poor dentition ? ?NECK:  Supple w/ fair ROM; no JVD; normal carotid impulses w/o bruits; no thyromegaly or nodules palpated; no lymphadenopathy.   ? ?RESP  Clear  P & A; w/o, wheezes/ rales/ or rhonchi. no  accessory muscle use, no dullness to percussion ? ?CARD:  RRR, no m/r/g, no peripheral edema, pulses intact, no cyanosis or clubbing. ? ?GI:   Soft & nt; nml bowel sounds; no organomegaly or masses detected.  ? ?Musco: Warm bil, no deformities or joint swelling noted.  ? ?Neuro: alert, no focal deficits noted.   ? ?Skin: Warm, no lesions or rashes ? ? ? ?Lab Results: ? ?CBC ? ? ?BMET ? ? ?BNP ?No results found for: BNP ? ?ProBNP ?No results found for: PROBNP ? ?Imaging: ?No results found. ? ? ? ? ? ?No results  found for: NITRICOXIDE ? ? ? ? ? ?Assessment & Plan:  ? ?Daytime sleepiness ?Daytime sleepiness, snoring, BMI 35, restless sleep all suspicious for underlying sleep apnea. ?We will set up for a home sleep study.  Patient education was given on sleep apnea and potential complications of untreated sleep apnea ?- discussed how weight can impact sleep and risk for sleep disordered breathing ?- discussed options to assist with weight loss: combination of diet modification, cardiovascular and strength training exercises ?  ?- had an extensive discussion regarding the adverse health consequences related to untreated sleep disordered breathing ?- specifically discussed the risks for hypertension, coronary artery disease, cardiac dysrhythmias, cerebrovascular disease, and diabetes ?- lifestyle modification discussed ?  ?- discussed how sleep disruption can increase risk of accidents, particularly when driving ?- safe driving practices were discussed ?  ?Plan  ?Patient Instructions  ?Set up for home sleep study  ?Work on healthy weight loss .  ?Do not drive if sleepy  ?Follow up in 6 weeks to discuss results and treatment plan .  ?  ? ? ? ?Chronic headaches ?Chronic daily headaches questionable etiology.  Could be related to underlying sleep disorder.  Home sleep study is pending.  Helpful hints discussed.  Healthy sleep regimen discussed ?Continue follow-up with primary care for ongoing management ? ?Plan  ?Patient  Instructions  ?Set up for home sleep study  ?Work on healthy weight loss .  ?Do not drive if sleepy  ?Follow up in 6 weeks to discuss results and treatment plan .  ?  ? ? ? ?Morbid obesity (HCC) ?Healthy weight loss disc

## 2022-02-22 NOTE — Patient Instructions (Signed)
Set up for home sleep study  ?Work on healthy weight loss .  ?Do not drive if sleepy  ?Follow up in 6 weeks to discuss results and treatment plan .  ?

## 2022-02-24 NOTE — Progress Notes (Signed)
Reviewed and agree with assessment/plan. ? ? ?Dezyre Hoefer, MD ?[facility] ?02/24/2022, 7:09 AM ?Pager:  [phone] ? ?

## 2022-04-26 ENCOUNTER — Ambulatory Visit: Payer: 59

## 2022-04-26 DIAGNOSIS — R400 Somnolence: Secondary | ICD-10-CM

## 2022-04-26 DIAGNOSIS — G4733 Obstructive sleep apnea (adult) (pediatric): Secondary | ICD-10-CM | POA: Diagnosis not present

## 2022-05-08 ENCOUNTER — Encounter: Payer: Self-pay | Admitting: *Deleted

## 2022-05-09 DIAGNOSIS — G4733 Obstructive sleep apnea (adult) (pediatric): Secondary | ICD-10-CM | POA: Diagnosis not present

## 2022-06-25 ENCOUNTER — Telehealth: Payer: Self-pay | Admitting: Adult Health

## 2022-06-25 NOTE — Telephone Encounter (Signed)
I called and spoke with the pt and scheduled appt with Tammy for 2 pm tomorrow (video)

## 2022-06-25 NOTE — Telephone Encounter (Signed)
Sleep study done on Apr 27, 2022 showed severe sleep apnea with AHI at 72.4/hour and SPO2 low at 84%. Please set up office visit to discuss sleep study results and treatment plan please see if she can do a visit tomorrow either in person or virtually and can be double booked on my schedule

## 2022-06-25 NOTE — Telephone Encounter (Signed)
Called patient back and she was asking about a cpap machine. She states that she has not heard from any DME company.   Tammy patient had home sleep study done last month. Can you look at the report to determine if patient needs cpap or if you want her to set up an office visit with you first?  Please advise

## 2022-06-26 ENCOUNTER — Encounter: Payer: Self-pay | Admitting: Adult Health

## 2022-06-26 ENCOUNTER — Telehealth (INDEPENDENT_AMBULATORY_CARE_PROVIDER_SITE_OTHER): Payer: 59 | Admitting: Adult Health

## 2022-06-26 DIAGNOSIS — G4733 Obstructive sleep apnea (adult) (pediatric): Secondary | ICD-10-CM | POA: Diagnosis not present

## 2022-06-26 HISTORY — DX: Obstructive sleep apnea (adult) (pediatric): G47.33

## 2022-06-26 NOTE — Patient Instructions (Signed)
Begin CPAP at bedtime, goal is to wear for more than 6 hours each night Do not drive if sleepy Work on healthy weight loss Healthy sleep regimen Follow-up in 3 months and as needed

## 2022-06-26 NOTE — Progress Notes (Signed)
Virtual Visit via Video Note  I connected with Tiffany Reid on 06/26/22 at  2:00 PM EDT by a video enabled telemedicine application and verified that I am speaking with the correct person using two identifiers.  Location: Patient: Home  Provider: Office    I discussed the limitations of evaluation and management by telemedicine and the availability of in person appointments. The patient expressed understanding and agreed to proceed.  History of Present Illness: 46 year old female seen for sleep consult February 22, 2022 for snoring daytime sleepiness and restless sleep.  She was set up for home sleep study that was done on Apr 27, 2022 that showed severe sleep apnea with AHI at 72.4/hour and SPO2 low at 84%. Today's video visit is to discuss her sleep study results.  Went over her sleep study results in detail.  Patient education was given on sleep apnea.  We went over treatment options.  Patient will will need to proceed with CPAP due to her severe sleep apnea.  Patient education on CPAP was given.  Past Medical History:  Diagnosis Date   Allergy    Anemia    Arthritis    Chronic pain    Genital herpes    GERD (gastroesophageal reflux disease) 09/17/2017   H/O shoulder dystocia in prior pregnancy, currently pregnant 01/09/2020   With brachial plexus injury   History of gestational diabetes 03/22/2019   Requiring insulin in 2020 2 hour GTT postpartum  Also in 2021, had GDM > Normal postpartum 2 hr GTT   Neuromuscular disorder (HCC)    Neuropathy    Preeclampsia    Spinal headache    Current Outpatient Medications on File Prior to Visit  Medication Sig Dispense Refill   acetaminophen (TYLENOL) 500 MG tablet Take 500 mg by mouth every 4 (four) hours as needed for mild pain or headache.     hydrocortisone (PROCTOZONE-HC) 2.5 % rectal cream Place 1 application rectally 2 (two) times daily. 30 g 0   ibuprofen (ADVIL) 800 MG tablet Take 1 tablet (800 mg total) by mouth every 8  (eight) hours as needed. 30 tablet 0   Multiple Vitamin (MULTIVITAMIN ADULT PO) Take 1 tablet by mouth daily at 6 (six) AM.     valACYclovir (VALTREX) 500 MG tablet Take 1 tablet (500 mg total) by mouth 2 (two) times daily. 60 tablet 0   Vitamin D, Ergocalciferol, (DRISDOL) 1.25 MG (50000 UNIT) CAPS capsule Take 50,000 Units by mouth once a week.     No current facility-administered medications on file prior to visit.       Observations/Objective:  Sleep study done on Apr 27, 2022 showed severe sleep apnea with AHI at 72.4/hour and SPO2 low at 84%.  Assessment and Plan: Severe obstructive sleep apnea.  Patient education on sleep apnea was given.  Treatment options including weight loss, CPAP therapy were discussed in detail.  Patient proceed with CPAP.  We will begin CPAP AutoSet 5 to 15 cm H2O.  - discussed how weight can impact sleep and risk for sleep disordered breathing - discussed options to assist with weight loss: combination of diet modification, cardiovascular and strength training exercises   - had an extensive discussion regarding the adverse health consequences related to untreated sleep disordered breathing - specifically discussed the risks for hypertension, coronary artery disease, cardiac dysrhythmias, cerebrovascular disease, and diabetes - lifestyle modification discussed   - discussed how sleep disruption can increase risk of accidents, particularly when driving - safe driving practices were discussed  Plan  Patient Instructions  Begin CPAP at bedtime, goal is to wear for more than 6 hours each night Do not drive if sleepy Work on healthy weight loss Healthy sleep regimen Follow-up in 3 months and as needed      Follow Up Instructions:    I discussed the assessment and treatment plan with the patient. The patient was provided an opportunity to ask questions and all were answered. The patient agreed with the plan and demonstrated an understanding of the  instructions.   The patient was advised to call back or seek an in-person evaluation if the symptoms worsen or if the condition fails to improve as anticipated.  I provided 22 minutes of non-face-to-face time during this encounter.   Rubye Oaks, NP

## 2022-06-26 NOTE — Addendum Note (Signed)
Addended by: Delrae Rend on: 06/26/2022 02:55 PM   Modules accepted: Orders

## 2022-09-27 ENCOUNTER — Ambulatory Visit: Payer: 59 | Admitting: Adult Health
